# Patient Record
Sex: Female | Born: 1985 | Race: White | Hispanic: No | Marital: Single | Smoking: Former smoker
Health system: Southern US, Community
[De-identification: ages and names within clinical notes are randomized; demographics above are authoritative.]

## PROBLEM LIST (undated history)

## (undated) DIAGNOSIS — I10 Essential (primary) hypertension: Secondary | ICD-10-CM

## (undated) DIAGNOSIS — E119 Type 2 diabetes mellitus without complications: Secondary | ICD-10-CM

## (undated) DIAGNOSIS — J45909 Unspecified asthma, uncomplicated: Secondary | ICD-10-CM

## (undated) DIAGNOSIS — E739 Lactose intolerance, unspecified: Secondary | ICD-10-CM

## (undated) HISTORY — PX: TONSILLECTOMY: SUR1361

## (undated) HISTORY — DX: Lactose intolerance, unspecified: E73.9

---
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
## 2007-04-04 ENCOUNTER — Emergency Department: Payer: Self-pay | Admitting: Unknown Physician Specialty

## 2007-05-04 ENCOUNTER — Emergency Department: Payer: Self-pay | Admitting: Emergency Medicine

## 2007-05-31 ENCOUNTER — Emergency Department: Payer: Self-pay | Admitting: Emergency Medicine

## 2007-09-17 ENCOUNTER — Emergency Department: Payer: Self-pay | Admitting: Emergency Medicine

## 2007-10-18 ENCOUNTER — Emergency Department: Payer: Self-pay | Admitting: Internal Medicine

## 2008-05-06 ENCOUNTER — Emergency Department: Payer: Self-pay | Admitting: Internal Medicine

## 2008-06-14 ENCOUNTER — Emergency Department: Payer: Self-pay | Admitting: Emergency Medicine

## 2008-07-29 ENCOUNTER — Emergency Department: Payer: Self-pay | Admitting: Emergency Medicine

## 2008-10-19 ENCOUNTER — Emergency Department: Payer: Self-pay | Admitting: Emergency Medicine

## 2008-12-04 ENCOUNTER — Emergency Department: Payer: Self-pay | Admitting: Emergency Medicine

## 2009-01-20 ENCOUNTER — Emergency Department: Payer: Self-pay | Admitting: Emergency Medicine

## 2009-02-10 ENCOUNTER — Emergency Department (HOSPITAL_COMMUNITY): Admission: EM | Admit: 2009-02-10 | Discharge: 2009-02-11 | Payer: Self-pay | Admitting: Emergency Medicine

## 2012-02-29 ENCOUNTER — Emergency Department: Payer: Self-pay | Admitting: Emergency Medicine

## 2012-12-26 DIAGNOSIS — A6000 Herpesviral infection of urogenital system, unspecified: Secondary | ICD-10-CM | POA: Insufficient documentation

## 2012-12-26 DIAGNOSIS — Z98891 History of uterine scar from previous surgery: Secondary | ICD-10-CM | POA: Insufficient documentation

## 2012-12-26 DIAGNOSIS — I10 Essential (primary) hypertension: Secondary | ICD-10-CM | POA: Insufficient documentation

## 2012-12-26 DIAGNOSIS — F32A Depression, unspecified: Secondary | ICD-10-CM | POA: Insufficient documentation

## 2012-12-26 DIAGNOSIS — Z6834 Body mass index (BMI) 34.0-34.9, adult: Secondary | ICD-10-CM | POA: Insufficient documentation

## 2013-04-30 ENCOUNTER — Emergency Department: Payer: Self-pay | Admitting: Emergency Medicine

## 2013-04-30 LAB — COMPREHENSIVE METABOLIC PANEL
Anion Gap: 6 — ABNORMAL LOW (ref 7–16)
Chloride: 100 mmol/L (ref 98–107)
EGFR (African American): >60
Glucose: 370 mg/dL — ABNORMAL HIGH (ref 65–99)
Osmolality: 281 (ref 275–301)
SGOT(AST): 21 U/L (ref 15–37)
Sodium: 133 mmol/L — ABNORMAL LOW (ref 136–145)

## 2013-04-30 LAB — URINALYSIS, COMPLETE
Bilirubin,UR: NEGATIVE
Leukocyte Esterase: NEGATIVE
Protein: 30
Specific Gravity: 1.032 (ref 1.003–1.030)

## 2013-04-30 LAB — CBC
HCT: 42.4 % (ref 35.0–47.0)
MCH: 25.7 pg — ABNORMAL LOW (ref 26.0–34.0)
MCHC: 33 g/dL (ref 32.0–36.0)
MCV: 78 fL — ABNORMAL LOW (ref 80–100)

## 2013-11-13 ENCOUNTER — Emergency Department: Payer: Self-pay | Admitting: Emergency Medicine

## 2013-11-13 LAB — COMPREHENSIVE METABOLIC PANEL
ALBUMIN: 3.6 g/dL (ref 3.4–5.0)
ALT: 24 U/L (ref 12–78)
AST: 18 U/L (ref 15–37)
Alkaline Phosphatase: 69 U/L
Anion Gap: 7 (ref 7–16)
BILIRUBIN TOTAL: 0.5 mg/dL (ref 0.2–1.0)
BUN: 8 mg/dL (ref 7–18)
CO2: 25 mmol/L (ref 21–32)
Calcium, Total: 8.7 mg/dL (ref 8.5–10.1)
Chloride: 102 mmol/L (ref 98–107)
Creatinine: 0.79 mg/dL (ref 0.60–1.30)
EGFR (African American): >60
EGFR (Non-African Amer.): >60
Glucose: 334 mg/dL — ABNORMAL HIGH (ref 65–99)
Osmolality: 280 (ref 275–301)
Potassium: 3.7 mmol/L (ref 3.5–5.1)
Sodium: 134 mmol/L — ABNORMAL LOW (ref 136–145)
Total Protein: 7.6 g/dL (ref 6.4–8.2)

## 2013-11-13 LAB — URINALYSIS, COMPLETE
BILIRUBIN, UR: NEGATIVE
Bacteria: NONE SEEN
KETONE: NEGATIVE
Leukocyte Esterase: NEGATIVE
Nitrite: NEGATIVE
PH: 6 (ref 4.5–8.0)
RBC,UR: 1 /HPF (ref 0–5)
SPECIFIC GRAVITY: 1.032 (ref 1.003–1.030)
Squamous Epithelial: 1

## 2013-11-13 LAB — CBC WITH DIFFERENTIAL/PLATELET
BASOS PCT: 0.5 %
Basophil #: 0 10*3/uL (ref 0.0–0.1)
EOS ABS: 0.1 10*3/uL (ref 0.0–0.7)
Eosinophil %: 1.2 %
HCT: 43.4 % (ref 35.0–47.0)
HGB: 14.3 g/dL (ref 12.0–16.0)
Lymphocyte #: 1.8 10*3/uL (ref 1.0–3.6)
Lymphocyte %: 28.6 %
MCH: 26.5 pg (ref 26.0–34.0)
MCHC: 32.9 g/dL (ref 32.0–36.0)
MCV: 81 fL (ref 80–100)
Monocyte #: 0.4 x10 3/mm (ref 0.2–0.9)
Monocyte %: 6 %
NEUTROS ABS: 4 10*3/uL (ref 1.4–6.5)
Neutrophil %: 63.7 %
PLATELETS: 234 10*3/uL (ref 150–440)
RBC: 5.39 10*6/uL — ABNORMAL HIGH (ref 3.80–5.20)
RDW: 13.7 % (ref 11.5–14.5)
WBC: 6.2 10*3/uL (ref 3.6–11.0)

## 2013-11-13 LAB — PREGNANCY, URINE: Pregnancy Test, Urine: NEGATIVE m[IU]/mL

## 2014-03-01 ENCOUNTER — Emergency Department: Payer: Self-pay | Admitting: Emergency Medicine

## 2014-03-02 LAB — CBC
HCT: 39.8 % (ref 35.0–47.0)
HGB: 13.2 g/dL (ref 12.0–16.0)
MCH: 27.1 pg (ref 26.0–34.0)
MCHC: 33.2 g/dL (ref 32.0–36.0)
MCV: 82 fL (ref 80–100)
PLATELETS: 215 10*3/uL (ref 150–440)
RBC: 4.88 10*6/uL (ref 3.80–5.20)
RDW: 12.9 % (ref 11.5–14.5)
WBC: 7.8 10*3/uL (ref 3.6–11.0)

## 2014-03-02 LAB — COMPREHENSIVE METABOLIC PANEL
ALBUMIN: 3.3 g/dL — AB (ref 3.4–5.0)
ANION GAP: 8 (ref 7–16)
Alkaline Phosphatase: 49 U/L
BUN: 7 mg/dL (ref 7–18)
Bilirubin,Total: 0.3 mg/dL (ref 0.2–1.0)
CHLORIDE: 99 mmol/L (ref 98–107)
CREATININE: 0.71 mg/dL (ref 0.60–1.30)
Calcium, Total: 8.6 mg/dL (ref 8.5–10.1)
Co2: 27 mmol/L (ref 21–32)
EGFR (African American): >60
EGFR (Non-African Amer.): >60
Glucose: 331 mg/dL — ABNORMAL HIGH (ref 65–99)
Osmolality: 279 (ref 275–301)
Potassium: 3.9 mmol/L (ref 3.5–5.1)
SGOT(AST): 14 U/L — ABNORMAL LOW (ref 15–37)
SGPT (ALT): 18 U/L (ref 12–78)
Sodium: 134 mmol/L — ABNORMAL LOW (ref 136–145)
TOTAL PROTEIN: 6.8 g/dL (ref 6.4–8.2)

## 2014-03-02 LAB — URINALYSIS, COMPLETE
BILIRUBIN, UR: NEGATIVE
BLOOD: NEGATIVE
Bacteria: NONE SEEN
Glucose,UR: >=500 mg/dL (ref 0–75)
KETONE: NEGATIVE
Leukocyte Esterase: NEGATIVE
NITRITE: NEGATIVE
PH: 6 (ref 4.5–8.0)
PROTEIN: NEGATIVE
Specific Gravity: 1.035 (ref 1.003–1.030)
WBC UR: 2 /HPF (ref 0–5)

## 2014-03-02 LAB — ETHANOL

## 2014-03-02 LAB — PREGNANCY, URINE: Pregnancy Test, Urine: NEGATIVE m[IU]/mL

## 2014-03-02 LAB — LIPASE, BLOOD: LIPASE: 118 U/L (ref 73–393)

## 2014-07-04 ENCOUNTER — Emergency Department: Payer: Self-pay | Admitting: Emergency Medicine

## 2014-07-04 LAB — URINALYSIS, COMPLETE
Bacteria: NONE SEEN
Bilirubin,UR: NEGATIVE
Blood: NEGATIVE
Glucose,UR: >=500 mg/dL (ref 0–75)
KETONE: NEGATIVE
NITRITE: NEGATIVE
PH: 6 (ref 4.5–8.0)
Protein: 30
Specific Gravity: 1.03 (ref 1.003–1.030)
Squamous Epithelial: 4
WBC UR: 34 /HPF (ref 0–5)

## 2014-07-04 LAB — COMPREHENSIVE METABOLIC PANEL
ALK PHOS: 58 U/L
ALT: 29 U/L
AST: 22 U/L (ref 15–37)
Albumin: 3.6 g/dL (ref 3.4–5.0)
Anion Gap: 10 (ref 7–16)
BILIRUBIN TOTAL: 0.4 mg/dL (ref 0.2–1.0)
BUN: 9 mg/dL (ref 7–18)
CALCIUM: 8.8 mg/dL (ref 8.5–10.1)
CHLORIDE: 99 mmol/L (ref 98–107)
CO2: 24 mmol/L (ref 21–32)
Creatinine: 0.81 mg/dL (ref 0.60–1.30)
EGFR (African American): >60
EGFR (Non-African Amer.): >60
GLUCOSE: 489 mg/dL — AB (ref 65–99)
Osmolality: 287 (ref 275–301)
POTASSIUM: 4.3 mmol/L (ref 3.5–5.1)
SODIUM: 133 mmol/L — AB (ref 136–145)
TOTAL PROTEIN: 7.3 g/dL (ref 6.4–8.2)

## 2014-07-04 LAB — PREGNANCY, URINE: PREGNANCY TEST, URINE: NEGATIVE m[IU]/mL

## 2014-07-04 LAB — CBC
HCT: 43.3 % (ref 35.0–47.0)
HGB: 14 g/dL (ref 12.0–16.0)
MCH: 26.7 pg (ref 26.0–34.0)
MCHC: 32.2 g/dL (ref 32.0–36.0)
MCV: 83 fL (ref 80–100)
Platelet: 248 10*3/uL (ref 150–440)
RBC: 5.23 10*6/uL — AB (ref 3.80–5.20)
RDW: 12.9 % (ref 11.5–14.5)
WBC: 9.1 10*3/uL (ref 3.6–11.0)

## 2014-07-06 LAB — URINE CULTURE

## 2014-07-17 ENCOUNTER — Emergency Department: Payer: Self-pay | Admitting: Internal Medicine

## 2014-10-06 ENCOUNTER — Emergency Department: Payer: Self-pay | Admitting: Emergency Medicine

## 2014-11-15 ENCOUNTER — Emergency Department: Payer: Self-pay | Admitting: Emergency Medicine

## 2014-11-15 ENCOUNTER — Emergency Department: Payer: Self-pay | Admitting: Student

## 2015-04-20 ENCOUNTER — Emergency Department
Admission: EM | Admit: 2015-04-20 | Discharge: 2015-04-20 | Disposition: A | Discharge status: Home / Self Care | Payer: Self-pay | Attending: Emergency Medicine | Admitting: Emergency Medicine

## 2015-04-20 ENCOUNTER — Encounter: Payer: Self-pay | Admitting: Emergency Medicine

## 2015-04-20 ENCOUNTER — Emergency Department: Payer: Self-pay

## 2015-04-20 DIAGNOSIS — Z880 Allergy status to penicillin: Secondary | ICD-10-CM | POA: Insufficient documentation

## 2015-04-20 DIAGNOSIS — E119 Type 2 diabetes mellitus without complications: Secondary | ICD-10-CM | POA: Insufficient documentation

## 2015-04-20 DIAGNOSIS — Z720 Tobacco use: Secondary | ICD-10-CM | POA: Insufficient documentation

## 2015-04-20 DIAGNOSIS — J209 Acute bronchitis, unspecified: Secondary | ICD-10-CM | POA: Insufficient documentation

## 2015-04-20 HISTORY — DX: Type 2 diabetes mellitus without complications: E11.9

## 2015-04-20 MED ORDER — GUAIFENESIN-CODEINE 300-10 MG/5ML PO LIQD: 10.0000 mL | Freq: Three times a day (TID) | ORAL | Status: DC | PRN

## 2015-04-20 MED ORDER — ALBUTEROL SULFATE HFA 108 (90 BASE) MCG/ACT IN AERS: 2.0000 | INHALATION_SPRAY | Freq: Four times a day (QID) | RESPIRATORY_TRACT | Status: DC | PRN

## 2015-04-20 MED ORDER — PREDNISONE 20 MG PO TABS
60.0000 mg | ORAL_TABLET | Freq: Once | ORAL | Status: AC
  Administered 2015-04-20: 60 mg via ORAL
  Filled 2015-04-20: qty 3

## 2015-04-20 MED ORDER — IPRATROPIUM-ALBUTEROL 0.5-2.5 (3) MG/3ML IN SOLN
3.0000 mL | Freq: Once | RESPIRATORY_TRACT | Status: AC
  Administered 2015-04-20: 3 mL via RESPIRATORY_TRACT
  Filled 2015-04-20: qty 3

## 2015-04-20 MED ORDER — PREDNISONE 10 MG PO TABS: 50.0000 mg | ORAL_TABLET | Freq: Every day | ORAL | Status: DC

## 2015-04-20 NOTE — ED Notes (Signed)
Pt to ed with c/p cough, congestion since Sunday.  Pt states today much worse with difficulty breathing while doing activity. sats 97%.  Pt able to speak in full complete sentences.

## 2015-04-20 NOTE — ED Provider Notes (Signed)
[facility] Emergency Department Provider Note  ____________________________________________  Time seen: Approximately 2:23 PM  I have reviewed the triage vital signs and the nursing notes.   HISTORY  Chief Complaint Cough   HPI Alexa Dunn is a 29 y.o. female who presents to the emergency department for evaluation of cough, congestion, and wheezing since Sunday. She has asthma but has been out of her albuterol inhaler for quite some time. She states that her breathing gets worse with exertion.   Past Medical History  Diagnosis Date  . Diabetes mellitus without complication     There are no active problems to display for this patient.   History reviewed. No pertinent past surgical history.  Current Outpatient Rx  Name  Route  Sig  Dispense  Refill  . glipiZIDE (GLUCOTROL) 10 MG tablet   Oral   Take 10 mg by mouth daily before breakfast.         . metFORMIN (GLUCOPHAGE) 1000 MG tablet   Oral   Take 1,000 mg by mouth 2 (two) times daily with a meal.         . albuterol (PROVENTIL HFA;VENTOLIN HFA) 108 (90 BASE) MCG/ACT inhaler   Inhalation   Inhale 2 puffs into the lungs every 6 (six) hours as needed for wheezing or shortness of breath.   1 Inhaler   2   . Guaifenesin-Codeine 300-10 MG/5ML LIQD   Oral   Take 10 mLs by mouth 3 (three) times daily as needed.   120 mL   0   . predniSONE (DELTASONE) 10 MG tablet   Oral   Take 5 tablets (50 mg total) by mouth daily. Start on 04/21/2015   25 tablet   0     Allergies Penicillins  History reviewed. No pertinent family history.  Social History History  Substance Use Topics  . Smoking status: Current Every Day Smoker  . Smokeless tobacco: Not on file  . Alcohol Use: No    Review of Systems Constitutional: No fever/chills Eyes: No visual changes. ENT: No sore throat. Cardiovascular: Denies chest pain. Respiratory: Positive shortness of breath. Gastrointestinal: No abdominal  pain.  No nausea, no vomiting.  No diarrhea.  No constipation. Genitourinary: Negative for dysuria. Musculoskeletal: Negative for back pain. Skin: Negative for rash. Neurological: Negative for headaches, focal weakness or numbness.  10-point ROS otherwise negative.  ____________________________________________   PHYSICAL EXAM:  VITAL SIGNS: ED Triage Vitals  Enc Vitals Group     BP 04/20/15 1331 131/77 mmHg     Pulse Rate 04/20/15 1331 99     Resp 04/20/15 1331 20     Temp 04/20/15 1331 98.1 F (36.7 C)     Temp Source 04/20/15 1331 Oral     SpO2 04/20/15 1331 100 %     Weight 04/20/15 1331 215 lb (97.523 kg)     Height 04/20/15 1331 5\' 5"  (1.651 m)     Head Cir --      Peak Flow --      Pain Score 04/20/15 1332 4     Pain Loc --      Pain Edu? --      Excl. in GC? --     Constitutional: Alert and oriented. Well appearing and in no acute distress. Eyes: Conjunctivae are normal. PERRL. EOMI. Head: Atraumatic. Nose: No congestion/rhinnorhea. Mouth/Throat: Mucous membranes are moist.  Oropharynx non-erythematous. Neck: No stridor.   Cardiovascular: Normal rate, regular rhythm. Grossly normal heart sounds.  Good peripheral circulation.  Respiratory: Normal respiratory effort.  No retractions. Expiratory wheezes present in all fields. Gastrointestinal: Soft and nontender. No distention. No abdominal bruits. No CVA tenderness. Musculoskeletal: No lower extremity tenderness nor edema.  No joint effusions. Neurologic:  Normal speech and language. No gross focal neurologic deficits are appreciated. No gait instability. Skin:  Skin is warm, dry and intact. No rash noted. Psychiatric: Mood and affect are normal. Speech and behavior are normal.  ____________________________________________   LABS (all labs ordered are listed, but only abnormal results are displayed)  Labs Reviewed - No data to  display ____________________________________________  EKG   ____________________________________________  RADIOLOGY  X-rays x-ray negative for acute cardiopulmonary disease ____________________________________________   PROCEDURES  Procedure(s) performed: None  Critical Care performed: No  ____________________________________________   INITIAL IMPRESSION / ASSESSMENT AND PLAN / ED COURSE  Pertinent labs & imaging results that were available during my care of the patient were reviewed by me and considered in my medical decision making (see chart for details).  Patient was advised to schedule follow-up with primary care if possible. She was advised to return to the emergency department for symptoms that change or worsen if she is unable schedule an appointment. ____________________________________________   FINAL CLINICAL IMPRESSION(S) / ED DIAGNOSES  Final diagnoses:  Bronchitis, acute, with bronchospasm      Chinita Pester, FNP 04/20/15 1524  Sharman Cheek, MD 04/20/15 [phone]

## 2015-04-20 NOTE — Discharge Instructions (Signed)

## 2015-08-26 ENCOUNTER — Emergency Department: Payer: Self-pay

## 2015-08-26 ENCOUNTER — Emergency Department
Admission: EM | Admit: 2015-08-26 | Discharge: 2015-08-26 | Disposition: A | Discharge status: Home / Self Care | Payer: Self-pay | Attending: Emergency Medicine | Admitting: Emergency Medicine

## 2015-08-26 ENCOUNTER — Encounter: Payer: Self-pay | Admitting: Emergency Medicine

## 2015-08-26 DIAGNOSIS — Z3202 Encounter for pregnancy test, result negative: Secondary | ICD-10-CM | POA: Insufficient documentation

## 2015-08-26 DIAGNOSIS — Z7952 Long term (current) use of systemic steroids: Secondary | ICD-10-CM | POA: Insufficient documentation

## 2015-08-26 DIAGNOSIS — Z7984 Long term (current) use of oral hypoglycemic drugs: Secondary | ICD-10-CM | POA: Insufficient documentation

## 2015-08-26 DIAGNOSIS — F1410 Cocaine abuse, uncomplicated: Secondary | ICD-10-CM | POA: Insufficient documentation

## 2015-08-26 DIAGNOSIS — E119 Type 2 diabetes mellitus without complications: Secondary | ICD-10-CM | POA: Insufficient documentation

## 2015-08-26 DIAGNOSIS — F1210 Cannabis abuse, uncomplicated: Secondary | ICD-10-CM | POA: Insufficient documentation

## 2015-08-26 DIAGNOSIS — R1031 Right lower quadrant pain: Secondary | ICD-10-CM | POA: Insufficient documentation

## 2015-08-26 DIAGNOSIS — R112 Nausea with vomiting, unspecified: Secondary | ICD-10-CM | POA: Insufficient documentation

## 2015-08-26 DIAGNOSIS — Z79899 Other long term (current) drug therapy: Secondary | ICD-10-CM | POA: Insufficient documentation

## 2015-08-26 DIAGNOSIS — F17200 Nicotine dependence, unspecified, uncomplicated: Secondary | ICD-10-CM | POA: Insufficient documentation

## 2015-08-26 DIAGNOSIS — Z880 Allergy status to penicillin: Secondary | ICD-10-CM | POA: Insufficient documentation

## 2015-08-26 LAB — COMPREHENSIVE METABOLIC PANEL
ALT: 21 U/L (ref 14–54)
AST: 20 U/L (ref 15–41)
Albumin: 4.2 g/dL (ref 3.5–5.0)
Alkaline Phosphatase: 48 U/L (ref 38–126)
Anion gap: 10 (ref 5–15)
BUN: 6 mg/dL (ref 6–20)
CHLORIDE: 99 mmol/L — AB (ref 101–111)
CO2: 22 mmol/L (ref 22–32)
CREATININE: 0.61 mg/dL (ref 0.44–1.00)
Calcium: 9 mg/dL (ref 8.9–10.3)
GFR calc Af Amer: >60 mL/min (ref >60)
Glucose, Bld: 341 mg/dL — ABNORMAL HIGH (ref 65–99)
Potassium: 3.6 mmol/L (ref 3.5–5.1)
SODIUM: 131 mmol/L — AB (ref 135–145)
Total Bilirubin: 0.9 mg/dL (ref 0.3–1.2)
Total Protein: 7.6 g/dL (ref 6.5–8.1)

## 2015-08-26 LAB — LIPASE, BLOOD: Lipase: 30 U/L (ref 11–51)

## 2015-08-26 LAB — URINE DRUG SCREEN, QUALITATIVE (ARMC ONLY)
AMPHETAMINES, UR SCREEN: NOT DETECTED
Barbiturates, Ur Screen: NOT DETECTED
Benzodiazepine, Ur Scrn: NOT DETECTED
CANNABINOID 50 NG, UR ~~LOC~~: POSITIVE — AB
COCAINE METABOLITE, UR ~~LOC~~: POSITIVE — AB
MDMA (ECSTASY) UR SCREEN: NOT DETECTED
METHADONE SCREEN, URINE: NOT DETECTED
Opiate, Ur Screen: NOT DETECTED
Phencyclidine (PCP) Ur S: NOT DETECTED
TRICYCLIC, UR SCREEN: NOT DETECTED

## 2015-08-26 LAB — CBC WITH DIFFERENTIAL/PLATELET
Basophils Absolute: 0 10*3/uL (ref 0–0.1)
Basophils Relative: 1 %
EOS PCT: 1 %
Eosinophils Absolute: 0.1 10*3/uL (ref 0–0.7)
HCT: 45.7 % (ref 35.0–47.0)
Hemoglobin: 15 g/dL (ref 12.0–16.0)
LYMPHS ABS: 2.4 10*3/uL (ref 1.0–3.6)
LYMPHS PCT: 28 %
MCH: 26.5 pg (ref 26.0–34.0)
MCHC: 32.9 g/dL (ref 32.0–36.0)
MCV: 80.5 fL (ref 80.0–100.0)
MONO ABS: 0.5 10*3/uL (ref 0.2–0.9)
Monocytes Relative: 5 %
Neutro Abs: 5.8 10*3/uL (ref 1.4–6.5)
Neutrophils Relative %: 65 %
PLATELETS: 266 10*3/uL (ref 150–440)
RBC: 5.67 MIL/uL — ABNORMAL HIGH (ref 3.80–5.20)
RDW: 13.1 % (ref 11.5–14.5)
WBC: 8.8 10*3/uL (ref 3.6–11.0)

## 2015-08-26 LAB — URINALYSIS COMPLETE WITH MICROSCOPIC (ARMC ONLY)
BACTERIA UA: NONE SEEN
Bilirubin Urine: NEGATIVE
Hgb urine dipstick: NEGATIVE
Ketones, ur: NEGATIVE mg/dL
Leukocytes, UA: NEGATIVE
Nitrite: NEGATIVE
PROTEIN: 100 mg/dL — AB
SPECIFIC GRAVITY, URINE: 1.035 — AB (ref 1.005–1.030)
pH: 6 (ref 5.0–8.0)

## 2015-08-26 LAB — POCT PREGNANCY, URINE: Preg Test, Ur: NEGATIVE

## 2015-08-26 MED ORDER — HYDROCODONE-ACETAMINOPHEN 5-325 MG PO TABS: 1.0000 | ORAL_TABLET | ORAL | Status: DC | PRN

## 2015-08-26 MED ORDER — MORPHINE SULFATE (PF) 4 MG/ML IV SOLN
4.0000 mg | Freq: Once | INTRAVENOUS | Status: AC
  Administered 2015-08-26: 4 mg via INTRAVENOUS
  Filled 2015-08-26: qty 1

## 2015-08-26 MED ORDER — INSULIN ASPART 100 UNIT/ML ~~LOC~~ SOLN
4.0000 [IU] | Freq: Once | SUBCUTANEOUS | Status: AC
  Administered 2015-08-26: 4 [IU] via INTRAVENOUS
  Filled 2015-08-26: qty 4

## 2015-08-26 MED ORDER — SODIUM CHLORIDE 0.9 % IV BOLUS (SEPSIS)
1000.0000 mL | Freq: Once | INTRAVENOUS | Status: AC
  Administered 2015-08-26: 1000 mL via INTRAVENOUS

## 2015-08-26 MED ORDER — IOHEXOL 240 MG/ML SOLN
25.0000 mL | Freq: Once | INTRAMUSCULAR | Status: AC | PRN
  Administered 2015-08-26: 25 mL via ORAL

## 2015-08-26 MED ORDER — ONDANSETRON HCL 4 MG/2ML IJ SOLN
4.0000 mg | Freq: Once | INTRAMUSCULAR | Status: AC
  Administered 2015-08-26: 4 mg via INTRAVENOUS
  Filled 2015-08-26: qty 2

## 2015-08-26 MED ORDER — IOHEXOL 300 MG/ML  SOLN
100.0000 mL | Freq: Once | INTRAMUSCULAR | Status: AC | PRN
  Administered 2015-08-26: 100 mL via INTRAVENOUS

## 2015-08-26 NOTE — ED Notes (Signed)
rlq pain since 1am -

## 2015-08-26 NOTE — ED Notes (Signed)
Negative urine preg.

## 2015-08-26 NOTE — ED Provider Notes (Signed)
[facility] Emergency Department Provider Note  Time seen: 4:52 PM  I have reviewed the triage vital signs and the nursing notes.   HISTORY  Chief Complaint Abdominal Pain    HPI Alexa Dunn is a 29 y.o. female with a past medical history of diabetes who presents the emergency department for right lower quadrant abdominal pain since this morning. Went to the patient around 1 AM she developed acute onset sharp right lower quadrant abdominal pain which she ranks as a 10/10. States nausea and vomiting, denies diarrhea, black or bloody stool, dysuria, urine frequency. Patient states she has an Implanon birth control.Denies vaginal discharge.     Past Medical History  Diagnosis Date  . Diabetes mellitus without complication (HCC)     There are no active problems to display for this patient.   History reviewed. No pertinent past surgical history.  Current Outpatient Rx  Name  Route  Sig  Dispense  Refill  . albuterol (PROVENTIL HFA;VENTOLIN HFA) 108 (90 BASE) MCG/ACT inhaler   Inhalation   Inhale 2 puffs into the lungs every 6 (six) hours as needed for wheezing or shortness of breath.   1 Inhaler   2   . glipiZIDE (GLUCOTROL) 10 MG tablet   Oral   Take 10 mg by mouth daily before breakfast.         . Guaifenesin-Codeine 300-10 MG/5ML LIQD   Oral   Take 10 mLs by mouth 3 (three) times daily as needed.   120 mL   0   . metFORMIN (GLUCOPHAGE) 1000 MG tablet   Oral   Take 1,000 mg by mouth 2 (two) times daily with a meal.         . predniSONE (DELTASONE) 10 MG tablet   Oral   Take 5 tablets (50 mg total) by mouth daily. Start on 04/21/2015   25 tablet   0     Allergies Penicillins  History reviewed. No pertinent family history.  Social History Social History  Substance Use Topics  . Smoking status: Current Every Day Smoker -- 1.00 packs/day  . Smokeless tobacco: None  . Alcohol Use: No    Review of Systems Constitutional:  Negative for fever. Cardiovascular: Negative for chest pain. Respiratory: Negative for shortness of breath. Gastrointestinal: Right lower quadrant abdominal pain, negative for diarrhea. Positive for nausea and vomiting Genitourinary: Negative for dysuria. Musculoskeletal: Negative for back pain. Neurological: Negative for headaches 10-point ROS otherwise negative.  ____________________________________________   PHYSICAL EXAM:  VITAL SIGNS: ED Triage Vitals  Enc Vitals Group     BP 08/26/15 1647 165/117 mmHg     Pulse Rate 08/26/15 1647 93     Resp 08/26/15 1647 18     Temp 08/26/15 1647 97.4 F (36.3 C)     Temp src --      SpO2 08/26/15 1647 100 %     Weight 08/26/15 1647 200 lb (90.719 kg)     Height 08/26/15 1647 5\' 5"  (1.651 m)     Head Cir --      Peak Flow --      Pain Score 08/26/15 1649 10     Pain Loc --      Pain Edu? --      Excl. in GC? --     Constitutional: Alert and oriented. Well appearing and in no distress. Eyes: Normal exam ENT   Head: Normocephalic and atraumatic   Mouth/Throat: Mucous membranes are moist. Cardiovascular: Normal rate, regular rhythm.  No murmur Respiratory: Normal respiratory effort without tachypnea nor retractions. Breath sounds are clear and equal bilaterally. No wheezes/rales/rhonchi. Gastrointestinal: Soft, moderate right side into right lower quadrant abdominal tenderness. No rebound or guarding. No distention. No CVA tenderness. Musculoskeletal: Nontender with normal range of motion in all extremities.  Neurologic:  Normal speech and language. No gross focal neurologic deficits Skin:  Skin is warm, dry and intact.  Psychiatric: Mood and affect are normal. Speech and behavior are normal.  ____________________________________________   RADIOLOGY  CT abdomen and pelvis shows no abnormality  ____________________________________________    INITIAL IMPRESSION / ASSESSMENT AND PLAN / ED COURSE  Pertinent labs &  imaging results that were available during my care of the patient were reviewed by me and considered in my medical decision making (see chart for details).  Patient possessed emergency department with acute onset of right lower quadrant pain since 1 AM. Positive nausea and vomiting, denies diarrhea, dysuria, hematuria or vaginal discharge. We will check labs, IV hydrate, treat pain and nausea, and closely monitor in the emergency department.  CT abdomen/pelvis shows no acute abnormality. Patient denies being at risk for STDs, refusing pelvic exam currently. Patient states she'll follow up with her primary care doctor. I discussed strict return precautions for worsening abdominal pain or fever, the patient is agreeable.  ____________________________________________   FINAL CLINICAL IMPRESSION(S) / ED DIAGNOSES  Right lower quadrant Abdominal pain   Minna Antis, MD 08/26/15 2036

## 2015-08-26 NOTE — Discharge Instructions (Signed)

## 2015-12-04 ENCOUNTER — Emergency Department
Admission: EM | Admit: 2015-12-04 | Discharge: 2015-12-04 | Disposition: A | Discharge status: Home / Self Care | Payer: Self-pay | Attending: Emergency Medicine | Admitting: Emergency Medicine

## 2015-12-04 ENCOUNTER — Encounter: Payer: Self-pay | Admitting: Emergency Medicine

## 2015-12-04 DIAGNOSIS — Z79899 Other long term (current) drug therapy: Secondary | ICD-10-CM | POA: Insufficient documentation

## 2015-12-04 DIAGNOSIS — B9789 Other viral agents as the cause of diseases classified elsewhere: Secondary | ICD-10-CM

## 2015-12-04 DIAGNOSIS — J069 Acute upper respiratory infection, unspecified: Secondary | ICD-10-CM | POA: Insufficient documentation

## 2015-12-04 DIAGNOSIS — J988 Other specified respiratory disorders: Secondary | ICD-10-CM

## 2015-12-04 DIAGNOSIS — E119 Type 2 diabetes mellitus without complications: Secondary | ICD-10-CM | POA: Insufficient documentation

## 2015-12-04 DIAGNOSIS — Z7984 Long term (current) use of oral hypoglycemic drugs: Secondary | ICD-10-CM | POA: Insufficient documentation

## 2015-12-04 DIAGNOSIS — F17200 Nicotine dependence, unspecified, uncomplicated: Secondary | ICD-10-CM | POA: Insufficient documentation

## 2015-12-04 LAB — RAPID INFLUENZA A&B ANTIGENS (ARMC ONLY): INFLUENZA A (ARMC): NEGATIVE

## 2015-12-04 LAB — RAPID INFLUENZA A&B ANTIGENS: Influenza B (ARMC): NEGATIVE

## 2015-12-04 MED ORDER — GUAIFENESIN-CODEINE 100-10 MG/5ML PO SOLN: 5.0000 mL | ORAL | Status: DC | PRN

## 2015-12-04 NOTE — ED Provider Notes (Signed)
[facility] Emergency Department Provider Note  ____________________________________________  Time seen: Approximately 2:05 PM  I have reviewed the triage vital signs and the nursing notes.   HISTORY  Chief Complaint Fever and Cough   HPI Alexa Dunn is a 30 y.o. female is here with complaint of fever, chills and cough for 3 days. Patient has not taken any over-the-counter medication. She states that her throat only hurts from coughing. She denies any ear pain, nausea, vomiting or diarrhea. Patient complains of generalized body aches along with slight headache. Currently she rates her pain is 7 out of 10. 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   Past Medical History  Diagnosis Date  . Diabetes mellitus without complication (HCC)     There are no active problems to display for this patient.   History reviewed. No pertinent past surgical history.  Current Outpatient Rx  Name  Route  Sig  Dispense  Refill  . albuterol (PROVENTIL HFA;VENTOLIN HFA) 108 (90 BASE) MCG/ACT inhaler   Inhalation   Inhale 2 puffs into the lungs every 6 (six) hours as needed for wheezing or shortness of breath.   1 Inhaler   2   . glipiZIDE (GLUCOTROL) 10 MG tablet   Oral   Take 10 mg by mouth daily before breakfast.         . guaiFENesin-codeine 100-10 MG/5ML syrup   Oral   Take 5 mLs by mouth every 4 (four) hours as needed.   120 mL   0   . metFORMIN (GLUCOPHAGE) 1000 MG tablet   Oral   Take 500 mg by mouth 2 (two) times daily with a meal.            Allergies Penicillins  No family history on file.  Social History Social History  Substance Use Topics  . Smoking status: Current Every Day Smoker -- 1.00 packs/day  . Smokeless tobacco: None  . Alcohol Use: No    Review of Systems Constitutional: Positive fever/chills Eyes: No visual changes. ENT: No sore throat. Cardiovascular: Denies chest pain. Respiratory: Denies shortness  of breath. Positive cough Gastrointestinal: No abdominal pain.  No nausea, no vomiting.  No diarrhea.  No constipation. Genitourinary: Negative for dysuria. Musculoskeletal: Positive generalized body aches. Skin: Negative for rash. Neurological: Negative for headaches, focal weakness or numbness.  10-point ROS otherwise negative.  ____________________________________________   PHYSICAL EXAM:  VITAL SIGNS: ED Triage Vitals  Enc Vitals Group     BP 12/04/15 1337 137/75 mmHg     Pulse Rate 12/04/15 1337 99     Resp 12/04/15 1337 18     Temp 12/04/15 1337 98.4 F (36.9 C)     Temp Source 12/04/15 1337 Oral     SpO2 12/04/15 1337 98 %     Weight 12/04/15 1337 194 lb (87.998 kg)     Height 12/04/15 1337  (1.651 m)     Head Cir --      Peak Flow --      Pain Score 12/04/15 1337 7     Pain Loc --      Pain Edu? --      Excl. in GC? --     Constitutional: Alert and oriented. Well appearing and in no acute distress. Eyes: Conjunctivae are normal. PERRL. EOMI. Head: Atraumatic. Nose: Moderate congestion/rhinnorhea. TMs are dull bilaterally. Mouth/Throat: Mucous membranes are moist.  Oropharynx non-erythematous. Neck: No stridor.   Hematological/Lymphatic/Immunilogical: No cervical lymphadenopathy. Cardiovascular:  Normal rate, regular rhythm. Grossly normal heart sounds.  Good peripheral circulation. Respiratory: Normal respiratory effort.  No retractions. Lungs CTAB. Gastrointestinal: Soft and nontender. No distention.  Musculoskeletal: His upper and lower extremities without any difficulty. Neurologic:  Normal speech and language. No gross focal neurologic deficits are appreciated. No gait instability. Skin:  Skin is warm, dry and intact. No rash noted. Psychiatric: Mood and affect are normal. Speech and behavior are normal.  ____________________________________________   LABS (all labs ordered are listed, but only abnormal results are displayed)  Labs Reviewed   RAPID INFLUENZA A&B ANTIGENS ([facility] ONLY)     PROCEDURES  Procedure(s) performed: None  Critical Care performed: No  ____________________________________________   INITIAL IMPRESSION / ASSESSMENT AND PLAN / ED COURSE  Pertinent labs & imaging results that were available during my care of the patient were reviewed by me and considered in my medical decision making (see chart for details).  She was made aware that her flu swab was negative. She is given a prescription for Robitussin-AC as needed for cough and congestion. She is continue Tylenol or Motrin as needed for fever and body aches. She is also given a note for work. ____________________________________________   FINAL CLINICAL IMPRESSION(S) / ED DIAGNOSES  Final diagnoses:  Viral respiratory illness      Tommi RumpsRhonda L Ophie Burrowes, PA-C 12/04/15 1452  Rockne MenghiniAnne-Caroline Norman, MD 12/04/15 1654

## 2015-12-04 NOTE — ED Notes (Signed)
Fever, cough, chills for about 3 days now.

## 2015-12-04 NOTE — Discharge Instructions (Signed)
Viral Infections A virus is a type of germ. Viruses can cause:  Minor sore throats.  Aches and pains.  Headaches.  Runny nose.  Rashes.  Watery eyes.  Tiredness.  Coughs.  Loss of appetite.  Feeling sick to your stomach (nausea).  Throwing up (vomiting).  Watery poop (diarrhea). HOME CARE   Only take medicines as told by your doctor.  Drink enough water and fluids to keep your pee (urine) clear or pale yellow. Sports drinks are a good choice.  Get plenty of rest and eat healthy. Soups and broths with crackers or rice are fine. GET HELP RIGHT AWAY IF:   You have a very bad headache.  You have shortness of breath.  You have chest pain or neck pain.  You have an unusual rash.  You cannot stop throwing up.  You have watery poop that does not stop.  You cannot keep fluids down.  You or your child has a temperature by mouth above 102 F (38.9 C), not controlled by medicine.  Your baby is older than 3 months with a rectal temperature of 102 F (38.9 C) or higher.  Your baby is 583 months old or younger with a rectal temperature of 100.4 F (38 C) or higher. MAKE SURE YOU:   Understand these instructions.  Will watch this condition.  Will get help right away if you are not doing well or get worse.   This information is not intended to replace advice given to you by your health care provider. Make sure you discuss any questions you have with your health care provider.   Document Released: 08/10/2008 Document Revised: 11/20/2011 Document Reviewed: 02/03/2015 Elsevier Interactive Patient Education [employer]2016 [employer]    Follow-up with your doctor if any continued problems. Continue fluids frequently. Tylenol or ibuprofen as needed for pain, aches or fever. Robitussin-AC is for cough. This does contain a narcotic and should not be taken while driving.

## 2016-04-25 ENCOUNTER — Emergency Department
Admission: EM | Admit: 2016-04-25 | Discharge: 2016-04-25 | Disposition: A | Discharge status: Home / Self Care | Payer: Self-pay | Attending: Emergency Medicine | Admitting: Emergency Medicine

## 2016-04-25 ENCOUNTER — Emergency Department: Payer: Self-pay

## 2016-04-25 ENCOUNTER — Encounter: Payer: Self-pay | Admitting: Emergency Medicine

## 2016-04-25 DIAGNOSIS — R197 Diarrhea, unspecified: Secondary | ICD-10-CM | POA: Insufficient documentation

## 2016-04-25 DIAGNOSIS — R110 Nausea: Secondary | ICD-10-CM | POA: Insufficient documentation

## 2016-04-25 DIAGNOSIS — Z7984 Long term (current) use of oral hypoglycemic drugs: Secondary | ICD-10-CM | POA: Insufficient documentation

## 2016-04-25 DIAGNOSIS — F17200 Nicotine dependence, unspecified, uncomplicated: Secondary | ICD-10-CM | POA: Insufficient documentation

## 2016-04-25 DIAGNOSIS — J069 Acute upper respiratory infection, unspecified: Secondary | ICD-10-CM | POA: Insufficient documentation

## 2016-04-25 DIAGNOSIS — B9789 Other viral agents as the cause of diseases classified elsewhere: Secondary | ICD-10-CM

## 2016-04-25 DIAGNOSIS — E119 Type 2 diabetes mellitus without complications: Secondary | ICD-10-CM | POA: Insufficient documentation

## 2016-04-25 LAB — BASIC METABOLIC PANEL
Anion gap: 7 (ref 5–15)
BUN: 9 mg/dL (ref 6–20)
CHLORIDE: 100 mmol/L — AB (ref 101–111)
CO2: 25 mmol/L (ref 22–32)
CREATININE: 0.59 mg/dL (ref 0.44–1.00)
Calcium: 9.4 mg/dL (ref 8.9–10.3)
GFR calc non Af Amer: >60 mL/min (ref >60)
Glucose, Bld: 320 mg/dL — ABNORMAL HIGH (ref 65–99)
POTASSIUM: 4.1 mmol/L (ref 3.5–5.1)
SODIUM: 132 mmol/L — AB (ref 135–145)

## 2016-04-25 LAB — URINALYSIS COMPLETE WITH MICROSCOPIC (ARMC ONLY)
Bilirubin Urine: NEGATIVE
Ketones, ur: NEGATIVE mg/dL
Leukocytes, UA: NEGATIVE
NITRITE: NEGATIVE
PH: 6 (ref 5.0–8.0)
PROTEIN: 100 mg/dL — AB
SPECIFIC GRAVITY, URINE: 1.031 — AB (ref 1.005–1.030)

## 2016-04-25 LAB — CBC WITH DIFFERENTIAL/PLATELET
Basophils Absolute: 0 10*3/uL (ref 0–0.1)
Basophils Relative: 1 %
EOS ABS: 0.1 10*3/uL (ref 0–0.7)
Eosinophils Relative: 1 %
HCT: 42.6 % (ref 35.0–47.0)
HEMOGLOBIN: 14.3 g/dL (ref 12.0–16.0)
LYMPHS ABS: 1.4 10*3/uL (ref 1.0–3.6)
LYMPHS PCT: 21 %
MCH: 26.8 pg (ref 26.0–34.0)
MCHC: 33.6 g/dL (ref 32.0–36.0)
MCV: 79.7 fL — ABNORMAL LOW (ref 80.0–100.0)
Monocytes Absolute: 0.5 10*3/uL (ref 0.2–0.9)
Monocytes Relative: 8 %
NEUTROS PCT: 69 %
Neutro Abs: 4.7 10*3/uL (ref 1.4–6.5)
Platelets: 226 10*3/uL (ref 150–440)
RBC: 5.35 MIL/uL — AB (ref 3.80–5.20)
RDW: 13 % (ref 11.5–14.5)
WBC: 6.8 10*3/uL (ref 3.6–11.0)

## 2016-04-25 LAB — GLUCOSE, CAPILLARY
GLUCOSE-CAPILLARY: 270 mg/dL — AB (ref 65–99)
Glucose-Capillary: 300 mg/dL — ABNORMAL HIGH (ref 65–99)

## 2016-04-25 MED ORDER — BENZONATATE 100 MG PO CAPS: 100.0000 mg | ORAL_CAPSULE | Freq: Three times a day (TID) | ORAL | 0 refills | Status: DC | PRN

## 2016-04-25 MED ORDER — ALBUTEROL SULFATE HFA 108 (90 BASE) MCG/ACT IN AERS: 1.0000 | INHALATION_SPRAY | Freq: Four times a day (QID) | RESPIRATORY_TRACT | 0 refills | Status: DC | PRN

## 2016-04-25 MED ORDER — SODIUM CHLORIDE 0.9 % IV BOLUS (SEPSIS)
1000.0000 mL | Freq: Once | INTRAVENOUS | Status: AC
  Administered 2016-04-25: 1000 mL via INTRAVENOUS

## 2016-04-25 MED ORDER — FLUTICASONE PROPIONATE 50 MCG/ACT NA SUSP: 2.0000 | Freq: Every day | NASAL | 0 refills | Status: DC

## 2016-04-25 NOTE — Discharge Instructions (Signed)
Please take your medications daily.  Keep your appointment with your PCP this week.

## 2016-04-25 NOTE — ED Notes (Signed)
Patient transported to X-ray 

## 2016-04-25 NOTE — ED Triage Notes (Signed)
Pt complains of body aches, cough, congestion that started on Sunday. Pt complains of decreased appetite and states she did have episodes of diarrhea yesterday.

## 2016-04-25 NOTE — ED Notes (Signed)
BS 300.

## 2016-04-25 NOTE — ED Notes (Signed)
States she developed chest cold sx's this past weekend  Cough  Body  Possible fever and decreased appetite

## 2016-04-25 NOTE — ED Provider Notes (Signed)
[facility] Emergency Department Provider Note  ____________________________________________  Time seen: Approximately 3:08 PM  I have reviewed the triage vital signs and the nursing notes.   HISTORY  Chief Complaint Cough    HPI Alexa Dunn is a 30 y.o. female, NAD, presents to the emergency department with 2 day history of cough and subjective fevers. She notes increased subjective fever, chills, cough, nasal congestion, runny nose, nausea, diarrhea, headache, increased fatigue, and decreased appetite over the last 48 hours. She denies abdominal pain, vomiting, changes in cognition, blurred vision, or hemoptysis. She also denies recent travel. Patient has PMHx significant for DMII. Patient states that about a month ago her PCP took her off Metformin due to GI side effects and placed her on 16 units nighty of Levemir. States that she did not take her Levemir last night due to feeling ill. She has not been able to check her blood glucose for the past week due to losing her home glucometer. Patient is experiencing 7/10 pain when she coughs. She has tried tylenol cold/flu last night without symptom relief. Has not taken anything today for her symptoms. Patient has used an albuterol inhaler for coughing and wheezing in the past but states her prescription ran out. Has an appointment with her PCP on 04/30/2016.   Past Medical History:  Diagnosis Date  . Diabetes mellitus without complication (HCC)     There are no active problems to display for this patient.   Past Surgical History:  Procedure Laterality Date  . TONSILLECTOMY      Prior to Admission medications   Medication Sig Start Date End Date Taking? Authorizing Provider  albuterol (PROVENTIL HFA;VENTOLIN HFA) 108 (90 Base) MCG/ACT inhaler Inhale 1-2 puffs into the lungs every 6 (six) hours as needed for wheezing or shortness of breath. 04/25/16   Trevin Gartrell L Raja Caputi, PA-C  benzonatate (TESSALON PERLES) 100 MG  capsule Take 1 capsule (100 mg total) by mouth 3 (three) times daily as needed for cough. 04/25/16   Shawntell Dixson L Cooper Moroney, PA-C  fluticasone (FLONASE) 50 MCG/ACT nasal spray Place 2 sprays into both nostrils daily. 04/25/16   Emersyn Kotarski L Trevione Wert, PA-C  glipiZIDE (GLUCOTROL) 10 MG tablet Take 10 mg by mouth daily before breakfast.    Historical Provider, MD  metFORMIN (GLUCOPHAGE) 1000 MG tablet Take 500 mg by mouth 2 (two) times daily with a meal.     Historical Provider, MD    Allergies Penicillins  No family history on file.  Social History Social History  Substance Use Topics  . Smoking status: Current Every Day Smoker    Packs/day: 1.00  . Smokeless tobacco: Never Used  . Alcohol use No     Review of Systems  Constitutional:  Positive subjective fever, chills, decreased appetite, fatigue.  Eyes: No visual changes. No discharge, redness, pain ENT: Positive sore throat, ear pressure, nasal congestion, runny nose. Denies ear pain, ear discharge, sinus pressure Cardiovascular: Chest pain with coughing Respiratory: Positive cough and shortness of breath. Productive with white sputum Gastrointestinal: Positive nausea, diarrhea. No abdominal pain, vomiting.  Genitourinary: Negative for dysuria, hematuria.  Musculoskeletal: Negative for back pain.  Skin: Negative for rash. Neurological: Positive sinus headache No focal weakness or numbness. 10-point ROS otherwise negative.  ____________________________________________   PHYSICAL EXAM:  VITAL SIGNS: ED Triage Vitals  Enc Vitals Group     BP 04/25/16 1432 (!) 144/88     Pulse Rate 04/25/16 1432 96     Resp 04/25/16 1432 16  Temp 04/25/16 1432 98.4 F (36.9 C)     Temp Source 04/25/16 1432 Oral     SpO2 04/25/16 1432 98 %     Weight 04/25/16 1434 190 lb (86.2 kg)     Height 04/25/16 1434 5\' 5"  (1.651 m)     Head Circumference --      Peak Flow --      Pain Score 04/25/16 1440 7     Pain Loc --      Pain Edu? --      Excl. in GC?  --      Constitutional: Alert and oriented. Well appearing and in no acute distress. Eyes: Conjunctivae are normal without icterus or injection Head: Atraumatic. ENT:      Ears: Retracted tympanic membranes bilaterally without erythema, effusion, perforation.       Nose: Mild congestion with trace clear rhinorrhea. Turbinates are injected      Mouth/Throat: Mucous membranes are moist. Pharynx without erythema, swelling, exudate. Clear postnasal drip. Uvula is midline. Airway is patent.  Neck: Supple with full range of motion.  Hematological/Lymphatic/Immunilogical: No cervical lymphadenopathy. Cardiovascular: Normal rate, regular rhythm. Normal S1 and S2.  No murmurs, rubs, gallops. Good peripheral circulation with 2+ pulses noted in the upper extremities. Respiratory: Normal respiratory effort without tachypnea or retractions. Lungs are CTAB with breath sounds noted in all lung fields. No wheeze, rhonchi, raales Musculoskeletal: No lower extremity tenderness nor edema.  No joint effusions. Neurologic:  Normal speech and language. No gross focal neurologic deficits are appreciated.  Skin:  Skin is warm, dry and intact. No rash noted. Psychiatric: Mood and affect are normal. Speech and behavior are normal. Patient exhibits appropriate insight and judgement.   ____________________________________________   LABS (all labs ordered are listed, but only abnormal results are displayed)  Labs Reviewed  GLUCOSE, CAPILLARY - Abnormal; Notable for the following:       Result Value   Glucose-Capillary 300 (*)    All other components within normal limits  URINALYSIS COMPLETEWITH MICROSCOPIC ([facility] ONLY) - Abnormal; Notable for the following:    Color, Urine YELLOW (*)    APPearance HAZY (*)    Glucose, UA >500 (*)    Specific Gravity, Urine 1.031 (*)    Hgb urine dipstick 3+ (*)    Protein, ur 100 (*)    Bacteria, UA RARE (*)    Squamous Epithelial / LPF 6-30 (*)    All other components  within normal limits  BASIC METABOLIC PANEL - Abnormal; Notable for the following:    Sodium 132 (*)    Chloride 100 (*)    Glucose, Bld 320 (*)    All other components within normal limits  CBC WITH DIFFERENTIAL/PLATELET - Abnormal; Notable for the following:    RBC 5.35 (*)    MCV 79.7 (*)    All other components within normal limits  CBG MONITORING, ED   ____________________________________________  EKG  None ____________________________________________  RADIOLOGY I have personally viewed and evaluated these images (plain radiographs) as part of my medical decision making, as well as reviewing the written report by the radiologist.  Dg Chest 2 View  Result Date: 04/25/2016 CLINICAL DATA:  Cough and fever EXAM: CHEST  2 VIEW COMPARISON:  April 20, 2015 FINDINGS: Lungs are clear. Heart size and pulmonary vascularity are normal. No adenopathy. No bone lesions. IMPRESSION: No edema or consolidation. Electronically Signed   By: Bretta BangWilliam  Woodruff III M.D.   On: 04/25/2016 15:56    ____________________________________________  PROCEDURES  Procedure(s) performed: None   Procedures   Medications  sodium chloride 0.9 % bolus 1,000 mL (1,000 mLs Intravenous New Bag/Given 04/25/16 1532)     ____________________________________________   INITIAL IMPRESSION / ASSESSMENT AND PLAN / ED COURSE  Pertinent labs & imaging results that were available during my care of the patient were reviewed by me and considered in my medical decision making (see chart for details).  Clinical Course  Comment By Time  Patient is currently on menses which would explain 3+ hemoglobin and red blood cells too numerous to count. Patient is tolerating IV fluids well which will help decrease her glucose as well as improved sodium chloride counts. Chest x-ray is negative which is reassuring.  Hope Pigeon, PA-C 08/15 1648    Patient's diagnosis is consistent with Viral URI with cough. Patient will  be discharged home with prescriptions for albuterol inhaler, Tessalon Perles and Flonase nasal spray to use as directed. 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 Patient is advised to take her diabetic medications as prescribed every day, especially sick days. Glucose at time of discharge was 270. Patient verbalizes that she will take her Levemir this evening and continue as prescribed. Patient should keep her appointment with her primary care provider for follow-up as currently scheduled. Patient is given ED precautions to return to the ED for any worsening or new symptoms.    ____________________________________________  FINAL CLINICAL IMPRESSION(S) / ED DIAGNOSES  Final diagnoses:  Viral URI with cough      NEW MEDICATIONS STARTED DURING THIS VISIT:  New Prescriptions   ALBUTEROL (PROVENTIL HFA;VENTOLIN HFA) 108 (90 BASE) MCG/ACT INHALER    Inhale 1-2 puffs into the lungs every 6 (six) hours as needed for wheezing or shortness of breath.   BENZONATATE (TESSALON PERLES) 100 MG CAPSULE    Take 1 capsule (100 mg total) by mouth 3 (three) times daily as needed for cough.   FLUTICASONE (FLONASE) 50 MCG/ACT NASAL SPRAY    Place 2 sprays into both nostrils daily.         Hope Pigeon, PA-C 04/25/16 1725    Nita Sickle, MD 04/26/16 2251

## 2016-05-26 ENCOUNTER — Emergency Department
Admission: EM | Admit: 2016-05-26 | Discharge: 2016-05-26 | Disposition: A | Discharge status: Home / Self Care | Payer: 59 | Attending: Emergency Medicine | Admitting: Emergency Medicine

## 2016-05-26 ENCOUNTER — Encounter: Payer: Self-pay | Admitting: Emergency Medicine

## 2016-05-26 ENCOUNTER — Emergency Department: Payer: 59

## 2016-05-26 DIAGNOSIS — R1110 Vomiting, unspecified: Secondary | ICD-10-CM | POA: Insufficient documentation

## 2016-05-26 DIAGNOSIS — R05 Cough: Secondary | ICD-10-CM | POA: Diagnosis present

## 2016-05-26 DIAGNOSIS — J069 Acute upper respiratory infection, unspecified: Secondary | ICD-10-CM

## 2016-05-26 DIAGNOSIS — R197 Diarrhea, unspecified: Secondary | ICD-10-CM | POA: Diagnosis not present

## 2016-05-26 DIAGNOSIS — E119 Type 2 diabetes mellitus without complications: Secondary | ICD-10-CM | POA: Insufficient documentation

## 2016-05-26 DIAGNOSIS — F17200 Nicotine dependence, unspecified, uncomplicated: Secondary | ICD-10-CM | POA: Diagnosis not present

## 2016-05-26 DIAGNOSIS — Z79899 Other long term (current) drug therapy: Secondary | ICD-10-CM | POA: Insufficient documentation

## 2016-05-26 DIAGNOSIS — J45901 Unspecified asthma with (acute) exacerbation: Secondary | ICD-10-CM | POA: Diagnosis not present

## 2016-05-26 DIAGNOSIS — Z7984 Long term (current) use of oral hypoglycemic drugs: Secondary | ICD-10-CM | POA: Diagnosis not present

## 2016-05-26 DIAGNOSIS — B9789 Other viral agents as the cause of diseases classified elsewhere: Secondary | ICD-10-CM

## 2016-05-26 MED ORDER — METHYLPREDNISOLONE SODIUM SUCC 125 MG IJ SOLR
125.0000 mg | Freq: Once | INTRAMUSCULAR | Status: AC
  Administered 2016-05-26: 125 mg via INTRAMUSCULAR
  Filled 2016-05-26: qty 2

## 2016-05-26 MED ORDER — PREDNISONE 10 MG PO TABS: ORAL_TABLET | ORAL | 0 refills | Status: DC

## 2016-05-26 MED ORDER — ALBUTEROL SULFATE HFA 108 (90 BASE) MCG/ACT IN AERS: 1.0000 | INHALATION_SPRAY | Freq: Four times a day (QID) | RESPIRATORY_TRACT | 0 refills | Status: DC | PRN

## 2016-05-26 MED ORDER — IPRATROPIUM-ALBUTEROL 0.5-2.5 (3) MG/3ML IN SOLN
3.0000 mL | Freq: Once | RESPIRATORY_TRACT | Status: AC
  Administered 2016-05-26: 3 mL via RESPIRATORY_TRACT
  Filled 2016-05-26: qty 3

## 2016-05-26 MED ORDER — PSEUDOEPH-BROMPHEN-DM 30-2-10 MG/5ML PO SYRP: 10.0000 mL | ORAL_SOLUTION | Freq: Four times a day (QID) | ORAL | 0 refills | Status: DC | PRN

## 2016-05-26 MED ORDER — AZITHROMYCIN 250 MG PO TABS: ORAL_TABLET | ORAL | 0 refills | Status: DC

## 2016-05-26 NOTE — ED Provider Notes (Signed)
[facility] Emergency Department Provider Note  ____________________________________________  Time seen: Approximately 6:22 PM  I have reviewed the triage vital signs and the nursing notes.   HISTORY  Chief Complaint Nasal Congestion and Cough    HPI Alexa Dunn is a 30 y.o. female , NAD, presents to the emergency department with four-day history of cough and nasal congestion. Has had coughing fits that are productive of white sputum off and on over the last few days. She states that occasionally the coughing fits have caused her to vomit.  She endorses shortness of breath and wheezing and notes a history of asthma.  She endorses a fever with a temperature of 101F 3 days ago that resolved with ibuprofen and has not recurred.  She also reports diarrhea for the past 2 days.  Patient reports taking Mucinex, Nyquil, Robitussin DM without relief of cough or congestion.  Patient has history of asthma, for which she has an albuterol inhaler for use as needed.  She took 1 puff from the inhaler, which she states emptied the inhaler. Patient typically smokes 1/2 pack of cigarettes per day, but reports not smoking the past couple of days due to her shortness of breath and cough. Patient reports that several of her coworkers have been sick with a "virus" and believes she caught the illness from them.     Past Medical History:  Diagnosis Date  . Diabetes mellitus without complication (HCC)     There are no active problems to display for this patient.   Past Surgical History:  Procedure Laterality Date  . TONSILLECTOMY      Prior to Admission medications   Medication Sig Start Date End Date Taking? Authorizing Provider  albuterol (PROVENTIL HFA;VENTOLIN HFA) 108 (90 Base) MCG/ACT inhaler Inhale 1-2 puffs into the lungs every 6 (six) hours as needed for wheezing or shortness of breath. 05/26/16   Jami L Hagler, PA-C  azithromycin (ZITHROMAX Z-PAK) 250 MG tablet Take 2  tablets (500 mg) on  Day 1,  followed by 1 tablet (250 mg) once daily on Days 2 through 5. 05/26/16   Jami L Hagler, PA-C  benzonatate (TESSALON PERLES) 100 MG capsule Take 1 capsule (100 mg total) by mouth 3 (three) times daily as needed for cough. 04/25/16   Jami L Hagler, PA-C  brompheniramine-pseudoephedrine-DM 30-2-10 MG/5ML syrup Take 10 mLs by mouth 4 (four) times daily as needed. 05/26/16   Jami L Hagler, PA-C  fluticasone (FLONASE) 50 MCG/ACT nasal spray Place 2 sprays into both nostrils daily. 04/25/16   Jami L Hagler, PA-C  glipiZIDE (GLUCOTROL) 10 MG tablet Take 10 mg by mouth daily before breakfast.    Historical Provider, MD  metFORMIN (GLUCOPHAGE) 1000 MG tablet Take 500 mg by mouth 2 (two) times daily with a meal.     Historical Provider, MD  predniSONE (DELTASONE) 10 MG tablet Take a daily regimen of 6,5,4,3,2,1 05/26/16   Jami L Hagler, PA-C    Allergies Penicillins  History reviewed. No pertinent family history.  Social History Social History  Substance Use Topics  . Smoking status: Current Every Day Smoker    Packs/day: 1.00  . Smokeless tobacco: Never Used  . Alcohol use No     Review of Systems  Constitutional: Positive fever with MAXIMUM TEMPERATURE of 101F. No chills, fatigue, rigors Eyes: No visual changes. No discharge, redness, pain ENT: Positive nasal congestion, postnasal drip. No sore throat, ear pain, sinus congestion, runny nose, ear drainage.  Cardiovascular: No chest  pain or palpitations. Respiratory: Positive for cough, shortness of breath, and wheezing.  Gastrointestinal: Positive for post tussive emesis. Positive diarrhea. No abdominal pain.  No nausea.   Genitourinary: Negative for dysuria, hematuria, changes in urinary frequency Musculoskeletal: Positive for general myalgias.   Skin: Negative for rash. Neurological: Negative for headaches, focal weakness or numbness. 10-point ROS otherwise  negative.  ____________________________________________   PHYSICAL EXAM:  VITAL SIGNS: ED Triage Vitals  Enc Vitals Group     BP 05/26/16 1556 (!) 144/75     Pulse Rate 05/26/16 1556 99     Resp 05/26/16 1556 19     Temp 05/26/16 1556 98.3 F (36.8 C)     Temp Source 05/26/16 1556 Oral     SpO2 05/26/16 1556 97 %     Weight 05/26/16 1558 190 lb (86.2 kg)     Height 05/26/16 1558 5\' 5"  (1.651 m)     Head Circumference --      Peak Flow --      Pain Score --      Pain Loc --      Pain Edu? --      Excl. in GC? --      Constitutional: Alert and oriented. Well appearing and in no acute distress. Eyes: Conjunctivae are normalWithout icterus or injection. PERRL. EOMI without pain.  Head: Atraumatic. ENT:      Ears: TMs intact bilaterally, pearly Leifheit, non-bulging, and with appropriate cone of light.       Nose: Moderate nasal congestion with moderate clear rhinnorhea.      Mouth/Throat: Mucous membranes are moist. Pharynx without erythema, swelling, exudate. Uvula is midline. Airways patent. Clear postnasal drip. Neck: Supple with full range of motion. Hematological/Lymphatic/Immunilogical: No cervical lymphadenopathy. Cardiovascular: Normal rate, regular rhythm. Normal S1 and S2.  Good peripheral circulation. Respiratory: Increased respiratory effort without tachypnea nor retractions. Lungs with diffuse wheezes but no rales, rhonchi. Breath sounds are noted in all lung fields. Neurologic:  Normal speech and language. No gross focal neurologic deficits are appreciated.  Skin:  Skin is warm, dry and intact. No rash noted. Psychiatric: Mood and affect are normal. Speech and behavior are normal. Patient exhibits appropriate insight and judgement.   ____________________________________________   LABS  None ____________________________________________  EKG  None ____________________________________________  RADIOLOGY I have personally viewed and evaluated these images  (plain radiographs) as part of my medical decision making, as well as reviewing the written report by the radiologist.  Dg Chest 2 View  Result Date: 05/26/2016 CLINICAL DATA:  Patient with cough and congestion. EXAM: CHEST  2 VIEW COMPARISON:  Chest radiograph 04/25/2016 FINDINGS: Normal cardiac and mediastinal contours. No consolidative pulmonary opacities. No pleural effusion or pneumothorax. Regional skeleton is unremarkable. IMPRESSION: No active cardiopulmonary disease. Electronically Signed   By: Annia Beltrew  Davis M.D.   On: 05/26/2016 18:21    ____________________________________________    PROCEDURES  Procedure(s) performed: None   Procedures   Medications  ipratropium-albuterol (DUONEB) 0.5-2.5 (3) MG/3ML nebulizer solution 3 mL (3 mLs Nebulization Given 05/26/16 1737)  methylPREDNISolone sodium succinate (SOLU-MEDROL) 125 mg/2 mL injection 125 mg (125 mg Intramuscular Given 05/26/16 1840)   Patient's respiratory examination after DuoNeb was given has significantly improved. Patient with significantly less wheeze and continues to have no rhonchi or rales. Patient verbalizes she feels much better since completing the nebulized DuoNeb.  ____________________________________________   INITIAL IMPRESSION / ASSESSMENT AND PLAN / ED COURSE  Pertinent labs & imaging results that were available during my care  of the patient were reviewed by me and considered in my medical decision making (see chart for details).  Clinical Course    Patient's diagnosis is consistent with asthma exacerbation and acute upper respiratory infection. Patient will be discharged home with prescriptions for albuterol inhaler, Azithromycin, Bromfed-DM cough syrup, prednisone Dosepak to take as directed. Considering the patient's history of asthma and diabetes, fields best to cover with an antibiotic at this time. Patient is to follow up with her primary care provider if symptoms persist past this treatment course.  Patient is given ED precautions to return to the ED for any worsening or new symptoms.    ____________________________________________  FINAL CLINICAL IMPRESSION(S) / ED DIAGNOSES  Final diagnoses:  Asthma with exacerbation, unspecified asthma severity  Viral URI with cough      NEW MEDICATIONS STARTED DURING THIS VISIT:  Discharge Medication List as of 05/26/2016  6:41 PM    START taking these medications   Details  azithromycin (ZITHROMAX Z-PAK) 250 MG tablet Take 2 tablets (500 mg) on  Day 1,  followed by 1 tablet (250 mg) once daily on Days 2 through 5., Print    brompheniramine-pseudoephedrine-DM 30-2-10 MG/5ML syrup Take 10 mLs by mouth 4 (four) times daily as needed., Starting Fri 05/26/2016, Print    predniSONE (DELTASONE) 10 MG tablet Take a daily regimen of 6,5,4,3,2,1, Print             Hope Pigeon, PA-C 05/26/16 1914    Governor Rooks, MD 05/26/16 2046

## 2016-05-26 NOTE — ED Triage Notes (Addendum)
Pt c/o chest and sinus congestion. Lot of nasal drainage. Pt has had white productive cough. Wakes up coughing at night especially. One episode vomiting this week from coughing so much. Coughing in triage. Hard to breath when has coughing fit. Pt tachypenic in triage but improves when talking about other topics. Mouth breathing, reports cannot breath through nose. Reports a bunch of people at her work have had respiratory viruses.

## 2016-07-22 DIAGNOSIS — N764 Abscess of vulva: Secondary | ICD-10-CM | POA: Insufficient documentation

## 2017-04-10 ENCOUNTER — Emergency Department: Payer: Self-pay

## 2017-04-10 ENCOUNTER — Other Ambulatory Visit: Payer: Self-pay

## 2017-04-10 ENCOUNTER — Emergency Department
Admission: EM | Admit: 2017-04-10 | Discharge: 2017-04-10 | Disposition: A | Discharge status: Home / Self Care | Payer: Self-pay | Attending: Student in an Organized Health Care Education/Training Program | Admitting: Student in an Organized Health Care Education/Training Program

## 2017-04-10 ENCOUNTER — Encounter: Payer: Self-pay | Admitting: Emergency Medicine

## 2017-04-10 DIAGNOSIS — Z79899 Other long term (current) drug therapy: Secondary | ICD-10-CM | POA: Insufficient documentation

## 2017-04-10 DIAGNOSIS — R5383 Other fatigue: Secondary | ICD-10-CM | POA: Insufficient documentation

## 2017-04-10 DIAGNOSIS — E119 Type 2 diabetes mellitus without complications: Secondary | ICD-10-CM | POA: Insufficient documentation

## 2017-04-10 DIAGNOSIS — R51 Headache: Secondary | ICD-10-CM | POA: Insufficient documentation

## 2017-04-10 DIAGNOSIS — F17210 Nicotine dependence, cigarettes, uncomplicated: Secondary | ICD-10-CM | POA: Insufficient documentation

## 2017-04-10 DIAGNOSIS — R1011 Right upper quadrant pain: Secondary | ICD-10-CM

## 2017-04-10 DIAGNOSIS — R519 Headache, unspecified: Secondary | ICD-10-CM

## 2017-04-10 DIAGNOSIS — Z7984 Long term (current) use of oral hypoglycemic drugs: Secondary | ICD-10-CM | POA: Insufficient documentation

## 2017-04-10 LAB — BASIC METABOLIC PANEL
Anion gap: 10 (ref 5–15)
BUN: 9 mg/dL (ref 6–20)
CHLORIDE: 102 mmol/L (ref 101–111)
CO2: 22 mmol/L (ref 22–32)
Calcium: 9.4 mg/dL (ref 8.9–10.3)
Creatinine, Ser: 0.59 mg/dL (ref 0.44–1.00)
Glucose, Bld: 328 mg/dL — ABNORMAL HIGH (ref 65–99)
POTASSIUM: 4.1 mmol/L (ref 3.5–5.1)
SODIUM: 134 mmol/L — AB (ref 135–145)

## 2017-04-10 LAB — CBC
HEMATOCRIT: 42.5 % (ref 35.0–47.0)
HEMOGLOBIN: 14.5 g/dL (ref 12.0–16.0)
MCH: 27 pg (ref 26.0–34.0)
MCHC: 34.2 g/dL (ref 32.0–36.0)
MCV: 79.1 fL — AB (ref 80.0–100.0)
Platelets: 286 10*3/uL (ref 150–440)
RBC: 5.37 MIL/uL — AB (ref 3.80–5.20)
RDW: 12.7 % (ref 11.5–14.5)
WBC: 8.8 10*3/uL (ref 3.6–11.0)

## 2017-04-10 LAB — TROPONIN I: Troponin I: <0.03 ng/mL (ref <0.03)

## 2017-04-10 LAB — HEPATIC FUNCTION PANEL
ALK PHOS: 43 U/L (ref 38–126)
ALT: 16 U/L (ref 14–54)
AST: 21 U/L (ref 15–41)
Albumin: 3.9 g/dL (ref 3.5–5.0)
Total Bilirubin: 0.5 mg/dL (ref 0.3–1.2)
Total Protein: 7.4 g/dL (ref 6.5–8.1)

## 2017-04-10 LAB — HCG, QUANTITATIVE, PREGNANCY: hCG, Beta Chain, Quant, S: 1 m[IU]/mL (ref <5)

## 2017-04-10 LAB — URINALYSIS, COMPLETE (UACMP) WITH MICROSCOPIC
BACTERIA UA: NONE SEEN
BILIRUBIN URINE: NEGATIVE
Glucose, UA: >=500 mg/dL — AB
Hgb urine dipstick: NEGATIVE
KETONES UR: NEGATIVE mg/dL
Nitrite: NEGATIVE
PROTEIN: 30 mg/dL — AB
Specific Gravity, Urine: 1.032 — ABNORMAL HIGH (ref 1.005–1.030)
pH: 6 (ref 5.0–8.0)

## 2017-04-10 MED ORDER — ACETAMINOPHEN 500 MG PO TABS
1000.0000 mg | ORAL_TABLET | Freq: Once | ORAL | Status: AC
  Administered 2017-04-10: 1000 mg via ORAL
  Filled 2017-04-10: qty 2

## 2017-04-10 MED ORDER — SODIUM CHLORIDE 0.9 % IV BOLUS (SEPSIS)
1000.0000 mL | Freq: Once | INTRAVENOUS | Status: AC
  Administered 2017-04-10: 1000 mL via INTRAVENOUS

## 2017-04-10 MED ORDER — PROCHLORPERAZINE EDISYLATE 5 MG/ML IJ SOLN
10.0000 mg | Freq: Once | INTRAMUSCULAR | Status: AC
  Administered 2017-04-10: 10 mg via INTRAVENOUS

## 2017-04-10 MED ORDER — PROMETHAZINE HCL 12.5 MG PO TABS: 12.5000 mg | ORAL_TABLET | Freq: Four times a day (QID) | ORAL | 0 refills | Status: DC | PRN

## 2017-04-10 MED ORDER — PROCHLORPERAZINE EDISYLATE 5 MG/ML IJ SOLN
INTRAMUSCULAR | Status: AC
  Administered 2017-04-10: 10 mg via INTRAVENOUS
  Filled 2017-04-10: qty 2

## 2017-04-10 NOTE — ED Provider Notes (Signed)
[facility] Emergency Department Provider Note    First MD Initiated Contact with Patient 04/10/17 1445     (approximate)  I have reviewed the triage vital signs and the nursing notes.   HISTORY  Chief Complaint Chest Pain; Headache; and Fatigue    HPI Alexa Dunn is a 31 y.o. female history type 2 diabetes presents with 24 hours of chest pain nausea or chills fatigue and headache. States his symptoms started with chest pain in the right lower thorax. No associated shortness of breath. Not significantly worsened with deep inspiration. No vomiting. No diarrhea. No flank pain or back pain. States that she cannot be pregnant as she is on neck supple none. No productive cough. Does smoke daily. Denies any numbness or tingling. No previous abdominal surgeries.   Past Medical History:  Diagnosis Date  . Diabetes mellitus without complication (HCC)    No family history on file. Past Surgical History:  Procedure Laterality Date  . TONSILLECTOMY     There are no active problems to display for this patient.     Prior to Admission medications   Medication Sig Start Date End Date Taking? Authorizing Provider  albuterol (PROVENTIL HFA;VENTOLIN HFA) 108 (90 Base) MCG/ACT inhaler Inhale 1-2 puffs into the lungs every 6 (six) hours as needed for wheezing or shortness of breath. 05/26/16   Hagler, Jami L, PA-C  azithromycin (ZITHROMAX Z-PAK) 250 MG tablet Take 2 tablets (500 mg) on  Day 1,  followed by 1 tablet (250 mg) once daily on Days 2 through 5. 05/26/16   Hagler, Jami L, PA-C  benzonatate (TESSALON PERLES) 100 MG capsule Take 1 capsule (100 mg total) by mouth 3 (three) times daily as needed for cough. 04/25/16   Hagler, Jami L, PA-C  brompheniramine-pseudoephedrine-DM 30-2-10 MG/5ML syrup Take 10 mLs by mouth 4 (four) times daily as needed. 05/26/16   Hagler, Jami L, PA-C  fluticasone (FLONASE) 50 MCG/ACT nasal spray Place 2 sprays into both nostrils daily.  04/25/16   Hagler, Jami L, PA-C  glipiZIDE (GLUCOTROL) 10 MG tablet Take 10 mg by mouth daily before breakfast.    [provider]  metFORMIN (GLUCOPHAGE) 1000 MG tablet Take 500 mg by mouth 2 (two) times daily with a meal.     [provider]  predniSONE (DELTASONE) 10 MG tablet Take a daily regimen of 6,5,4,3,2,1 05/26/16   Hagler, Jami L, PA-C  promethazine (PHENERGAN) 12.5 MG tablet Take 1 tablet (12.5 mg total) by mouth every 6 (six) hours as needed for nausea or vomiting. 04/10/17   Willy Eddyobinson, Chaquana Nichols, MD    Allergies Penicillins    Social History Social History  Substance Use Topics  . Smoking status: Current Every Day Smoker    Packs/day: 1.00  . Smokeless tobacco: Never Used  . Alcohol use No    Review of Systems Patient denies headaches, rhinorrhea, blurry vision, numbness, shortness of breath, chest pain, edema, cough, abdominal pain, nausea, vomiting, diarrhea, dysuria, fevers, rashes or hallucinations unless otherwise stated above in HPI. ____________________________________________   PHYSICAL EXAM:  VITAL SIGNS: Vitals:   04/10/17 1347  BP: 133/78  Pulse: 90  Resp: 14  Temp: 98.7 F (37.1 C)    Constitutional: Alert and oriented.  in no acute distress. Eyes: Conjunctivae are normal.  Head: Atraumatic. Nose: No congestion/rhinnorhea. Mouth/Throat: Mucous membranes are moist.  Uvula midline, tonsillar pillars normal   Neck: No stridor. Painless ROM.  Cardiovascular: Normal rate, regular rhythm. Grossly normal heart sounds.  Good peripheral circulation. Respiratory: Normal respiratory effort.  No retractions. Lungs CTAB. Gastrointestinal: Soft and nontender. No distention. No abdominal bruits. No CVA tenderness. Genitourinary:  Musculoskeletal: No lower extremity tenderness nor edema.  No joint effusions. Neurologic:  Normal speech and language. No gross focal neurologic deficits are appreciated. No facial droop Skin:  Skin is warm, dry and  intact. No rash noted. Psychiatric: Mood and affect are normal. Speech and behavior are normal.  ____________________________________________   LABS (all labs ordered are listed, but only abnormal results are displayed)  Results for orders placed or performed during the hospital encounter of 04/10/17 (from the past 24 hour(s))  Basic metabolic panel     Status: Abnormal   Collection Time: 04/10/17  1:50 PM  Result Value Ref Range   Sodium 134 (L) 135 - 145 mmol/L   Potassium 4.1 3.5 - 5.1 mmol/L   Chloride 102 101 - 111 mmol/L   CO2 22 22 - 32 mmol/L   Glucose, Bld 328 (H) 65 - 99 mg/dL   BUN 9 6 - 20 mg/dL   Creatinine, Ser 8.11 0.44 - 1.00 mg/dL   Calcium 9.4 8.9 - 91.4 mg/dL   GFR calc non Af Amer >60 >60 mL/min   GFR calc Af Amer >60 >60 mL/min   Anion gap 10 5 - 15  CBC     Status: Abnormal   Collection Time: 04/10/17  1:50 PM  Result Value Ref Range   WBC 8.8 3.6 - 11.0 K/uL   RBC 5.37 (H) 3.80 - 5.20 MIL/uL   Hemoglobin 14.5 12.0 - 16.0 g/dL   HCT 78.2 95.6 - 21.3 %   MCV 79.1 (L) 80.0 - 100.0 fL   MCH 27.0 26.0 - 34.0 pg   MCHC 34.2 32.0 - 36.0 g/dL   RDW 08.6 57.8 - 46.9 %   Platelets 286 150 - 440 K/uL  Troponin I     Status: None   Collection Time: 04/10/17  1:50 PM  Result Value Ref Range   Troponin I <0.03 <0.03 ng/mL  Hepatic function panel     Status: Abnormal   Collection Time: 04/10/17  1:50 PM  Result Value Ref Range   Total Protein 7.4 6.5 - 8.1 g/dL   Albumin 3.9 3.5 - 5.0 g/dL   AST 21 15 - 41 U/L   ALT 16 14 - 54 U/L   Alkaline Phosphatase 43 38 - 126 U/L   Total Bilirubin 0.5 0.3 - 1.2 mg/dL   Bilirubin, Direct <6.2 (L) 0.1 - 0.5 mg/dL   Indirect Bilirubin NOT CALCULATED 0.3 - 0.9 mg/dL  hCG, quantitative, pregnancy     Status: None   Collection Time: 04/10/17  1:50 PM  Result Value Ref Range   hCG, Beta Chain, Quant, S 1 <5 mIU/mL  Urinalysis, Complete w Microscopic     Status: Abnormal   Collection Time: 04/10/17  5:00 PM  Result Value  Ref Range   Color, Urine YELLOW (A) YELLOW   APPearance CLEAR (A) CLEAR   Specific Gravity, Urine 1.032 (H) 1.005 - 1.030   pH 6.0 5.0 - 8.0   Glucose, UA >=500 (A) NEGATIVE mg/dL   Hgb urine dipstick NEGATIVE NEGATIVE   Bilirubin Urine NEGATIVE NEGATIVE   Ketones, ur NEGATIVE NEGATIVE mg/dL   Protein, ur 30 (A) NEGATIVE mg/dL   Nitrite NEGATIVE NEGATIVE   Leukocytes, UA TRACE (A) NEGATIVE   RBC / HPF 0-5 0 - 5 RBC/hpf   WBC, UA 6-30 0 - 5  WBC/hpf   Bacteria, UA NONE SEEN NONE SEEN   Squamous Epithelial / LPF 6-30 (A) NONE SEEN   ____________________________________________  EKG My review and personal interpretation at Time: 14:05   Indication: chest pain  Rate: 90  Rhythm: sinus Axis: normal Other: non specific st changes, normal intervals, no pr depressions, no stemi ____________________________________________  RADIOLOGY  I personally reviewed all radiographic images ordered to evaluate for the above acute complaints and reviewed radiology reports and findings.  These findings were personally discussed with the patient.  Please see medical record for radiology report.  ____________________________________________   PROCEDURES  Procedure(s) performed:  Procedures    Critical Care performed: no ____________________________________________   INITIAL IMPRESSION / ASSESSMENT AND PLAN / ED COURSE  Pertinent labs & imaging results that were available during my care of the patient were reviewed by me and considered in my medical decision making (see chart for details).  DDX: uri, cholelithiasis, cholecystitis, pna, bronchitis, viral illness, hyperglycemia  Alexa Dunn is a 31 y.o. who presents to the ED with above complaints. Patient does appear mildly dehydrated. Otherwise well appearing and in no acute distress. Patient is a nontoxic-appearing. EKG shows no evidence of acute ischemia. Troponin is negative. Blood work without any leukocytosis. No evidence of biliary  obstruction or elevated LFTs.  Urinalysis does show elevated specific gravity. No ketones. No bacteria. Do suspect much of her symptoms are related to dehydration with elevated glucose. Patient was given IV fluids tablet dehydration. This is not clinically consistent with meningitis or pneumonia or acute intra-abdominal process. No evidence of cholelithiasis or cholecystitis.  Patient was able to tolerate PO and was able to ambulate with a steady gait.  Have discussed with the patient and available family all diagnostics and treatments performed thus far and all questions were answered to the best of my ability. The patient demonstrates understanding and agreement with plan.       ____________________________________________   FINAL CLINICAL IMPRESSION(S) / ED DIAGNOSES  Final diagnoses:  RUQ pain  Acute nonintractable headache, unspecified headache type      NEW MEDICATIONS STARTED DURING THIS VISIT:  New Prescriptions   PROMETHAZINE (PHENERGAN) 12.5 MG TABLET    Take 1 tablet (12.5 mg total) by mouth every 6 (six) hours as needed for nausea or vomiting.     Note:  This document was prepared using Dragon voice recognition software and may include unintentional dictation errors.    Willy Eddyobinson, Wofford Stratton, MD 04/10/17 [phone]

## 2017-04-10 NOTE — ED Notes (Signed)
See triage note  States she states she developed some chest discomfort last pm and "just didn't feel well  This am states pain is mainly to right side of chest   Pain increases with deep inspiration  Also having a h/a

## 2017-04-10 NOTE — ED Triage Notes (Signed)
Pt reports headache, chest pressure started yesterday and states she feels "weak".

## 2017-04-10 NOTE — Discharge Instructions (Signed)
As discussed in the emergency department, you may use Tylenol and/or Ibuprofen for headaches. These are "Over the Counter" medications and can be found at most drug stores and grocery stores. Please use the recommended dosing instructions on the bottle/box. Do not exceed the maximum dose for either medications. Please be sure to rest and drink plenty of fluids. Please be sure to call your PCP for a follow-up visit, especially if your headaches persist.  Please call your physician or return to ED if you have: 1. Worsening or change in headaches. 2. Changes in vision. 3. New-onset nausea and vomiting. 4. Numbness, tingling, weakness in your extremities,. 4. Inability to eat or drink adequate amounts of food or liquids. 5. Chest pain, shortness of breath, or difficulty breathing. 6. Neurological changes- dizziness, fainting, loss of function of your arms, legs or other parts of your body. 7. Uncontrolled hypertension. 8. Or any other emergent concerns. You have been seen in the emergency department for emergency care. It is important that you contact your own doctor, specialist or the closest clinic for follow-up care. Please bring this instruction sheet, all medications and X-ray copies with you when you are seen for follow-up care.  Determining the exact cause for all patients with abdominal pain is extremely difficult in the emergency department. Our primary focus is to rule-out immediate life-threatening diseases. If no immediate source of pain is found the definitive diagnosis frequently needs to be determined over time.Many times your primary care physician can determine the cause by following the symptoms over time. Sometimes, specialist are required such as Gastroenterologists, Gynecologists, Urologists or Surgeons. Please return immediately to the Emergency Department for fever>101, Vomiting or Intractable Pain. You should return to the emergency department or see your primary care provider in  12-24hrs if your pain is no better and sooner if your pain becomes worse.  

## 2017-04-10 NOTE — ED Triage Notes (Signed)
Pt reports cold sweats, cough and headache since last night.

## 2017-08-24 ENCOUNTER — Emergency Department
Admission: EM | Admit: 2017-08-24 | Discharge: 2017-08-24 | Disposition: A | Discharge status: Home / Self Care | Payer: 59 | Attending: Student in an Organized Health Care Education/Training Program | Admitting: Student in an Organized Health Care Education/Training Program

## 2017-08-24 ENCOUNTER — Other Ambulatory Visit: Payer: Self-pay

## 2017-08-24 DIAGNOSIS — R0981 Nasal congestion: Secondary | ICD-10-CM | POA: Insufficient documentation

## 2017-08-24 DIAGNOSIS — R0982 Postnasal drip: Secondary | ICD-10-CM | POA: Insufficient documentation

## 2017-08-24 DIAGNOSIS — F17210 Nicotine dependence, cigarettes, uncomplicated: Secondary | ICD-10-CM | POA: Insufficient documentation

## 2017-08-24 DIAGNOSIS — H9202 Otalgia, left ear: Secondary | ICD-10-CM | POA: Insufficient documentation

## 2017-08-24 DIAGNOSIS — Z79899 Other long term (current) drug therapy: Secondary | ICD-10-CM | POA: Insufficient documentation

## 2017-08-24 DIAGNOSIS — E119 Type 2 diabetes mellitus without complications: Secondary | ICD-10-CM | POA: Insufficient documentation

## 2017-08-24 DIAGNOSIS — R519 Headache, unspecified: Secondary | ICD-10-CM

## 2017-08-24 DIAGNOSIS — R51 Headache: Secondary | ICD-10-CM

## 2017-08-24 DIAGNOSIS — Z7984 Long term (current) use of oral hypoglycemic drugs: Secondary | ICD-10-CM | POA: Insufficient documentation

## 2017-08-24 DIAGNOSIS — J0100 Acute maxillary sinusitis, unspecified: Secondary | ICD-10-CM | POA: Insufficient documentation

## 2017-08-24 MED ORDER — KETOROLAC TROMETHAMINE 60 MG/2ML IM SOLN
60.0000 mg | Freq: Once | INTRAMUSCULAR | Status: AC
  Administered 2017-08-24: 60 mg via INTRAMUSCULAR
  Filled 2017-08-24: qty 2

## 2017-08-24 MED ORDER — SULFAMETHOXAZOLE-TRIMETHOPRIM 800-160 MG PO TABS: 1.0000 | ORAL_TABLET | Freq: Two times a day (BID) | ORAL | 0 refills | Status: DC

## 2017-08-24 MED ORDER — NAPROXEN 500 MG PO TABS: 500.0000 mg | ORAL_TABLET | Freq: Two times a day (BID) | ORAL | Status: DC

## 2017-08-24 MED ORDER — FEXOFENADINE-PSEUDOEPHED ER 60-120 MG PO TB12: 1.0000 | ORAL_TABLET | Freq: Two times a day (BID) | ORAL | 0 refills | Status: DC

## 2017-08-24 MED ORDER — TRAMADOL HCL 50 MG PO TABS
50.0000 mg | ORAL_TABLET | Freq: Once | ORAL | Status: AC
  Administered 2017-08-24: 50 mg via ORAL
  Filled 2017-08-24: qty 1

## 2017-08-24 NOTE — ED Notes (Signed)
Pt presents today with headache and pressure that started on Tuesday. Yesterday right ear hurting. Pt states she is not able to sleep. Pt states she has taken cold and flu medications that are OTC. Pt states she has had a sinus headache before and this is what it feels like. PT is NAD family at bedside, awaiting EDP.

## 2017-08-24 NOTE — ED Triage Notes (Signed)
Pt states Tuesday started with HA. Now with sinus pressure in face and L ear pain. States nasal drainage. Alert, oriented, ambulatory.

## 2017-08-24 NOTE — ED Triage Notes (Signed)
First Nurse Note:  C/o sinus pressure and pain x 4 days.  Yesterday Left ear started hurting as well.    AAOx3.  Skin warm and dry.  NAD

## 2017-08-24 NOTE — ED Provider Notes (Signed)
[facility] Emergency Department Provider Note   ____________________________________________   First MD Initiated Contact with Patient 08/24/17 1319     (approximate)  I have reviewed the triage vital signs and the nursing notes.   HISTORY  Chief Complaint Facial Pain    HPI Alexa Dunn is a 31 y.o. female patient complained of frontal headache signs pressure left ear pain. Patient also complaining of nasal drainage. Patient states sinus congestion for approximately 5-7 days. Patient no relief for over-the-counter medications.Patient denies visual disturbance or vertigo. Patient denies hearing loss. No palliative measures for complaint. Patient rates the pain as 8/10. Patient described a pain as "pressure".   Past Medical History:  Diagnosis Date  . Diabetes mellitus without complication (HCC)     There are no active problems to display for this patient.   Past Surgical History:  Procedure Laterality Date  . TONSILLECTOMY      Prior to Admission medications   Medication Sig Start Date End Date Taking? Authorizing Provider  albuterol (PROVENTIL HFA;VENTOLIN HFA) 108 (90 Base) MCG/ACT inhaler Inhale 1-2 puffs into the lungs every 6 (six) hours as needed for wheezing or shortness of breath. 05/26/16   Hagler, Jami L, PA-C  azithromycin (ZITHROMAX Z-PAK) 250 MG tablet Take 2 tablets (500 mg) on  Day 1,  followed by 1 tablet (250 mg) once daily on Days 2 through 5. 05/26/16   Hagler, Jami L, PA-C  benzonatate (TESSALON PERLES) 100 MG capsule Take 1 capsule (100 mg total) by mouth 3 (three) times daily as needed for cough. 04/25/16   Hagler, Jami L, PA-C  brompheniramine-pseudoephedrine-DM 30-2-10 MG/5ML syrup Take 10 mLs by mouth 4 (four) times daily as needed. 05/26/16   Hagler, Jami L, PA-C  fexofenadine-pseudoephedrine (ALLEGRA-D) 60-120 MG 12 hr tablet Take 1 tablet by mouth 2 (two) times daily. 08/24/17   Joni ReiningSmith, Ronald K, PA-C  fluticasone (FLONASE)  50 MCG/ACT nasal spray Place 2 sprays into both nostrils daily. 04/25/16   Hagler, Jami L, PA-C  glipiZIDE (GLUCOTROL) 10 MG tablet Take 10 mg by mouth daily before breakfast.    [provider]  metFORMIN (GLUCOPHAGE) 1000 MG tablet Take 500 mg by mouth 2 (two) times daily with a meal.     [provider]  naproxen (NAPROSYN) 500 MG tablet Take 1 tablet (500 mg total) by mouth 2 (two) times daily with a meal. 08/24/17   Joni ReiningSmith, Ronald K, PA-C  predniSONE (DELTASONE) 10 MG tablet Take a daily regimen of 6,5,4,3,2,1 05/26/16   Hagler, Jami L, PA-C  promethazine (PHENERGAN) 12.5 MG tablet Take 1 tablet (12.5 mg total) by mouth every 6 (six) hours as needed for nausea or vomiting. 04/10/17   Willy Eddyobinson, Patrick, MD  sulfamethoxazole-trimethoprim (BACTRIM DS,SEPTRA DS) 800-160 MG tablet Take 1 tablet by mouth 2 (two) times daily. 08/24/17   Joni ReiningSmith, Ronald K, PA-C    Allergies Penicillins  History reviewed. No pertinent family history.  Social History Social History   Tobacco Use  . Smoking status: Current Every Day Smoker    Packs/day: 1.00  . Smokeless tobacco: Never Used  Substance Use Topics  . Alcohol use: No  . Drug use: No    Review of Systems Constitutional: No fever/chills ENT: Nasal congestion and facial pain Cardiovascular: Denies chest pain. Respiratory: Denies shortness of breath. Gastrointestinal: No abdominal pain.  No nausea, no vomiting.  No diarrhea.  No constipation. Genitourinary: Negative for dysuria. Musculoskeletal: Negative for back pain. Skin: Negative for rash. Neurological:  Negative for headaches, focal weakness or numbness. Allergic/Immunilogical: Penicillin ____________________________________________   PHYSICAL EXAM:  VITAL SIGNS: ED Triage Vitals  Enc Vitals Group     BP 08/24/17 1242 (!) 147/88     Pulse Rate 08/24/17 1242 93     Resp 08/24/17 1242 18     Temp 08/24/17 1242 97.7 F (36.5 C)     Temp Source 08/24/17 1242 Oral      SpO2 08/24/17 1242 98 %     Weight 08/24/17 1243 190 lb (86.2 kg)     Height 08/24/17 1243 5\' 5"  (1.651 m)     Head Circumference --      Peak Flow --      Pain Score 08/24/17 1242 8     Pain Loc --      Pain Edu? --      Excl. in GC? --    Constitutional: Alert and oriented. Well appearing and in no acute distress. Nose: No congestion/rhinnorhea. Bilateral maxillary guarding with edematous nasal turbinates. Thick rhinorrhea and postnasal drainage. Mouth/Throat: Mucous membranes are moist.  Oropharynx non-erythematous. Neck: No stridor.  Hematological/Lymphatic/Immunilogical: No cervical lymphadenopathy. Cardiovascular: Normal rate, regular rhythm. Grossly normal heart sounds.  Good peripheral circulation. Respiratory: Normal respiratory effort.  No retractions. Lungs CTAB. Neurologic:  Normal speech and language. No gross focal neurologic deficits are appreciated. No gait instability. Skin:  Skin is warm, dry and intact. No rash noted. Psychiatric: Mood and affect are normal. Speech and behavior are normal.  ____________________________________________   LABS (all labs ordered are listed, but only abnormal results are displayed)  Labs Reviewed - No data to display ____________________________________________  EKG   ____________________________________________  RADIOLOGY  No results found.  ____________________________________________   PROCEDURES  Procedure(s) performed: None  Procedures  Critical Care performed: No  ____________________________________________   INITIAL IMPRESSION / ASSESSMENT AND PLAN / ED COURSE  As part of my medical decision making, I reviewed the following data within the electronic MEDICAL RECORD NUMBER    Headache secondary to sinusitis. Patient given discharge Instructions. Patient advised take medication as directed. Patient given a work note advised follow-up with PCP if no improvement 3-5 days.       ____________________________________________   FINAL CLINICAL IMPRESSION(S) / ED DIAGNOSES  Final diagnoses:  Subacute maxillary sinusitis  Sinus headache     ED Discharge Orders        Ordered    sulfamethoxazole-trimethoprim (BACTRIM DS,SEPTRA DS) 800-160 MG tablet  2 times daily     08/24/17 1328    fexofenadine-pseudoephedrine (ALLEGRA-D) 60-120 MG 12 hr tablet  2 times daily     08/24/17 1328    naproxen (NAPROSYN) 500 MG tablet  2 times daily with meals     08/24/17 1328       Note:  This document was prepared using Dragon voice recognition software and may include unintentional dictation errors.    Joni ReiningSmith, Ronald K, PA-C 08/24/17 1337    Willy Eddyobinson, Patrick, MD 08/24/17 [phone]

## 2017-08-24 NOTE — ED Notes (Signed)
Pt verbalized understanding of discharge instructions. NAD at this time. 

## 2017-09-16 ENCOUNTER — Emergency Department
Admission: EM | Admit: 2017-09-16 | Discharge: 2017-09-16 | Disposition: A | Discharge status: Home / Self Care | Payer: 59 | Attending: Emergency Medicine | Admitting: Emergency Medicine

## 2017-09-16 ENCOUNTER — Encounter: Payer: Self-pay | Admitting: Emergency Medicine

## 2017-09-16 DIAGNOSIS — J029 Acute pharyngitis, unspecified: Secondary | ICD-10-CM | POA: Insufficient documentation

## 2017-09-16 DIAGNOSIS — E119 Type 2 diabetes mellitus without complications: Secondary | ICD-10-CM | POA: Insufficient documentation

## 2017-09-16 DIAGNOSIS — F17200 Nicotine dependence, unspecified, uncomplicated: Secondary | ICD-10-CM | POA: Insufficient documentation

## 2017-09-16 DIAGNOSIS — Z79899 Other long term (current) drug therapy: Secondary | ICD-10-CM | POA: Insufficient documentation

## 2017-09-16 DIAGNOSIS — Z791 Long term (current) use of non-steroidal anti-inflammatories (NSAID): Secondary | ICD-10-CM | POA: Insufficient documentation

## 2017-09-16 DIAGNOSIS — Z7984 Long term (current) use of oral hypoglycemic drugs: Secondary | ICD-10-CM | POA: Insufficient documentation

## 2017-09-16 DIAGNOSIS — B349 Viral infection, unspecified: Secondary | ICD-10-CM | POA: Insufficient documentation

## 2017-09-16 LAB — GROUP A STREP BY PCR: Group A Strep by PCR: NOT DETECTED

## 2017-09-16 MED ORDER — DEXAMETHASONE 4 MG PO TABS
12.0000 mg | ORAL_TABLET | Freq: Once | ORAL | Status: AC
  Administered 2017-09-16: 12 mg via ORAL
  Filled 2017-09-16: qty 3

## 2017-09-16 MED ORDER — ACETAMINOPHEN 500 MG PO TABS
1000.0000 mg | ORAL_TABLET | Freq: Once | ORAL | Status: AC
  Administered 2017-09-16: 1000 mg via ORAL
  Filled 2017-09-16: qty 2

## 2017-09-16 MED ORDER — IBUPROFEN 600 MG PO TABS
600.0000 mg | ORAL_TABLET | Freq: Once | ORAL | Status: AC
  Administered 2017-09-16: 600 mg via ORAL
  Filled 2017-09-16: qty 1

## 2017-09-16 NOTE — ED Triage Notes (Signed)
Patient with complaint of sore throat and headache that started this morning.

## 2017-09-16 NOTE — ED Notes (Addendum)
Pt with multiple medical complaints-sore throat, headache, chills and feeling nauseated; says she would have been here sooner but she had to work; pt ambulatory with steady gait; talking in complete coherent sentences

## 2017-09-16 NOTE — Discharge Instructions (Signed)
Please make sure you remain well-hydrated and use ibuprofen and Tylenol as needed for symptomatic control.  I fully expect you to be sick for another 2-3 days.  Return to the emergency department for any concerns whatsoever.  It was a pleasure to take care of you today, and thank you for coming to our emergency department.  If you have any questions or concerns before leaving please ask the nurse to grab me and I'm more than happy to go through your aftercare instructions again.  If you were prescribed any opioid pain medication today such as Norco, Vicodin, Percocet, morphine, hydrocodone, or oxycodone please make sure you do not drive when you are taking this medication as it can alter your ability to drive safely.  If you have any concerns once you are home that you are not improving or are in fact getting worse before you can make it to your follow-up appointment, please do not hesitate to call 911 and come back for further evaluation.  Merrily BrittleNeil Derec Mozingo, MD  Results for orders placed or performed during the hospital encounter of 09/16/17  Group A Strep by PCR  Result Value Ref Range   Group A Strep by PCR NOT DETECTED NOT DETECTED

## 2017-09-16 NOTE — ED Provider Notes (Signed)
[facility] Emergency Department Provider Note  ____________________________________________   First MD Initiated Contact with Patient 09/16/17 [phone]     (approximate)  I have reviewed the triage vital signs and the nursing notes.  HISTORY  Chief Complaint Sore Throat and Headache   HPI Alexa Dunn is a 32 y.o. female who self presents to the emergency department with 2-3 days of sore throat, low-grade fever, dry cough, and malaise.  She has taken no medications over-the-counter and nothing seems to make her symptoms better.  She did get a flu shot this year.  Her primary concern is congestion and sore throat.  Past Medical History:  Diagnosis Date  . Diabetes mellitus without complication (HCC)     There are no active problems to display for this patient.   Past Surgical History:  Procedure Laterality Date  . TONSILLECTOMY      Prior to Admission medications   Medication Sig Start Date End Date Taking? Authorizing Provider  albuterol (PROVENTIL HFA;VENTOLIN HFA) 108 (90 Base) MCG/ACT inhaler Inhale 1-2 puffs into the lungs every 6 (six) hours as needed for wheezing or shortness of breath. 05/26/16   Hagler, Jami L, PA-C  azithromycin (ZITHROMAX Z-PAK) 250 MG tablet Take 2 tablets (500 mg) on  Day 1,  followed by 1 tablet (250 mg) once daily on Days 2 through 5. 05/26/16   Hagler, Jami L, PA-C  benzonatate (TESSALON PERLES) 100 MG capsule Take 1 capsule (100 mg total) by mouth 3 (three) times daily as needed for cough. 04/25/16   Hagler, Jami L, PA-C  brompheniramine-pseudoephedrine-DM 30-2-10 MG/5ML syrup Take 10 mLs by mouth 4 (four) times daily as needed. 05/26/16   Hagler, Jami L, PA-C  fexofenadine-pseudoephedrine (ALLEGRA-D) 60-120 MG 12 hr tablet Take 1 tablet by mouth 2 (two) times daily. 08/24/17   Joni ReiningSmith, Ronald K, PA-C  fluticasone (FLONASE) 50 MCG/ACT nasal spray Place 2 sprays into both nostrils daily. 04/25/16   Hagler, Jami L, PA-C  glipiZIDE  (GLUCOTROL) 10 MG tablet Take 10 mg by mouth daily before breakfast.    [provider]  metFORMIN (GLUCOPHAGE) 1000 MG tablet Take 500 mg by mouth 2 (two) times daily with a meal.     [provider]  naproxen (NAPROSYN) 500 MG tablet Take 1 tablet (500 mg total) by mouth 2 (two) times daily with a meal. 08/24/17   Joni ReiningSmith, Ronald K, PA-C  predniSONE (DELTASONE) 10 MG tablet Take a daily regimen of 6,5,4,3,2,1 05/26/16   Hagler, Jami L, PA-C  promethazine (PHENERGAN) 12.5 MG tablet Take 1 tablet (12.5 mg total) by mouth every 6 (six) hours as needed for nausea or vomiting. 04/10/17   Willy Eddyobinson, Patrick, MD  sulfamethoxazole-trimethoprim (BACTRIM DS,SEPTRA DS) 800-160 MG tablet Take 1 tablet by mouth 2 (two) times daily. 08/24/17   Joni ReiningSmith, Ronald K, PA-C    Allergies Penicillins  No family history on file.  Social History Social History   Tobacco Use  . Smoking status: Current Every Day Smoker    Packs/day: 1.00  . Smokeless tobacco: Never Used  Substance Use Topics  . Alcohol use: No  . Drug use: No    Review of Systems Constitutional: Positive for fever ENT: Positive for sore throat. Cardiovascular: Denies chest pain. Respiratory: Positive for cough Gastrointestinal: No abdominal pain.  No nausea, no vomiting.  No diarrhea.  No constipation. Musculoskeletal: Negative for back pain. Neurological: Negative for headaches   ____________________________________________   PHYSICAL EXAM:  VITAL SIGNS: ED Triage Vitals [  09/16/17 0247]  Enc Vitals Group     BP 138/77     Pulse Rate 94     Resp 18     Temp 97.9 F (36.6 C)     Temp Source Oral     SpO2 96 %     Weight 200 lb (90.7 kg)     Height 5\' 5"  (1.651 m)     Head Circumference      Peak Flow      Pain Score 7     Pain Loc      Pain Edu?      Excl. in GC?     Constitutional: Alert and oriented x4 speaks with somewhat hoarse voice no diaphoresis speaks in full clear sentences Head:  Atraumatic. Nose: No congestion/rhinnorhea. Mouth/Throat: No trismus uvula midline no pharyngeal exudate but she does have mild erythema and a small lesion Neck: No stridor.  No lymphadenopathy Cardiovascular: Regular rate and rhythm Respiratory: Normal respiratory effort.  No retractions.  Clear to auscultation bilaterally Gastrointestinal: Soft nontender Neurologic:  Normal speech and language. No gross focal neurologic deficits are appreciated.  Skin:  Skin is warm, dry and intact. No rash noted.    ____________________________________________  LABS (all labs ordered are listed, but only abnormal results are displayed)  Labs Reviewed  GROUP A STREP BY PCR    Lab work reviewed by me negative for strep __________________________________________  EKG   ____________________________________________  RADIOLOGY   ____________________________________________   DIFFERENTIAL includes but not limited to  Strep pharyngitis, viral pharyngitis, upper respiratory tract infection, bronchitis, pneumonia   PROCEDURES  Procedure(s) performed: no  Procedures  Critical Care performed: no  Observation: no ____________________________________________   INITIAL IMPRESSION / ASSESSMENT AND PLAN / ED COURSE  Pertinent labs & imaging results that were available during my care of the patient were reviewed by me and considered in my medical decision making (see chart for details).  The patient has generalized malaise sore throat and upper respiratory tract symptoms.  Her strep is negative and her throat is erythematous consistent with viral pharyngitis.  She feels improved after Tylenol and ibuprofen.  A single dose of dexamethasone given.  We will also give her 2 days off work.  Strict return precautions have been given and the patient verbalizes understanding and agreement the plan.      ____________________________________________   FINAL CLINICAL IMPRESSION(S) / ED  DIAGNOSES  Final diagnoses:  Viral pharyngitis      NEW MEDICATIONS STARTED DURING THIS VISIT:  This SmartLink is deprecated. Use AVSMEDLIST instead to display the medication list for a patient.   Note:  This document was prepared using Dragon voice recognition software and may include unintentional dictation errors.      Merrily Brittle, MD 09/16/17 [phone]

## 2017-11-24 ENCOUNTER — Emergency Department: Admission: EM | Admit: 2017-11-24 | Discharge: 2017-11-24 | Discharge status: Against Medical Advice | Payer: 59

## 2018-05-30 ENCOUNTER — Encounter: Payer: Self-pay | Admitting: Emergency Medicine

## 2018-05-30 ENCOUNTER — Emergency Department: Payer: 59

## 2018-05-30 ENCOUNTER — Emergency Department
Admission: EM | Admit: 2018-05-30 | Discharge: 2018-05-30 | Disposition: A | Discharge status: Home / Self Care | Payer: 59 | Attending: Emergency Medicine | Admitting: Emergency Medicine

## 2018-05-30 ENCOUNTER — Other Ambulatory Visit: Payer: Self-pay

## 2018-05-30 DIAGNOSIS — B349 Viral infection, unspecified: Secondary | ICD-10-CM | POA: Insufficient documentation

## 2018-05-30 DIAGNOSIS — F17210 Nicotine dependence, cigarettes, uncomplicated: Secondary | ICD-10-CM | POA: Insufficient documentation

## 2018-05-30 DIAGNOSIS — R6889 Other general symptoms and signs: Secondary | ICD-10-CM

## 2018-05-30 DIAGNOSIS — E1165 Type 2 diabetes mellitus with hyperglycemia: Secondary | ICD-10-CM | POA: Insufficient documentation

## 2018-05-30 DIAGNOSIS — R739 Hyperglycemia, unspecified: Secondary | ICD-10-CM

## 2018-05-30 DIAGNOSIS — J4531 Mild persistent asthma with (acute) exacerbation: Secondary | ICD-10-CM | POA: Insufficient documentation

## 2018-05-30 LAB — INFLUENZA PANEL BY PCR (TYPE A & B)
INFLAPCR: NEGATIVE
INFLBPCR: NEGATIVE

## 2018-05-30 LAB — COMPREHENSIVE METABOLIC PANEL
ALT: 13 U/L (ref 0–44)
AST: 19 U/L (ref 15–41)
Albumin: 3.8 g/dL (ref 3.5–5.0)
Alkaline Phosphatase: 46 U/L (ref 38–126)
Anion gap: 11 (ref 5–15)
BUN: 8 mg/dL (ref 6–20)
CO2: 20 mmol/L — ABNORMAL LOW (ref 22–32)
Calcium: 8.9 mg/dL (ref 8.9–10.3)
Chloride: 99 mmol/L (ref 98–111)
Creatinine, Ser: 0.65 mg/dL (ref 0.44–1.00)
GFR calc Af Amer: >60 mL/min (ref >60)
GFR calc non Af Amer: >60 mL/min (ref >60)
Glucose, Bld: 355 mg/dL — ABNORMAL HIGH (ref 70–99)
Potassium: 4.1 mmol/L (ref 3.5–5.1)
Sodium: 130 mmol/L — ABNORMAL LOW (ref 135–145)
Total Bilirubin: 1 mg/dL (ref 0.3–1.2)
Total Protein: 7.3 g/dL (ref 6.5–8.1)

## 2018-05-30 LAB — CBC WITH DIFFERENTIAL/PLATELET
BASOS ABS: 0 10*3/uL (ref 0–0.1)
Basophils Relative: 0 %
Eosinophils Absolute: 0 10*3/uL (ref 0–0.7)
Eosinophils Relative: 0 %
HEMATOCRIT: 42.6 % (ref 35.0–47.0)
Hemoglobin: 14.8 g/dL (ref 12.0–16.0)
LYMPHS ABS: 0.9 10*3/uL — AB (ref 1.0–3.6)
LYMPHS PCT: 14 %
MCH: 27.7 pg (ref 26.0–34.0)
MCHC: 34.8 g/dL (ref 32.0–36.0)
MCV: 79.6 fL — AB (ref 80.0–100.0)
Monocytes Absolute: 0.4 10*3/uL (ref 0.2–0.9)
Monocytes Relative: 6 %
Neutro Abs: 5.1 10*3/uL (ref 1.4–6.5)
Neutrophils Relative %: 80 %
Platelets: 256 10*3/uL (ref 150–440)
RBC: 5.34 MIL/uL — AB (ref 3.80–5.20)
RDW: 13.7 % (ref 11.5–14.5)
WBC: 6.4 10*3/uL (ref 3.6–11.0)

## 2018-05-30 LAB — URINALYSIS, COMPLETE (UACMP) WITH MICROSCOPIC
BILIRUBIN URINE: NEGATIVE
Glucose, UA: >=500 mg/dL — AB
Ketones, ur: NEGATIVE mg/dL
LEUKOCYTES UA: NEGATIVE
Nitrite: NEGATIVE
PH: 6 (ref 5.0–8.0)
PROTEIN: 100 mg/dL — AB
Specific Gravity, Urine: 1.031 — ABNORMAL HIGH (ref 1.005–1.030)

## 2018-05-30 LAB — POCT PREGNANCY, URINE: Preg Test, Ur: NEGATIVE

## 2018-05-30 MED ORDER — SODIUM CHLORIDE 0.9 % IV SOLN
Freq: Once | INTRAVENOUS | Status: AC
  Administered 2018-05-30: 20:00:00 via INTRAVENOUS

## 2018-05-30 MED ORDER — IPRATROPIUM-ALBUTEROL 0.5-2.5 (3) MG/3ML IN SOLN
3.0000 mL | Freq: Once | RESPIRATORY_TRACT | Status: AC
  Administered 2018-05-30: 3 mL via RESPIRATORY_TRACT
  Filled 2018-05-30: qty 3

## 2018-05-30 MED ORDER — KETOROLAC TROMETHAMINE 30 MG/ML IJ SOLN
30.0000 mg | Freq: Once | INTRAMUSCULAR | Status: AC
  Administered 2018-05-30: 30 mg via INTRAVENOUS
  Filled 2018-05-30: qty 1

## 2018-05-30 MED ORDER — IBUPROFEN 800 MG PO TABS: 800.0000 mg | ORAL_TABLET | Freq: Three times a day (TID) | ORAL | 0 refills | Status: DC | PRN

## 2018-05-30 MED ORDER — METHYLPREDNISOLONE SODIUM SUCC 125 MG IJ SOLR
125.0000 mg | Freq: Once | INTRAMUSCULAR | Status: AC
  Administered 2018-05-30: 125 mg via INTRAVENOUS
  Filled 2018-05-30: qty 2

## 2018-05-30 MED ORDER — SODIUM CHLORIDE 0.9 % IV SOLN
INTRAVENOUS | Status: AC
  Administered 2018-05-30: 1 g via INTRAVENOUS
  Filled 2018-05-30: qty 10

## 2018-05-30 MED ORDER — MORPHINE SULFATE (PF) 4 MG/ML IV SOLN
4.0000 mg | Freq: Once | INTRAVENOUS | Status: AC
  Administered 2018-05-30: 4 mg via INTRAVENOUS
  Filled 2018-05-30: qty 1

## 2018-05-30 MED ORDER — HYDROCOD POLST-CPM POLST ER 10-8 MG/5ML PO SUER: 5.0000 mL | Freq: Two times a day (BID) | ORAL | 0 refills | Status: DC

## 2018-05-30 MED ORDER — SODIUM CHLORIDE 0.9 % IV SOLN
1.0000 g | Freq: Once | INTRAVENOUS | Status: AC
  Administered 2018-05-30: 1 g via INTRAVENOUS

## 2018-05-30 NOTE — ED Triage Notes (Signed)
Pt comes into the ED via POV c/o generalized body aches, cough, and congestion.  Patient thinks she may have the flu.  Patient states she has had the body aches x 2 days.  Patient denies checking her temperature at home.  Patient has even and unlabored respirations at this time. Denies any N/V/D.

## 2018-05-30 NOTE — ED Provider Notes (Signed)
[facility] Emergency Department Provider Note       Time seen: ----------------------------------------- 8:00 PM on 05/30/2018 -----------------------------------------   I have reviewed the triage vital signs and the nursing notes.  HISTORY   Chief Complaint Generalized Body Aches    HPI Alexa Dunn is a 32 y.o. female with a history of diabetes who presents to the ED for generalized body aches as well as cough and congestion.  Patient states she has had a cough for over a week.  She also has a history of asthma, she is concerned she may currently have the flu.  For the past 2 days she has had bad body aches and general ill feeling.  She denies nausea, vomiting or diarrhea.  She denies any sore throat but does have a headache.  Past Medical History:  Diagnosis Date  . Diabetes mellitus without complication (HCC)     There are no active problems to display for this patient.   Past Surgical History:  Procedure Laterality Date  . TONSILLECTOMY      Allergies Penicillins  Social History Social History   Tobacco Use  . Smoking status: Current Every Day Smoker    Packs/day: 1.00  . Smokeless tobacco: Never Used  Substance Use Topics  . Alcohol use: No  . Drug use: No   Review of Systems Constitutional: Positive for fevers, chills, body aches ENT: Positive for congestion Cardiovascular: Negative for chest pain. Respiratory: Negative for shortness of breath.  Positive for cough Gastrointestinal: Negative for abdominal pain, vomiting and diarrhea. Musculoskeletal: Negative for back pain.  Positive for myalgias Skin: Negative for rash. Neurological: Negative for headaches, focal weakness or numbness.  All systems negative/normal/unremarkable except as stated in the HPI  ____________________________________________   PHYSICAL EXAM:  VITAL SIGNS: ED Triage Vitals  Enc Vitals Group     BP 05/30/18 1944 131/81     Pulse Rate 05/30/18  1944 (!) 118     Resp 05/30/18 1944 (!) 26     Temp 05/30/18 1944 99.2 F (37.3 C)     Temp Source 05/30/18 1944 Oral     SpO2 05/30/18 1944 98 %     Weight 05/30/18 1940 195 lb (88.5 kg)     Height 05/30/18 1940 5\' 5"  (1.651 m)     Head Circumference --      Peak Flow --      Pain Score 05/30/18 1940 9     Pain Loc --      Pain Edu? --      Excl. in GC? --    Constitutional: Alert and oriented.  Mild distress Eyes: Conjunctivae are normal. Normal extraocular movements. ENT   Head: Normocephalic and atraumatic.   Nose: No congestion/rhinnorhea.   Mouth/Throat: Mucous membranes are moist.   Neck: No stridor. Cardiovascular: Normal rate, regular rhythm. No murmurs, rubs, or gallops. Respiratory: Normal respiratory effort without tachypnea nor retractions.  Bilateral mild wheezing and rhonchi Gastrointestinal: Soft and nontender. Normal bowel sounds Musculoskeletal: Nontender with normal range of motion in extremities. No lower extremity tenderness nor edema. Neurologic:  Normal speech and language. No gross focal neurologic deficits are appreciated.  Skin:  Skin is warm, dry and intact. No rash noted. Psychiatric: Mood and affect are normal. Speech and behavior are normal.  ____________________________________________  ED COURSE:  As part of my medical decision making, I reviewed the following data within the electronic MEDICAL RECORD NUMBER History obtained from family if available, nursing notes, old chart and  ekg, as well as notes from prior ED visits. Patient presented for flulike symptoms, we will assess with labs and imaging as indicated at this time.   Procedures ____________________________________________   LABS (pertinent positives/negatives)  Labs Reviewed  COMPREHENSIVE METABOLIC PANEL - Abnormal; Notable for the following components:      Result Value   Sodium 130 (*)    CO2 20 (*)    Glucose, Bld 355 (*)    All other components within normal limits   CBC WITH DIFFERENTIAL/PLATELET - Abnormal; Notable for the following components:   RBC 5.34 (*)    MCV 79.6 (*)    Lymphs Abs 0.9 (*)    All other components within normal limits  URINALYSIS, COMPLETE (UACMP) WITH MICROSCOPIC - Abnormal; Notable for the following components:   Color, Urine YELLOW (*)    APPearance HAZY (*)    Specific Gravity, Urine 1.031 (*)    Glucose, UA >=500 (*)    Hgb urine dipstick SMALL (*)    Protein, ur 100 (*)    Bacteria, UA RARE (*)    All other components within normal limits  INFLUENZA PANEL BY PCR (TYPE A & B)  POC URINE PREG, ED  POCT PREGNANCY, URINE    RADIOLOGY  Chest x-ray Not reveal any acute process ____________________________________________  DIFFERENTIAL DIAGNOSIS   Dehydration, electrolyte abnormality, pneumonia, URI, influenza  FINAL ASSESSMENT AND PLAN  Viral syndrome, asthma exacerbation, hyperglycemia   Plan: The patient had presented for flulike symptoms. Patient's labs unremarkable other than hyperglycemia with a blood sugar 355. Patient's imaging was negative, no pneumonia was identified.  She was given fluids as well as a DuoNeb and 1 dose of Solu-Medrol here.  I will prescribe cough medicine for her and encourage anti-inflammatory medicine.  Otherwise she is cleared for outpatient follow-up.   Ulice DashJohnathan E Genea Rheaume, MD   Note: This note was generated in part or whole with voice recognition software. Voice recognition is usually quite accurate but there are transcription errors that can and very often do occur. I apologize for any typographical errors that were not detected and corrected.     Emily FilbertWilliams, Shatha Hooser E, MD 05/30/18 [phone]

## 2018-09-08 ENCOUNTER — Emergency Department: Payer: Self-pay

## 2018-09-08 ENCOUNTER — Emergency Department
Admission: EM | Admit: 2018-09-08 | Discharge: 2018-09-08 | Disposition: A | Discharge status: Home / Self Care | Payer: Self-pay | Attending: Emergency Medicine | Admitting: Emergency Medicine

## 2018-09-08 ENCOUNTER — Other Ambulatory Visit: Payer: Self-pay

## 2018-09-08 DIAGNOSIS — E119 Type 2 diabetes mellitus without complications: Secondary | ICD-10-CM | POA: Insufficient documentation

## 2018-09-08 DIAGNOSIS — Z7984 Long term (current) use of oral hypoglycemic drugs: Secondary | ICD-10-CM | POA: Insufficient documentation

## 2018-09-08 DIAGNOSIS — F17210 Nicotine dependence, cigarettes, uncomplicated: Secondary | ICD-10-CM | POA: Insufficient documentation

## 2018-09-08 DIAGNOSIS — R05 Cough: Secondary | ICD-10-CM | POA: Insufficient documentation

## 2018-09-08 DIAGNOSIS — J111 Influenza due to unidentified influenza virus with other respiratory manifestations: Secondary | ICD-10-CM | POA: Insufficient documentation

## 2018-09-08 DIAGNOSIS — Z79899 Other long term (current) drug therapy: Secondary | ICD-10-CM | POA: Insufficient documentation

## 2018-09-08 DIAGNOSIS — N390 Urinary tract infection, site not specified: Secondary | ICD-10-CM | POA: Insufficient documentation

## 2018-09-08 DIAGNOSIS — R69 Illness, unspecified: Secondary | ICD-10-CM

## 2018-09-08 DIAGNOSIS — M7918 Myalgia, other site: Secondary | ICD-10-CM | POA: Insufficient documentation

## 2018-09-08 LAB — URINALYSIS, COMPLETE (UACMP) WITH MICROSCOPIC
Bilirubin Urine: NEGATIVE
Ketones, ur: 20 mg/dL — AB
NITRITE: NEGATIVE
PROTEIN: 100 mg/dL — AB
SPECIFIC GRAVITY, URINE: 1.037 — AB (ref 1.005–1.030)
pH: 6 (ref 5.0–8.0)

## 2018-09-08 LAB — CBC WITH DIFFERENTIAL/PLATELET
Abs Immature Granulocytes: 0.09 10*3/uL — ABNORMAL HIGH (ref 0.00–0.07)
BASOS ABS: 0.1 10*3/uL (ref 0.0–0.1)
BASOS PCT: 0 %
EOS ABS: 0 10*3/uL (ref 0.0–0.5)
EOS PCT: 0 %
HCT: 43.1 % (ref 36.0–46.0)
Hemoglobin: 14.1 g/dL (ref 12.0–15.0)
Immature Granulocytes: 1 %
Lymphocytes Relative: 10 %
Lymphs Abs: 1.4 10*3/uL (ref 0.7–4.0)
MCH: 26.5 pg (ref 26.0–34.0)
MCHC: 32.7 g/dL (ref 30.0–36.0)
MCV: 80.9 fL (ref 80.0–100.0)
MONO ABS: 0.7 10*3/uL (ref 0.1–1.0)
Monocytes Relative: 5 %
NRBC: 0 % (ref 0.0–0.2)
Neutro Abs: 11.8 10*3/uL — ABNORMAL HIGH (ref 1.7–7.7)
Neutrophils Relative %: 84 %
Platelets: 240 10*3/uL (ref 150–400)
RBC: 5.33 MIL/uL — AB (ref 3.87–5.11)
RDW: 12.9 % (ref 11.5–15.5)
WBC: 14 10*3/uL — AB (ref 4.0–10.5)

## 2018-09-08 LAB — COMPREHENSIVE METABOLIC PANEL
ALT: 11 U/L (ref 0–44)
AST: 16 U/L (ref 15–41)
Albumin: 3.6 g/dL (ref 3.5–5.0)
Alkaline Phosphatase: 47 U/L (ref 38–126)
Anion gap: 11 (ref 5–15)
BILIRUBIN TOTAL: 0.8 mg/dL (ref 0.3–1.2)
BUN: 7 mg/dL (ref 6–20)
CO2: 22 mmol/L (ref 22–32)
CREATININE: 0.58 mg/dL (ref 0.44–1.00)
Calcium: 8.4 mg/dL — ABNORMAL LOW (ref 8.9–10.3)
Chloride: 101 mmol/L (ref 98–111)
GFR calc Af Amer: >60 mL/min (ref >60)
Glucose, Bld: 340 mg/dL — ABNORMAL HIGH (ref 70–99)
POTASSIUM: 3.5 mmol/L (ref 3.5–5.1)
Sodium: 134 mmol/L — ABNORMAL LOW (ref 135–145)
TOTAL PROTEIN: 7 g/dL (ref 6.5–8.1)

## 2018-09-08 LAB — INFLUENZA PANEL BY PCR (TYPE A & B)
INFLAPCR: NEGATIVE
INFLBPCR: NEGATIVE

## 2018-09-08 LAB — GLUCOSE, CAPILLARY
Glucose-Capillary: 381 mg/dL — ABNORMAL HIGH (ref 70–99)
Glucose-Capillary: 256 mg/dL — ABNORMAL HIGH (ref 70–99)

## 2018-09-08 LAB — POCT PREGNANCY, URINE: Preg Test, Ur: NEGATIVE

## 2018-09-08 MED ORDER — METFORMIN HCL 1000 MG PO TABS: 500.0000 mg | ORAL_TABLET | Freq: Two times a day (BID) | ORAL | 2 refills | Status: DC

## 2018-09-08 MED ORDER — SODIUM CHLORIDE 0.9 % IV BOLUS
1000.0000 mL | Freq: Once | INTRAVENOUS | Status: AC
  Administered 2018-09-08: 1000 mL via INTRAVENOUS

## 2018-09-08 MED ORDER — ONDANSETRON HCL 4 MG/2ML IJ SOLN
4.0000 mg | Freq: Once | INTRAMUSCULAR | Status: AC
  Administered 2018-09-08: 4 mg via INTRAVENOUS
  Filled 2018-09-08: qty 2

## 2018-09-08 MED ORDER — INSULIN ASPART 100 UNIT/ML ~~LOC~~ SOLN
10.0000 [IU] | Freq: Once | SUBCUTANEOUS | Status: AC
  Administered 2018-09-08: 10 [IU] via SUBCUTANEOUS
  Filled 2018-09-08: qty 1

## 2018-09-08 MED ORDER — ONDANSETRON 4 MG PO TBDP: 4.0000 mg | ORAL_TABLET | Freq: Three times a day (TID) | ORAL | 0 refills | Status: DC | PRN

## 2018-09-08 MED ORDER — BENZONATATE 200 MG PO CAPS: 200.0000 mg | ORAL_CAPSULE | Freq: Three times a day (TID) | ORAL | 0 refills | Status: DC | PRN

## 2018-09-08 MED ORDER — METFORMIN HCL 500 MG PO TABS
500.0000 mg | ORAL_TABLET | Freq: Once | ORAL | Status: AC
  Administered 2018-09-08: 500 mg via ORAL
  Filled 2018-09-08: qty 1

## 2018-09-08 MED ORDER — CEFDINIR 300 MG PO CAPS: 300.0000 mg | ORAL_CAPSULE | Freq: Two times a day (BID) | ORAL | 0 refills | Status: DC

## 2018-09-08 NOTE — Discharge Instructions (Addendum)
[facility] with [facility] doctor or [facility].  Please call for an appointment.  Start taking your metformin as you were previously prescribed.  Use the other medications as prescribed.  If you are worsening please return emergency department.  Drink plenty of fluids to help with muscle aches.

## 2018-09-08 NOTE — ED Triage Notes (Addendum)
Pt c/o bodyaches with fever, cough congestion since Friday. "I think I have the flu" last took 600mg  IBU at 830am

## 2018-09-08 NOTE — ED Provider Notes (Signed)
[facility] Emergency Department Provider Note  ____________________________________________   First MD Initiated Contact with Patient 09/08/18 1202     (approximate)  I have reviewed the triage vital signs and the nursing notes.   HISTORY  Chief Complaint URI    HPI Alexa GunningKayla N Dunn is a 32 y.o. female complains of flulike symptoms, patient is complained of fever, chills, body aches.  Positive cough, positive sore throat, positive vomiting, denies diarrhea; denies chest pain or sob.  Sx for 3 days.  The patient is also out of her metformin.  She has not taken it in several weeks.   Past Medical History:  Diagnosis Date  . Diabetes mellitus without complication (HCC)     There are no active problems to display for this patient.   Past Surgical History:  Procedure Laterality Date  . TONSILLECTOMY      Prior to Admission medications   Medication Sig Start Date End Date Taking? Authorizing Provider  albuterol (PROVENTIL HFA;VENTOLIN HFA) 108 (90 Base) MCG/ACT inhaler Inhale 1-2 puffs into the lungs every 6 (six) hours as needed for wheezing or shortness of breath. 05/26/16   Hagler, Jami L, PA-C  benzonatate (TESSALON) 200 MG capsule Take 1 capsule (200 mg total) by mouth 3 (three) times daily as needed for cough. 09/08/18   Sherrie MustacheFisher, Roselyn BeringSusan W, PA-C  cefdinir (OMNICEF) 300 MG capsule Take 1 capsule (300 mg total) by mouth 2 (two) times daily. 09/08/18   Ajamu Maxon, Roselyn BeringSusan W, PA-C  glipiZIDE (GLUCOTROL) 10 MG tablet Take 10 mg by mouth daily before breakfast.    [provider]  ibuprofen (ADVIL,MOTRIN) 800 MG tablet Take 1 tablet (800 mg total) by mouth every 8 (eight) hours as needed. 05/30/18   Emily FilbertWilliams, Jonathan E, MD  metFORMIN (GLUCOPHAGE) 1000 MG tablet Take 0.5 tablets (500 mg total) by mouth 2 (two) times daily with a meal. 09/08/18   Neysa Arts, Roselyn BeringSusan W, PA-C  ondansetron (ZOFRAN-ODT) 4 MG disintegrating tablet Take 1 tablet (4 mg total) by mouth every  8 (eight) hours as needed for nausea or vomiting. 09/08/18   Faythe GheeFisher, Arvel Oquinn W, PA-C    Allergies Penicillins  History reviewed. No pertinent family history.  Social History Social History   Tobacco Use  . Smoking status: Current Every Day Smoker    Packs/day: 1.00  . Smokeless tobacco: Never Used  Substance Use Topics  . Alcohol use: No  . Drug use: No    Review of Systems  Constitutional: Positive fever/chills and body aches Eyes: No visual changes. ENT: No sore throat. Respiratory: Positive cough Gastrointestinal: Positive for nausea/vomiting, last vomited prior to arrival Genitourinary: Negative for dysuria. Musculoskeletal: Negative for back pain. Skin: Negative for rash.    ____________________________________________   PHYSICAL EXAM:  VITAL SIGNS: ED Triage Vitals [09/08/18 1149]  Enc Vitals Group     BP 117/85     Pulse Rate (!) 103     Resp 20     Temp 98.6 F (37 C)     Temp Source Oral     SpO2 100 %     Weight 180 lb (81.6 kg)     Height 5\' 5"  (1.651 m)     Head Circumference      Peak Flow      Pain Score 7     Pain Loc      Pain Edu?      Excl. in GC?     Constitutional: Alert and oriented.  No acute distress.  Patient does not appear to feel well  eyes: Conjunctivae are normal.  Head: Atraumatic. Nose: No congestion/rhinnorhea. Mouth/Throat: Mucous membranes are moist.  Throat appears normal Neck:  supple no lymphadenopathy noted Cardiovascular: Normal rate, regular rhythm. Heart sounds are normal Respiratory: Normal respiratory effort.  No retractions, lungs c t a  Abd: soft nontender bs normal all 4 quad GU: deferred Musculoskeletal: FROM all extremities, warm and well perfused Neurologic:  Normal speech and language.  Skin:  Skin is warm, dry and intact. No rash noted. Psychiatric: Mood and affect are normal. Speech and behavior are normal.  ____________________________________________   LABS (all labs ordered are listed, but  only abnormal results are displayed)  Labs Reviewed  COMPREHENSIVE METABOLIC PANEL - Abnormal; Notable for the following components:      Result Value   Sodium 134 (*)    Glucose, Bld 340 (*)    Calcium 8.4 (*)    All other components within normal limits  CBC WITH DIFFERENTIAL/PLATELET - Abnormal; Notable for the following components:   WBC 14.0 (*)    RBC 5.33 (*)    Neutro Abs 11.8 (*)    Abs Immature Granulocytes 0.09 (*)    All other components within normal limits  URINALYSIS, COMPLETE (UACMP) WITH MICROSCOPIC - Abnormal; Notable for the following components:   Color, Urine YELLOW (*)    APPearance CLOUDY (*)    Specific Gravity, Urine 1.037 (*)    Glucose, UA >=500 (*)    Hgb urine dipstick SMALL (*)    Ketones, ur 20 (*)    Protein, ur 100 (*)    Leukocytes, UA SMALL (*)    Bacteria, UA FEW (*)    All other components within normal limits  GLUCOSE, CAPILLARY - Abnormal; Notable for the following components:   Glucose-Capillary 381 (*)    All other components within normal limits  INFLUENZA PANEL BY PCR (TYPE A & B)  POCT PREGNANCY, URINE  POC URINE PREG, ED  CBG MONITORING, ED   ____________________________________________   ____________________________________________  RADIOLOGY  Chest x-ray is negative for pneumonia  ____________________________________________   PROCEDURES  Procedure(s) performed: Saline lock, 2 L normal saline, Zofran 4 mg IV, insulin 10 units regular subcu, metformin 500 mg p.o.  Procedures    ____________________________________________   INITIAL IMPRESSION / ASSESSMENT AND PLAN / ED COURSE  Pertinent labs & imaging results that were available during my care of the patient were reviewed by me and considered in my medical decision making (see chart for details).   Patient is 32 year old diabetic female presents emergency department flulike symptoms.  She has been out of her metformin for several weeks.  She has cough,  congestion, and vomiting.  Physical exam shows a nontoxic patient.  She does appear to not feel well.  Cough is dry and hacking, there is no active vomiting while she is in the exam room.   Saline lock, normal saline 1 L IV, Zofran 4 mg IV  Urinalysis is cloudy specific gravity is elevated, greater than 500 glucose in the urine, small amount of blood, 20 ketones, small leuks; CBC with elevated WBC at 14; POC pregnancy is negative, comprehensive metabolic panel shows a sodium at 134 which is decreased and a elevated glucose at 340, chest x-ray is negative for pneumonia  Patient was given another liter of normal saline due to the elevated glucose, point-of-care glucose after the first liter was 381, the patient was instructed to stop drinking the ginger ale which has sugar  in it.  She was given metformin 500 mg p.o., insulin 10 regular subcu.  Glucose on recheck after the insulin and metformin along with finishing the second liter of fluid is 256  Discussed all the findings with patient.  She is to follow-up at [facility].  She is given prescription for Tessalon Perles, cefdinir, metformin, and Zofran.  She is to take these medications as prescribed.  Return emergency department worsening.  She is to stay out of work until she has been fever free for 24 hours.  She states she understands will comply.  She was discharged in stable condition   As part of my medical decision making, I reviewed the following data within the electronic MEDICAL RECORD NUMBER History obtained from family, Nursing notes reviewed and incorporated, Labs reviewed see above, Old chart reviewed, Radiograph reviewed see above, Notes from prior ED visits and [place] Controlled Substance Database  ____________________________________________   FINAL CLINICAL IMPRESSION(S) / ED DIAGNOSES  Final diagnoses:  Influenza-like illness  Urinary tract infection without hematuria, site unspecified      NEW MEDICATIONS STARTED DURING THIS  VISIT:  New Prescriptions   BENZONATATE (TESSALON) 200 MG CAPSULE    Take 1 capsule (200 mg total) by mouth 3 (three) times daily as needed for cough.   CEFDINIR (OMNICEF) 300 MG CAPSULE    Take 1 capsule (300 mg total) by mouth 2 (two) times daily.   ONDANSETRON (ZOFRAN-ODT) 4 MG DISINTEGRATING TABLET    Take 1 tablet (4 mg total) by mouth every 8 (eight) hours as needed for nausea or vomiting.     Note:  This document was prepared using Dragon voice recognition software and may include unintentional dictation errors.    Faythe Ghee, PA-C 09/08/18 1607    Sharman Cheek, MD 09/16/18 [phone]

## 2018-11-29 ENCOUNTER — Emergency Department: Payer: Self-pay

## 2018-11-29 ENCOUNTER — Emergency Department
Admission: EM | Admit: 2018-11-29 | Discharge: 2018-11-29 | Disposition: A | Discharge status: Home / Self Care | Payer: Self-pay | Attending: Emergency Medicine | Admitting: Emergency Medicine

## 2018-11-29 ENCOUNTER — Other Ambulatory Visit: Payer: Self-pay

## 2018-11-29 ENCOUNTER — Encounter: Payer: Self-pay | Admitting: Emergency Medicine

## 2018-11-29 DIAGNOSIS — R1012 Left upper quadrant pain: Secondary | ICD-10-CM | POA: Insufficient documentation

## 2018-11-29 DIAGNOSIS — F17210 Nicotine dependence, cigarettes, uncomplicated: Secondary | ICD-10-CM | POA: Insufficient documentation

## 2018-11-29 DIAGNOSIS — E119 Type 2 diabetes mellitus without complications: Secondary | ICD-10-CM | POA: Insufficient documentation

## 2018-11-29 DIAGNOSIS — Z79899 Other long term (current) drug therapy: Secondary | ICD-10-CM | POA: Insufficient documentation

## 2018-11-29 DIAGNOSIS — R0789 Other chest pain: Secondary | ICD-10-CM | POA: Insufficient documentation

## 2018-11-29 HISTORY — DX: Unspecified asthma, uncomplicated: J45.909

## 2018-11-29 LAB — CBC WITH DIFFERENTIAL/PLATELET
Abs Immature Granulocytes: 0.02 10*3/uL (ref 0.00–0.07)
Basophils Absolute: 0.1 10*3/uL (ref 0.0–0.1)
Basophils Relative: 1 %
EOS PCT: 1 %
Eosinophils Absolute: 0.1 10*3/uL (ref 0.0–0.5)
HCT: 40.9 % (ref 36.0–46.0)
Hemoglobin: 13.4 g/dL (ref 12.0–15.0)
Immature Granulocytes: 0 %
Lymphocytes Relative: 30 %
Lymphs Abs: 2.1 10*3/uL (ref 0.7–4.0)
MCH: 26.9 pg (ref 26.0–34.0)
MCHC: 32.8 g/dL (ref 30.0–36.0)
MCV: 82.1 fL (ref 80.0–100.0)
Monocytes Absolute: 0.4 10*3/uL (ref 0.1–1.0)
Monocytes Relative: 6 %
Neutro Abs: 4.4 10*3/uL (ref 1.7–7.7)
Neutrophils Relative %: 62 %
Platelets: 242 10*3/uL (ref 150–400)
RBC: 4.98 MIL/uL (ref 3.87–5.11)
RDW: 13.3 % (ref 11.5–15.5)
WBC: 7 10*3/uL (ref 4.0–10.5)
nRBC: 0 % (ref 0.0–0.2)

## 2018-11-29 LAB — COMPREHENSIVE METABOLIC PANEL
ALT: 16 U/L (ref 0–44)
AST: 14 U/L — ABNORMAL LOW (ref 15–41)
Albumin: 3.7 g/dL (ref 3.5–5.0)
Alkaline Phosphatase: 33 U/L — ABNORMAL LOW (ref 38–126)
Anion gap: 6 (ref 5–15)
BILIRUBIN TOTAL: 0.5 mg/dL (ref 0.3–1.2)
BUN: 13 mg/dL (ref 6–20)
CO2: 22 mmol/L (ref 22–32)
Calcium: 8.3 mg/dL — ABNORMAL LOW (ref 8.9–10.3)
Chloride: 106 mmol/L (ref 98–111)
Creatinine, Ser: 0.58 mg/dL (ref 0.44–1.00)
GFR calc Af Amer: >60 mL/min (ref >60)
Glucose, Bld: 315 mg/dL — ABNORMAL HIGH (ref 70–99)
Potassium: 3.8 mmol/L (ref 3.5–5.1)
Sodium: 134 mmol/L — ABNORMAL LOW (ref 135–145)
Total Protein: 6.7 g/dL (ref 6.5–8.1)

## 2018-11-29 LAB — URINALYSIS, COMPLETE (UACMP) WITH MICROSCOPIC
Bacteria, UA: NONE SEEN
Bilirubin Urine: NEGATIVE
Glucose, UA: >=500 mg/dL — AB
Hgb urine dipstick: NEGATIVE
Ketones, ur: NEGATIVE mg/dL
Leukocytes,Ua: NEGATIVE
Nitrite: NEGATIVE
Protein, ur: 100 mg/dL — AB
Specific Gravity, Urine: 1.028 (ref 1.005–1.030)
pH: 6 (ref 5.0–8.0)

## 2018-11-29 LAB — POCT PREGNANCY, URINE: Preg Test, Ur: NEGATIVE

## 2018-11-29 LAB — LIPASE, BLOOD: LIPASE: 28 U/L (ref 11–51)

## 2018-11-29 MED ORDER — HYDROMORPHONE HCL 1 MG/ML IJ SOLN
0.5000 mg | Freq: Once | INTRAMUSCULAR | Status: AC
  Administered 2018-11-29: 0.5 mg via INTRAVENOUS
  Filled 2018-11-29: qty 1

## 2018-11-29 MED ORDER — HYDROCODONE-ACETAMINOPHEN 5-325 MG PO TABS: 1.0000 | ORAL_TABLET | Freq: Four times a day (QID) | ORAL | 0 refills | Status: DC | PRN

## 2018-11-29 MED ORDER — IOHEXOL 300 MG/ML  SOLN
100.0000 mL | Freq: Once | INTRAMUSCULAR | Status: AC | PRN
  Administered 2018-11-29: 100 mL via INTRAVENOUS

## 2018-11-29 MED ORDER — ONDANSETRON HCL 4 MG/2ML IJ SOLN
4.0000 mg | Freq: Once | INTRAMUSCULAR | Status: AC
  Administered 2018-11-29: 4 mg via INTRAVENOUS
  Filled 2018-11-29: qty 2

## 2018-11-29 MED ORDER — IOPAMIDOL (ISOVUE-300) INJECTION 61%
30.0000 mL | Freq: Once | INTRAVENOUS | Status: AC
  Administered 2018-11-29: 30 mL via ORAL

## 2018-11-29 NOTE — ED Notes (Signed)
Patient transported to CT 

## 2018-11-29 NOTE — ED Provider Notes (Signed)
[facility] Emergency Department Provider Note   ____________________________________________   First MD Initiated Contact with Patient 11/29/18 [phone]     (approximate)  I have reviewed the triage vital signs and the nursing notes.   HISTORY  Chief Complaint Abdominal Pain   HPI Alexa Dunn is a 33 y.o. female who reports left upper quadrant abdominal pain for about a month.  She thinks is little swollen in that area.  Gotten worse in the last several days.  Gets worse if she eats.  It is also worse with palpation.         Past Medical History:  Diagnosis Date  . Asthma   . Diabetes mellitus without complication (HCC)     There are no active problems to display for this patient.   Past Surgical History:  Procedure Laterality Date  . TONSILLECTOMY      Prior to Admission medications   Medication Sig Start Date End Date Taking? Authorizing Provider  metFORMIN (GLUCOPHAGE) 1000 MG tablet Take 0.5 tablets (500 mg total) by mouth 2 (two) times daily with a meal. 09/08/18  Yes Fisher, Roselyn Bering, PA-C  albuterol (PROVENTIL HFA;VENTOLIN HFA) 108 (90 Base) MCG/ACT inhaler Inhale 1-2 puffs into the lungs every 6 (six) hours as needed for wheezing or shortness of breath. Patient not taking: Reported on 11/29/2018 05/26/16   Hagler, Jami L, PA-C  benzonatate (TESSALON) 200 MG capsule Take 1 capsule (200 mg total) by mouth 3 (three) times daily as needed for cough. Patient not taking: Reported on 11/29/2018 09/08/18   Faythe Ghee, PA-C  cefdinir (OMNICEF) 300 MG capsule Take 1 capsule (300 mg total) by mouth 2 (two) times daily. Patient not taking: Reported on 11/29/2018 09/08/18   Faythe Ghee, PA-C  HYDROcodone-acetaminophen (NORCO/VICODIN) 5-325 MG tablet Take 1 tablet by mouth every 6 (six) hours as needed for moderate pain. 11/29/18   Arnaldo Natal, MD  ibuprofen (ADVIL,MOTRIN) 800 MG tablet Take 1 tablet (800 mg total) by mouth every 8 (eight)  hours as needed. Patient not taking: Reported on 11/29/2018 05/30/18   Emily Filbert, MD  ondansetron (ZOFRAN-ODT) 4 MG disintegrating tablet Take 1 tablet (4 mg total) by mouth every 8 (eight) hours as needed for nausea or vomiting. Patient not taking: Reported on 11/29/2018 09/08/18   Faythe Ghee, PA-C    Allergies Penicillins  No family history on file.  Social History Social History   Tobacco Use  . Smoking status: Current Every Day Smoker    Packs/day: 1.00  . Smokeless tobacco: Never Used  Substance Use Topics  . Alcohol use: No  . Drug use: No    Review of Systems  Constitutional: No fever/chills Eyes: No visual changes. ENT: No sore throat. Cardiovascular: Denies chest pain. Respiratory: Denies shortness of breath. Gastrointestinal: See HPI Genitourinary: Negative for dysuria. Musculoskeletal: Negative for back pain. Skin: Negative for rash. Neurological: Negative for headaches, focal weakness  ____________________________________________   PHYSICAL EXAM:  VITAL SIGNS: ED Triage Vitals  Enc Vitals Group     BP 11/29/18 1029 138/90     Pulse Rate 11/29/18 1029 79     Resp 11/29/18 1029 16     Temp 11/29/18 1029 97.9 F (36.6 C)     Temp Source 11/29/18 1029 Oral     SpO2 11/29/18 1029 99 %     Weight 11/29/18 0935 200 lb (90.7 kg)     Height 11/29/18 0935 5\' 5"  (1.651 m)  Head Circumference --      Peak Flow --      Pain Score 11/29/18 0935 7     Pain Loc --      Pain Edu? --      Excl. in GC? --     Constitutional: Alert and oriented. Well appearing and in no acute distress. Eyes: Conjunctivae are normal. Head: Atraumatic. Nose: No congestion/rhinnorhea. Mouth/Throat: Mucous membranes are moist.  Oropharynx non-erythematous. Neck: No stridor.   Cardiovascular: Normal rate, regular rhythm. Grossly normal heart sounds.  Good peripheral circulation. Respiratory: Normal respiratory effort.  No retractions. Lungs CTAB.  Gastrointestinal: Soft and nontender except in the left upper quadrant where it is tender to palpation and percussion. No distention. No abdominal bruits. No CVA tenderness. Musculoskeletal: No lower extremity tenderness nor edema.  No joint effusions. Neurologic:  Normal speech and language. No gross focal neurologic deficits are appreciated. Skin:  Skin is warm, dry and intact. No rash noted. Psychiatric: Mood and affect are normal. Speech and behavior are normal.  ____________________________________________   LABS (all labs ordered are listed, but only abnormal results are displayed)  Labs Reviewed  COMPREHENSIVE METABOLIC PANEL - Abnormal; Notable for the following components:      Result Value   Sodium 134 (*)    Glucose, Bld 315 (*)    Calcium 8.3 (*)    AST 14 (*)    Alkaline Phosphatase 33 (*)    All other components within normal limits  URINALYSIS, COMPLETE (UACMP) WITH MICROSCOPIC - Abnormal; Notable for the following components:   Color, Urine YELLOW (*)    APPearance CLEAR (*)    Glucose, UA >=500 (*)    Protein, ur 100 (*)    All other components within normal limits  LIPASE, BLOOD  CBC WITH DIFFERENTIAL/PLATELET  POCT PREGNANCY, URINE   ____________________________________________  EKG   ____________________________________________  RADIOLOGY  ED MD interpretation: CT read by radiology reviewed by me shows some stomach distention is physiological and incomplete visualization of the colon due to the contrast not having passed all the way in.  Official radiology report(s): Ct Abdomen Pelvis W Contrast  Result Date: 11/29/2018 CLINICAL DATA:  Left upper abdominal pain x weeks, worsening. Nausea. EXAM: CT ABDOMEN AND PELVIS WITH CONTRAST TECHNIQUE: Multidetector CT imaging of the abdomen and pelvis was performed using the standard protocol following bolus administration of intravenous contrast. CONTRAST:  OMNIPAQUE IOHEXOL 300 MG/ML  SOLN COMPARISON:   08/26/2015 FINDINGS: Lower chest: No acute abnormality. Hepatobiliary: No focal liver abnormality is seen. No gallstones, gallbladder wall thickening, or biliary dilatation. Pancreas: Unremarkable. No pancreatic ductal dilatation or surrounding inflammatory changes. Spleen: Normal in size without focal abnormality. Adrenals/Urinary Tract: Adrenal glands are unremarkable. Kidneys are normal, without renal calculi, focal lesion, or hydronephrosis. Bladder is unremarkable. Stomach/Bowel: Stomach is physiologically distended. Small bowel is nondilated. Incomplete distal passage of the oral contrast material. Normal appendix. Colon is nondilated. Incomplete distention of the sigmoid colon limiting evaluation. Vascular/Lymphatic: No significant vascular findings are present. No enlarged abdominal or pelvic lymph nodes. Reproductive: Normal uterus. 3 cm left ovarian cyst. Right adnexal region unremarkable. Other: No ascites.  Bilateral pelvic phleboliths.  No free air. 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 Disc bulges L3-S1. Negative for fracture or worrisome bone lesion. IMPRESSION: No acute findings. Electronically Signed   By: Corlis Leak M.D.   On: 11/29/2018 12:43    ____________________________________________   PROCEDURES  Procedure(s) performed (including Critical Care):  Procedures   ____________________________________________  INITIAL IMPRESSION / ASSESSMENT AND PLAN / ED COURSE  As I explained to the patient not sure exactly why she is having pain.  He does not have a rash in the area of the pain.  There is not any really marked swelling there no bruising.  There is some pain in the upper left abdomen but also pain in the ribs just above the abdomen.  CT scan covered that whole area there is nothing that can be seen.  I told her to return if she gets a rash.  The other possibility perhaps is that there is some early gastroparesis she has been a diabetic for many years.   Could also be possibly some nerve pain from her diabetes.  I would be a little unusual but possibly possible I will have her follow-up with GI return if she is worse and follow-up with her doctor as well.      ____________________________________________   FINAL CLINICAL IMPRESSION(S) / ED DIAGNOSES  Final diagnoses:  Left upper quadrant pain  Chest wall pain     ED Discharge Orders         Ordered    HYDROcodone-acetaminophen (NORCO/VICODIN) 5-325 MG tablet  Every 6 hours PRN     11/29/18 1548           Note:  This document was prepared using Dragon voice recognition software and may include unintentional dictation errors.    Arnaldo NatalMalinda, Paul F, MD 11/29/18 1550

## 2018-11-29 NOTE — ED Triage Notes (Signed)
Pt states LUQ pain for the past few weeks now, increasing the past few days. Occasional nausea, states site under left breast feels inflamed. NAD.

## 2018-11-29 NOTE — ED Notes (Signed)
Pt does have her gall bladder.

## 2018-11-29 NOTE — Discharge Instructions (Signed)
Not sure what is causing the pain.  All the test that we did today were negative.  Please return here if you get a rash in the area where the pain is.  That could be shingles and we can treat that.  Please also return for worsening pain fever vomiting or feeling sicker.  The pain possibly could be a little bit of early gastroparesis.  I am not sure that that is what it is but you can follow-up with Dr. Norma Fredrickson the gastroenterologist.  He can check for that.  I will give you some Vicodin 1 4 times a day as needed for pain or just a day or so to see if that helps.  Please also return if the pain gets worse or if you are not any better in the next 3 or 4 days also you can see your doctor.

## 2019-01-18 ENCOUNTER — Emergency Department: Payer: Self-pay

## 2019-01-18 ENCOUNTER — Emergency Department
Admission: EM | Admit: 2019-01-18 | Discharge: 2019-01-18 | Disposition: A | Discharge status: Home / Self Care | Payer: Self-pay | Attending: Emergency Medicine | Admitting: Emergency Medicine

## 2019-01-18 ENCOUNTER — Other Ambulatory Visit: Payer: Self-pay

## 2019-01-18 ENCOUNTER — Encounter: Payer: Self-pay | Admitting: Emergency Medicine

## 2019-01-18 DIAGNOSIS — J45909 Unspecified asthma, uncomplicated: Secondary | ICD-10-CM | POA: Insufficient documentation

## 2019-01-18 DIAGNOSIS — Z7984 Long term (current) use of oral hypoglycemic drugs: Secondary | ICD-10-CM | POA: Insufficient documentation

## 2019-01-18 DIAGNOSIS — Y9389 Activity, other specified: Secondary | ICD-10-CM | POA: Insufficient documentation

## 2019-01-18 DIAGNOSIS — Y92008 Other place in unspecified non-institutional (private) residence as the place of occurrence of the external cause: Secondary | ICD-10-CM | POA: Insufficient documentation

## 2019-01-18 DIAGNOSIS — S93601A Unspecified sprain of right foot, initial encounter: Secondary | ICD-10-CM | POA: Insufficient documentation

## 2019-01-18 DIAGNOSIS — F17200 Nicotine dependence, unspecified, uncomplicated: Secondary | ICD-10-CM | POA: Insufficient documentation

## 2019-01-18 DIAGNOSIS — W108XXA Fall (on) (from) other stairs and steps, initial encounter: Secondary | ICD-10-CM | POA: Insufficient documentation

## 2019-01-18 DIAGNOSIS — Y998 Other external cause status: Secondary | ICD-10-CM | POA: Insufficient documentation

## 2019-01-18 DIAGNOSIS — E119 Type 2 diabetes mellitus without complications: Secondary | ICD-10-CM | POA: Insufficient documentation

## 2019-01-18 MED ORDER — MELOXICAM 15 MG PO TABS: 15.0000 mg | ORAL_TABLET | Freq: Every day | ORAL | 1 refills | Status: AC

## 2019-01-18 NOTE — ED Triage Notes (Signed)
R foot pain since fell yesterday evening.

## 2019-01-18 NOTE — ED Provider Notes (Signed)
[facility] Emergency Department Provider Note  ____________________________________________  Time seen: Approximately 7:13 PM  I have reviewed the triage vital signs and the nursing notes.   HISTORY  Chief Complaint Foot Pain    HPI Alexa Dunn is a 33 y.o. female presents to the emergency department with acute right foot pain.  Patient rates her pain at 8 out of 10 in intensity and states that most of her pain is along the plantar aspect of the right foot.  Patient describes a fall from her front steps as a cause of the injury.  She has not been able to bear weight on the right foot since injury occurred.  No numbness or tingling in the right foot.  She denies an inversion type ankle injury.  No prior right foot issues in the past.  Patient has been taking Tylenol for pain.        Past Medical History:  Diagnosis Date  . Asthma   . Diabetes mellitus without complication (HCC)     There are no active problems to display for this patient.   Past Surgical History:  Procedure Laterality Date  . TONSILLECTOMY      Prior to Admission medications   Medication Sig Start Date End Date Taking? Authorizing Provider  albuterol (PROVENTIL HFA;VENTOLIN HFA) 108 (90 Base) MCG/ACT inhaler Inhale 1-2 puffs into the lungs every 6 (six) hours as needed for wheezing or shortness of breath. Patient not taking: Reported on 11/29/2018 05/26/16   Hagler, Jami L, PA-C  benzonatate (TESSALON) 200 MG capsule Take 1 capsule (200 mg total) by mouth 3 (three) times daily as needed for cough. Patient not taking: Reported on 11/29/2018 09/08/18   Faythe Ghee, PA-C  cefdinir (OMNICEF) 300 MG capsule Take 1 capsule (300 mg total) by mouth 2 (two) times daily. Patient not taking: Reported on 11/29/2018 09/08/18   Faythe Ghee, PA-C  HYDROcodone-acetaminophen (NORCO/VICODIN) 5-325 MG tablet Take 1 tablet by mouth every 6 (six) hours as needed for moderate pain. Patient not  taking: Reported on 01/18/2019 11/29/18   Arnaldo Natal, MD  ibuprofen (ADVIL,MOTRIN) 800 MG tablet Take 1 tablet (800 mg total) by mouth every 8 (eight) hours as needed. Patient not taking: Reported on 11/29/2018 05/30/18   Emily Filbert, MD  meloxicam (MOBIC) 15 MG tablet Take 1 tablet (15 mg total) by mouth daily for 7 days. 01/18/19 01/25/19  Orvil Feil, PA-C  metFORMIN (GLUCOPHAGE) 1000 MG tablet Take 0.5 tablets (500 mg total) by mouth 2 (two) times daily with a meal. 09/08/18   Fisher, Roselyn Bering, PA-C  ondansetron (ZOFRAN-ODT) 4 MG disintegrating tablet Take 1 tablet (4 mg total) by mouth every 8 (eight) hours as needed for nausea or vomiting. Patient not taking: Reported on 11/29/2018 09/08/18   Faythe Ghee, PA-C    Allergies Penicillins  No family history on file.  Social History Social History   Tobacco Use  . Smoking status: Current Every Day Smoker    Packs/day: 1.00  . Smokeless tobacco: Never Used  Substance Use Topics  . Alcohol use: No  . Drug use: No     Review of Systems  Constitutional: No fever/chills Eyes: No visual changes. No discharge ENT: No upper respiratory complaints. Cardiovascular: no chest pain. Respiratory: no cough. No SOB. Gastrointestinal: No abdominal pain.  No nausea, no vomiting.  No diarrhea.  No constipation. Musculoskeletal: Patient has right foot pain.  Skin: Negative for rash, abrasions, lacerations, ecchymosis. Neurological:  Negative for headaches, focal weakness or numbness.  ____________________________________________   PHYSICAL EXAM:  VITAL SIGNS: ED Triage Vitals  Enc Vitals Group     BP 01/18/19 1659 137/81     Pulse Rate 01/18/19 1659 92     Resp 01/18/19 1659 20     Temp 01/18/19 1659 98.2 F (36.8 C)     Temp Source 01/18/19 1659 Oral     SpO2 01/18/19 1659 100 %     Weight 01/18/19 1700 193 lb (87.5 kg)     Height 01/18/19 1700  (1.651 m)     Head Circumference --      Peak Flow --      Pain  Score 01/18/19 1659 7     Pain Loc --      Pain Edu? --      Excl. in GC? --      Constitutional: Alert and oriented. Well appearing and in no acute distress. Eyes: Conjunctivae are normal. PERRL. EOMI. Head: Atraumatic. Cardiovascular: Normal rate, regular rhythm. Normal S1 and S2.  Good peripheral circulation. Respiratory: Normal respiratory effort without tachypnea or retractions. Lungs CTAB. Good air entry to the bases with no decreased or absent breath sounds. Musculoskeletal: Patient has pain with palpation along the plantar aspect of the right foot along the metatarsals.  She is able to move all 5 right toes.  She can perform full range of motion at the right ankle.  Palpable dorsalis pedis pulse, right. Neurologic:  Normal speech and language. No gross focal neurologic deficits are appreciated.  Skin:  Skin is warm, dry and intact. No rash noted. Psychiatric: Mood and affect are normal. Speech and behavior are normal. Patient exhibits appropriate insight and judgement.   ____________________________________________   LABS (all labs ordered are listed, but only abnormal results are displayed)  Labs Reviewed - No data to display ____________________________________________  EKG   ____________________________________________  RADIOLOGY I personally viewed and evaluated these images as part of my medical decision making, as well as reviewing the written report by the radiologist.  Dg Foot Complete Right  Result Date: 01/18/2019 CLINICAL DATA:  33 year old female status post fall down stairs in rain last night. Pain and unable to weightbear. EXAM: RIGHT FOOT COMPLETE - 3+ VIEW COMPARISON:  None. FINDINGS: Bone mineralization is within normal limits. There is no evidence of fracture or dislocation. There is no evidence of arthropathy or other focal bone abnormality. No discrete soft tissue injury. IMPRESSION: No acute fracture or dislocation identified about the right foot.  Electronically Signed   By: Odessa Fleming M.D.   On: 01/18/2019 18:06    ____________________________________________    PROCEDURES  Procedure(s) performed:    Procedures    Medications - No data to display   ____________________________________________   INITIAL IMPRESSION / ASSESSMENT AND PLAN / ED COURSE  Pertinent labs & imaging results that were available during my care of the patient were reviewed by me and considered in my medical decision making (see chart for details).  Review of the [place] CSRS was performed in accordance of the [facility] prior to dispensing any controlled drugs.         Assessment and Plan:  Right foot pain 33 year old female presents to the emergency department with acute right foot pain after a fall that occurred yesterday.  On physical exam, patient had pain to palpation along the plantar aspect of the right foot over metatarsals.  She had no pain to palpation along the heel or along the  anterior and posterior talofibular ligaments.  She was able to move all 5 right toes.  An Ace wrap was applied in the emergency department and crutches were provided.  Patient was discharged with meloxicam for pain.  She was advised to follow-up with podiatry.  All patient questions were answered.     ____________________________________________  FINAL CLINICAL IMPRESSION(S) / ED DIAGNOSES  Final diagnoses:  Sprain of right foot, initial encounter      NEW MEDICATIONS STARTED DURING THIS VISIT:  ED Discharge Orders         Ordered    meloxicam (MOBIC) 15 MG tablet  Daily     01/18/19 1918              This chart was dictated using voice recognition software/Dragon. Despite best efforts to proofread, errors can occur which can change the meaning. Any change was purely unintentional.    Orvil FeilWoods, Louie Flenner M, PA-C 01/18/19 1918    Emily FilbertWilliams, Jonathan E, MD 01/18/19 [phone]

## 2019-01-18 NOTE — ED Notes (Addendum)
Pt fell yesterday and states continues to have R foot and ankle pain all over; denies twisting foot during fall; slight swelling noted around ankle/foot; appropriate color/warmth/pulses palpable/cap refill <3. Pt states tried elevating leg and ice last night without relief.

## 2019-03-17 ENCOUNTER — Encounter: Payer: Self-pay | Admitting: Emergency Medicine

## 2019-03-17 ENCOUNTER — Other Ambulatory Visit: Payer: Self-pay

## 2019-03-17 ENCOUNTER — Emergency Department: Admission: EM | Admit: 2019-03-17 | Discharge: 2019-03-17 | Discharge status: Against Medical Advice | Payer: Self-pay

## 2019-03-17 NOTE — ED Triage Notes (Signed)
Pt c/o left upper quad abdominal pain since March. Pt seen for symptoms in past without diagnosis. Pt reports N/V/D since March as well. Pt denies fever.

## 2019-06-16 ENCOUNTER — Other Ambulatory Visit: Payer: Self-pay

## 2019-06-16 DIAGNOSIS — J45909 Unspecified asthma, uncomplicated: Secondary | ICD-10-CM | POA: Insufficient documentation

## 2019-06-16 DIAGNOSIS — E119 Type 2 diabetes mellitus without complications: Secondary | ICD-10-CM | POA: Insufficient documentation

## 2019-06-16 DIAGNOSIS — Z7984 Long term (current) use of oral hypoglycemic drugs: Secondary | ICD-10-CM | POA: Insufficient documentation

## 2019-06-16 DIAGNOSIS — F17210 Nicotine dependence, cigarettes, uncomplicated: Secondary | ICD-10-CM | POA: Insufficient documentation

## 2019-06-16 DIAGNOSIS — M7551 Bursitis of right shoulder: Secondary | ICD-10-CM | POA: Insufficient documentation

## 2019-06-16 DIAGNOSIS — M25511 Pain in right shoulder: Secondary | ICD-10-CM | POA: Insufficient documentation

## 2019-06-16 NOTE — ED Triage Notes (Addendum)
Pt injured her shoulder 3 months ago and pt has not seen a MD. Pt denies any new injury today. Pt says the pain has been consistent but worse this week. Pt then states that she heklped someone move a couch 3 weeks ago and the shoulder pain became worse.

## 2019-06-17 ENCOUNTER — Emergency Department
Admission: EM | Admit: 2019-06-17 | Discharge: 2019-06-17 | Disposition: A | Discharge status: Home / Self Care | Payer: Self-pay | Attending: Emergency Medicine | Admitting: Emergency Medicine

## 2019-06-17 ENCOUNTER — Emergency Department: Payer: Self-pay

## 2019-06-17 DIAGNOSIS — M25511 Pain in right shoulder: Secondary | ICD-10-CM

## 2019-06-17 DIAGNOSIS — M7551 Bursitis of right shoulder: Secondary | ICD-10-CM

## 2019-06-17 MED ORDER — KETOROLAC TROMETHAMINE 30 MG/ML IJ SOLN
30.0000 mg | Freq: Once | INTRAMUSCULAR | Status: AC
Start: 2019-06-17 — End: 2019-06-17
  Administered 2019-06-17: 30 mg via INTRAMUSCULAR
  Filled 2019-06-17: qty 1

## 2019-06-17 MED ORDER — IBUPROFEN 800 MG PO TABS: 800.0000 mg | ORAL_TABLET | Freq: Three times a day (TID) | ORAL | 0 refills | Status: DC | PRN

## 2019-06-17 MED ORDER — OXYCODONE-ACETAMINOPHEN 5-325 MG PO TABS: 1.0000 | ORAL_TABLET | ORAL | 0 refills | Status: DC | PRN

## 2019-06-17 NOTE — ED Provider Notes (Signed)
[facility] Emergency Department Provider Note   ____________________________________________   First MD Initiated Contact with Patient 06/17/19 [phone]     (approximate)  I have reviewed the triage vital signs and the nursing notes.   HISTORY  Chief Complaint Shoulder Pain    HPI Alexa Dunn is a 33 y.o. female who presents to the ED from home with a chief complaint of right shoulder pain x3 months.  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  Reinjured it 3 weeks ago helping a friend move a couch.  Complains of pain and limited motion since.  Patient is right-hand dominant.  Denies extremity weakness, numbness or tingling.  Voices no other complaints or injuries.       Past Medical History:  Diagnosis Date  . Asthma   . Diabetes mellitus without complication (HCC)     There are no active problems to display for this patient.   Past Surgical History:  Procedure Laterality Date  . CESAREAN SECTION    . TONSILLECTOMY      Prior to Admission medications   Medication Sig Start Date End Date Taking? Authorizing Provider  albuterol (PROVENTIL HFA;VENTOLIN HFA) 108 (90 Base) MCG/ACT inhaler Inhale 1-2 puffs into the lungs every 6 (six) hours as needed for wheezing or shortness of breath. Patient not taking: Reported on 11/29/2018 05/26/16   Hagler, Jami L, PA-C  benzonatate (TESSALON) 200 MG capsule Take 1 capsule (200 mg total) by mouth 3 (three) times daily as needed for cough. Patient not taking: Reported on 11/29/2018 09/08/18   Faythe GheeFisher, Susan W, PA-C  cefdinir (OMNICEF) 300 MG capsule Take 1 capsule (300 mg total) by mouth 2 (two) times daily. Patient not taking: Reported on 11/29/2018 09/08/18   Faythe GheeFisher, Susan W, PA-C  HYDROcodone-acetaminophen (NORCO/VICODIN) 5-325 MG tablet Take 1 tablet by mouth every 6 (six) hours as needed for  moderate pain. Patient not taking: Reported on 01/18/2019 11/29/18   Arnaldo NatalMalinda, Paul F, MD  ibuprofen (ADVIL) 800 MG tablet Take 1 tablet (800 mg total) by mouth every 8 (eight) hours as needed for moderate pain. 06/17/19   Irean HongSung, Aimie Wagman J, MD  metFORMIN (GLUCOPHAGE) 1000 MG tablet Take 0.5 tablets (500 mg total) by mouth 2 (two) times daily with a meal. 09/08/18   Fisher, Roselyn BeringSusan W, PA-C  ondansetron (ZOFRAN-ODT) 4 MG disintegrating tablet Take 1 tablet (4 mg total) by mouth every 8 (eight) hours as needed for nausea or vomiting. Patient not taking: Reported on 11/29/2018 09/08/18   Faythe GheeFisher, Susan W, PA-C  oxyCODONE-acetaminophen (PERCOCET/ROXICET) 5-325 MG tablet Take 1 tablet by mouth every 4 (four) hours as needed for severe pain. 06/17/19   Irean HongSung, Dajanae Brophy J, MD    Allergies Penicillins  No family history on file.  Social History Social History   Tobacco Use  . Smoking status: Current Every Day Smoker    Packs/day: 1.00  . Smokeless tobacco: Never Used  Substance Use Topics  . Alcohol use: No  . Drug use: No    Review of Systems  Constitutional: No fever/chills Eyes: No visual changes. ENT: No sore throat. Cardiovascular: Denies chest pain. Respiratory: Denies shortness of breath. Gastrointestinal: No abdominal pain.  No nausea, no vomiting.  No diarrhea.  No constipation. Genitourinary: Negative for dysuria. Musculoskeletal: Positive for right shoulder pain.  Negative for back pain. Skin: Negative for rash. Neurological:  Negative for headaches, focal weakness or numbness.   ____________________________________________   PHYSICAL EXAM:  VITAL SIGNS: ED Triage Vitals  Enc Vitals Group     BP 06/16/19 2349 (!) 149/84     Pulse Rate 06/16/19 2349 82     Resp 06/16/19 2349 16     Temp 06/16/19 2349 98.2 F (36.8 C)     Temp Source 06/16/19 2349 Oral     SpO2 06/16/19 2349 98 %     Weight 06/16/19 2345 190 lb (86.2 kg)     Height 06/16/19 2345 5\' 5"  (1.651 m)     Head  Circumference --      Peak Flow --      Pain Score 06/16/19 2344 7     Pain Loc --      Pain Edu? --      Excl. in GC? --     Constitutional: Alert and oriented. Well appearing and in no acute distress. Eyes: Conjunctivae are normal. PERRL. EOMI. Head: Atraumatic. Nose: Atraumatic. Mouth/Throat: Mucous membranes are moist.  No dental malocclusion. Neck: No stridor.  No cervical spine tenderness to palpation. Cardiovascular: Normal rate, regular rhythm. Grossly normal heart sounds.  Good peripheral circulation. Respiratory: Normal respiratory effort.  No retractions. Lungs CTAB. Gastrointestinal: Soft and nontender. No distention. No abdominal bruits. No CVA tenderness. Musculoskeletal: Right shoulder with mild swelling.  Tender to palpation diffusely.  Limited range of motion secondary to pain.  2+ radial pulses.  Brisk, less than 5-second capillary refill. No lower extremity tenderness nor edema.  No joint effusions. Neurologic:  Normal speech and language. No gross focal neurologic deficits are appreciated. No gait instability. Skin:  Skin is warm, dry and intact. No rash noted. Psychiatric: Mood and affect are normal. Speech and behavior are normal.  ____________________________________________   LABS (all labs ordered are listed, but only abnormal results are displayed)  Labs Reviewed - No data to display ____________________________________________  EKG  None ____________________________________________  RADIOLOGY  ED MD interpretation: Negative  Official radiology report(s): Dg Shoulder Right  Result Date: 06/17/2019 CLINICAL DATA:  Shoulder pain EXAM: RIGHT SHOULDER - 2+ VIEW COMPARISON:  None. FINDINGS: There is no evidence of fracture or dislocation. There is no evidence of arthropathy or other focal bone abnormality. Soft tissues are unremarkable. IMPRESSION: Negative. Electronically Signed   By: 08/17/2019 M.D.   On: 06/17/2019 00:55     ____________________________________________   PROCEDURES  Procedure(s) performed (including Critical Care):  Procedures   ____________________________________________   INITIAL IMPRESSION / ASSESSMENT AND PLAN / ED COURSE  As part of my medical decision making, I reviewed the following data within the electronic MEDICAL RECORD NUMBER Nursing notes reviewed and incorporated, Old chart reviewed, Radiograph reviewed, Notes from prior ED visits and [place] Controlled Substance Database     Alexa Dunn was evaluated in Emergency Department on 06/17/2019 for the symptoms described in the history of present illness. She was evaluated in the context of the global COVID-19 pandemic, which necessitated consideration that the patient might be at risk for infection with the SARS-CoV-2 virus that causes COVID-19. Institutional protocols and algorithms that pertain to the evaluation of patients at risk for COVID-19 are in a state of rapid change based on information released by regulatory bodies including the CDC and federal and state organizations. These policies and algorithms were followed during the patient's care in the ED.    33 year old female who presents with right shoulder pain x3 months.  Bursitis on exam.  Patient is a diabetic with blood sugars above 200; will hold steroid taper.  Treat with NSAIDs, analgesia, sling and patient will follow-up with orthopedics.  Strict return precautions given.  Patient verbalizes understanding agrees with plan of care.      ____________________________________________   FINAL CLINICAL IMPRESSION(S) / ED DIAGNOSES  Final diagnoses:  Acute pain of right shoulder  Bursitis of right shoulder     ED Discharge Orders         Ordered    ibuprofen (ADVIL) 800 MG tablet  Every 8 hours PRN     06/17/19 0319    oxyCODONE-acetaminophen (PERCOCET/ROXICET) 5-325 MG tablet  Every 4 hours PRN     06/17/19 0319           Note:  This document was prepared using  Dragon voice recognition software and may include unintentional dictation errors.   Paulette Blanch, MD 06/17/19 [phone]

## 2019-06-17 NOTE — Discharge Instructions (Signed)
1.  You may take pain medicines as needed (Motrin/Percocet). 2.  Wear sling as needed for comfort. 3.  Return to the ER for worsening symptoms, persistent vomiting, difficulty breathing or other concerns.

## 2019-08-05 ENCOUNTER — Telehealth: Payer: HRSA Program | Admitting: Nurse Practitioner

## 2019-08-05 DIAGNOSIS — R05 Cough: Secondary | ICD-10-CM

## 2019-08-05 DIAGNOSIS — Z20828 Contact with and (suspected) exposure to other viral communicable diseases: Secondary | ICD-10-CM

## 2019-08-05 DIAGNOSIS — R059 Cough, unspecified: Secondary | ICD-10-CM

## 2019-08-05 DIAGNOSIS — Z20822 Contact with and (suspected) exposure to covid-19: Secondary | ICD-10-CM

## 2019-08-05 DIAGNOSIS — R52 Pain, unspecified: Secondary | ICD-10-CM

## 2019-08-05 MED ORDER — BENZONATATE 100 MG PO CAPS: 100.0000 mg | ORAL_CAPSULE | Freq: Three times a day (TID) | ORAL | 0 refills | Status: DC | PRN

## 2019-08-05 NOTE — Progress Notes (Signed)
E-Visit for Corona Virus Screening   Your current symptoms could be consistent with the coronavirus. We are not seeing flu as of yet. Need to rule out covid before we can do anything about th eflu.  Many health care providers can now test patients at their office but not all are.  [facility] has multiple testing sites. For information on our COVID testing locations and hours go to [email or web address]  Please quarantine yourself while awaiting your test results.  We are enrolling you in our MyChart Home [place] for COVID19 . Daily you will receive a questionnaire within the MyChart website. Our COVID 19 response team willl be monitoriing your responses daily. Please continue good preventive care measures, including:  frequent hand-washing, avoid touching your face, cover coughs/sneezes, stay out of crowds and keep a 6 foot distance from others.    You can go to one of the  testing sites listed below, while they are opened (see hours). You do not need a doctors order to be tested for covid.You do need to self-isolate until your results return and if positive 14 days from when your symptoms started and until you are 3 days symptom free.   Testing Locations (Monday - Friday, 10a.m. - 3:30 p.m.)   [place]: [facility] Entrance), [address], [place], [place] -   [place]: [employer] [place] [address], [address] [place], [place] (entrance off [employer]) -   [place] (Closed each Monday): [address] [place], [place] - the short stay covered drive at [facility] (Use the [employer] entrance to [facility] next to [facility].) -    COVID-19 is a respiratory illness with symptoms that are similar to the flu. Symptoms are typically mild to moderate, but there have been cases of severe illness and death due to the virus. The following symptoms may appear 2-14 days  after exposure: . Fever . Cough . Shortness of breath or difficulty breathing . Chills . Repeated shaking with chills . Muscle pain . Headache . Sore throat . New loss of taste or smell . Fatigue . Congestion or runny nose . Nausea or vomiting . Diarrhea  If you develop fever/cough/breathlessness, please stay home for 10 days with improving symptoms and until you have had 24 hours of no fever (without taking a fever reducer).  Go to the nearest hospital ED for assessment if fever/cough/breathlessness are severe or illness seems like a threat to life.  It is vitally important that if you feel that you have an infection such as this virus or any other virus that you stay home and away from places where you may spread it to others.  You should avoid contact with people age 65 and older.   You should wear a mask or cloth face covering over your nose and mouth if you must be around other people or animals, including pets (even at home). Try to stay at least 6 feet away from other people. This will protect the people around you.  You can use medication such as A prescription cough medication called Tessalon Perles 100 mg. You may take 1-2 capsules every 8 hours as needed for cough  You may also take acetaminophen (Tylenol) as needed for fever.   Reduce your risk of any infection by using the same precautions used for avoiding the common cold or flu:  Marland Kitchen Wash your hands often with soap and warm water for at least  20 seconds.  If soap and water are not readily available, use an alcohol-based hand sanitizer with at least 60% alcohol.  . If coughing or sneezing, cover your mouth and nose by coughing or sneezing into the elbow areas of your shirt or coat, into a tissue or into your sleeve (not your hands). . Avoid shaking hands with others and consider head nods or verbal greetings only. . Avoid touching your eyes, nose, or mouth with unwashed hands.  . Avoid close contact with people who are  sick. . Avoid places or events with large numbers of people in one location, like concerts or sporting events. . Carefully consider travel plans you have or are making. . If you are planning any travel outside or inside the [place], visit the CDC's Travelers' Health webpage for the latest health notices. . If you have some symptoms but not all symptoms, continue to monitor at home and seek medical attention if your symptoms worsen. . If you are having a medical emergency, call 911.  HOME CARE . Only take medications as instructed by your medical team. . Drink plenty of fluids and get plenty of rest. . A steam or ultrasonic humidifier can help if you have congestion.   GET HELP RIGHT AWAY IF YOU HAVE EMERGENCY WARNING SIGNS** FOR COVID-19. If you or someone is showing any of these signs seek emergency medical care immediately. Call 911 or proceed to your closest emergency facility if: . You develop worsening high fever. . Trouble breathing . Bluish lips or face . Persistent pain or pressure in the chest . New confusion . Inability to wake or stay awake . You cough up blood. . Your symptoms become more severe  **This list is not all possible symptoms. Contact your medical provider for any symptoms that are sever or concerning to you.   MAKE SURE YOU   Understand these instructions.  Will watch your condition.  Will get help right away if you are not doing well or get worse.  Your e-visit answers were reviewed by a board certified advanced clinical practitioner to complete your personal care plan.  Depending on the condition, your plan could have included both over the counter or prescription medications.  If there is a problem please reply once you have received a response from your provider.  Your safety is important to [place].  If you have drug allergies check your prescription carefully.    You can use MyChart to ask questions about today's visit, request a non-urgent call back, or ask  for a work or school excuse for 24 hours related to this e-Visit. If it has been greater than 24 hours you will need to follow up with your provider, or enter a new e-Visit to address those concerns. You will get an e-mail in the next two days asking about your experience.  I hope that your e-visit has been valuable and will speed your recovery. Thank you for using e-visits.   5-10 minutes spent reviewing and documenting in chart.

## 2019-08-06 ENCOUNTER — Other Ambulatory Visit: Payer: Self-pay

## 2019-08-06 DIAGNOSIS — Z20822 Contact with and (suspected) exposure to covid-19: Secondary | ICD-10-CM

## 2019-08-08 LAB — NOVEL CORONAVIRUS, NAA: SARS-CoV-2, NAA: NOT DETECTED

## 2019-08-13 ENCOUNTER — Other Ambulatory Visit: Payer: Self-pay

## 2019-08-13 ENCOUNTER — Emergency Department (HOSPITAL_COMMUNITY): Payer: No Typology Code available for payment source

## 2019-08-13 ENCOUNTER — Encounter (HOSPITAL_COMMUNITY): Payer: Self-pay | Admitting: Emergency Medicine

## 2019-08-13 ENCOUNTER — Emergency Department (HOSPITAL_COMMUNITY)
Admission: EM | Admit: 2019-08-13 | Discharge: 2019-08-13 | Discharge status: Against Medical Advice | Payer: No Typology Code available for payment source | Attending: Emergency Medicine | Admitting: Emergency Medicine

## 2019-08-13 DIAGNOSIS — R0789 Other chest pain: Secondary | ICD-10-CM | POA: Diagnosis present

## 2019-08-13 DIAGNOSIS — Z5321 Procedure and treatment not carried out due to patient leaving prior to being seen by health care provider: Secondary | ICD-10-CM | POA: Diagnosis not present

## 2019-08-13 LAB — I-STAT CHEM 8, ED
BUN: 20 mg/dL (ref 6–20)
Calcium, Ion: 1.16 mmol/L (ref 1.15–1.40)
Chloride: 100 mmol/L (ref 98–111)
Creatinine, Ser: 0.7 mg/dL (ref 0.44–1.00)
Glucose, Bld: 347 mg/dL — ABNORMAL HIGH (ref 70–99)
HCT: 46 % (ref 36.0–46.0)
Hemoglobin: 15.6 g/dL — ABNORMAL HIGH (ref 12.0–15.0)
Potassium: 4 mmol/L (ref 3.5–5.1)
Sodium: 133 mmol/L — ABNORMAL LOW (ref 135–145)
TCO2: 23 mmol/L (ref 22–32)

## 2019-08-13 LAB — URINALYSIS, ROUTINE W REFLEX MICROSCOPIC
Bacteria, UA: NONE SEEN
Bilirubin Urine: NEGATIVE
Glucose, UA: >=500 mg/dL — AB
Ketones, ur: NEGATIVE mg/dL
Leukocytes,Ua: NEGATIVE
Nitrite: NEGATIVE
Protein, ur: 100 mg/dL — AB
Specific Gravity, Urine: 1.032 — ABNORMAL HIGH (ref 1.005–1.030)
pH: 5 (ref 5.0–8.0)

## 2019-08-13 LAB — CBC
HCT: 44.6 % (ref 36.0–46.0)
Hemoglobin: 14.7 g/dL (ref 12.0–15.0)
MCH: 27.9 pg (ref 26.0–34.0)
MCHC: 33 g/dL (ref 30.0–36.0)
MCV: 84.6 fL (ref 80.0–100.0)
Platelets: 288 10*3/uL (ref 150–400)
RBC: 5.27 MIL/uL — ABNORMAL HIGH (ref 3.87–5.11)
RDW: 12.3 % (ref 11.5–15.5)
WBC: 12.2 10*3/uL — ABNORMAL HIGH (ref 4.0–10.5)
nRBC: 0 % (ref 0.0–0.2)

## 2019-08-13 LAB — COMPREHENSIVE METABOLIC PANEL
ALT: 19 U/L (ref 0–44)
AST: 18 U/L (ref 15–41)
Albumin: 3.9 g/dL (ref 3.5–5.0)
Alkaline Phosphatase: 42 U/L (ref 38–126)
Anion gap: 12 (ref 5–15)
BUN: 18 mg/dL (ref 6–20)
CO2: 21 mmol/L — ABNORMAL LOW (ref 22–32)
Calcium: 9.1 mg/dL (ref 8.9–10.3)
Chloride: 98 mmol/L (ref 98–111)
Creatinine, Ser: 0.86 mg/dL (ref 0.44–1.00)
GFR calc Af Amer: >60 mL/min (ref >60)
GFR calc non Af Amer: >60 mL/min (ref >60)
Glucose, Bld: 363 mg/dL — ABNORMAL HIGH (ref 70–99)
Potassium: 4 mmol/L (ref 3.5–5.1)
Sodium: 131 mmol/L — ABNORMAL LOW (ref 135–145)
Total Bilirubin: 0.7 mg/dL (ref 0.3–1.2)
Total Protein: 7.1 g/dL (ref 6.5–8.1)

## 2019-08-13 LAB — LACTIC ACID, PLASMA: Lactic Acid, Venous: 1.9 mmol/L (ref 0.5–1.9)

## 2019-08-13 LAB — SAMPLE TO BLOOD BANK

## 2019-08-13 LAB — PROTIME-INR
INR: 1.1 (ref 0.8–1.2)
Prothrombin Time: 13.6 seconds (ref 11.4–15.2)

## 2019-08-13 LAB — ETHANOL: Alcohol, Ethyl (B): <10 mg/dL (ref <10)

## 2019-08-13 LAB — CDS SEROLOGY
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
## 2019-08-13 NOTE — ED Notes (Signed)
Pt states she is going home to rest because she is too sore to sit out here and wait. Will return if the symptoms worsen

## 2019-08-14 ENCOUNTER — Telehealth: Payer: No Typology Code available for payment source | Admitting: Physician Assistant

## 2019-08-14 DIAGNOSIS — R52 Pain, unspecified: Secondary | ICD-10-CM

## 2019-08-14 MED ORDER — ACETAMINOPHEN 500 MG PO TABS: 500.0000 mg | ORAL_TABLET | Freq: Four times a day (QID) | ORAL | 0 refills | Status: DC | PRN

## 2019-08-14 MED ORDER — CYCLOBENZAPRINE HCL 10 MG PO TABS: 10.0000 mg | ORAL_TABLET | Freq: Two times a day (BID) | ORAL | 0 refills | Status: DC | PRN

## 2019-08-14 MED ORDER — NAPROXEN 500 MG PO TABS: 500.0000 mg | ORAL_TABLET | Freq: Two times a day (BID) | ORAL | 0 refills | Status: DC

## 2019-08-14 NOTE — Progress Notes (Signed)
We are sorry that you are not feeling well.  Here is how we plan to help!  Based on what you have shared with me it looks like you mostly have acute back pain. I reviewed your labwork, EKG, and imaging while you were in the emergency department and your workup is reassuring. Your chest xray showed no signs of rib fracture or collapsed lung. I think it would be reasonable to treat for muscle soreness and inflammation; however IF YOU HAVE BRUISING ON YOUR CHEST, ABDOMEN, PELVIS, OR SPINE, I would recommend evaluation at an urgent care or emergency department for further workup.   I have prescribed Naprosyn 500 mg take one by mouth twice a day non-steroid anti-inflammatory (NSAID) as well as Flexeril 10 mg every eight hours as needed which is a muscle relaxer  Some patients experience stomach irritation or in increased heartburn with anti-inflammatory drugs. You can also take 1-2 tablets of tylenol every 6 hours as needed for pain. Please keep in mind that muscle relaxer's can cause fatigue and should not be taken while at work or driving.Do not drink alcohol while taking muscle relaxers. I usually recommend only taking at night to help you get to sleep.    Back pain is very common.  The pain often gets better over time.  The cause of back pain is usually not dangerous.  Most people can learn to manage their back pain on their own.  Home Care  Stay active.  Start with short walks on flat ground if you can.  Try to walk farther each day.  Do not sit, drive or stand in one place for more than 30 minutes.  Do not stay in bed.  Do not avoid exercise or work.  Activity can help your back heal faster.  Be careful when you bend or lift an object.  Bend at your knees, keep the object close to you, and do not twist.  Sleep on a firm mattress.  Lie on your side, and bend your knees.  If you lie on your back, put a pillow under your knees.  Only take medicines as told by your doctor.  Put ice on the injured  area.  Put ice in a plastic bag  Place a towel between your skin and the bag  Leave the ice on for 15-20 minutes, 3-4 times a day for the first 2-3 days. 210 After that, you can switch between ice and heat packs.  Ask your doctor about back exercises or massage.  Avoid feeling anxious or stressed.  Find good ways to deal with stress, such as exercise.  Get Help Right Way If:  Your pain does not go away with rest or medicine.  Your pain does not go away in 1 week.  You have new problems.  You do not feel well.  The pain spreads into your legs.  You cannot control when you poop (bowel movement) or pee (urinate)  You feel sick to your stomach (nauseous) or throw up (vomit)  You have belly (abdominal) pain.  You feel like you may pass out (faint).  If you develop a fever.  Make Sure you:  Understand these instructions.  Will watch your condition  Will get help right away if you are not doing well or get worse.  Your e-visit answers were reviewed by a board certified advanced clinical practitioner to complete your personal care plan.  Depending on the condition, your plan could have included both over the counter or prescription  medications.  If there is a problem please reply  once you have received a response from your provider.  Your safety is important to [place].  If you have drug allergies check your prescription carefully.    You can use MyChart to ask questions about today's visit, request a non-urgent call back, or ask for a work or school excuse for 24 hours related to this e-Visit. If it has been greater than 24 hours you will need to follow up with your provider, or enter a new e-Visit to address those concerns.  You will get an e-mail in the next two days asking about your experience.  I hope that your e-visit has been valuable and will speed your recovery. Thank you for using e-visits.  Greater than 5 minutes, yet less than 10 minutes of time have been spent  researching, coordinating, and implementing care for this patient today.

## 2019-08-14 NOTE — Progress Notes (Deleted)
I have reviewed your medical chart and recent e-visit encounter.  I have completed a work note for you that will be available to you on your MyChart account and "letters".  Hope you feel better!

## 2019-09-26 ENCOUNTER — Emergency Department
Admission: EM | Admit: 2019-09-26 | Discharge: 2019-09-26 | Disposition: A | Discharge status: Home / Self Care | Payer: Self-pay | Attending: Emergency Medicine | Admitting: Emergency Medicine

## 2019-09-26 ENCOUNTER — Other Ambulatory Visit: Payer: Self-pay

## 2019-09-26 DIAGNOSIS — F17200 Nicotine dependence, unspecified, uncomplicated: Secondary | ICD-10-CM | POA: Insufficient documentation

## 2019-09-26 DIAGNOSIS — E119 Type 2 diabetes mellitus without complications: Secondary | ICD-10-CM | POA: Insufficient documentation

## 2019-09-26 DIAGNOSIS — Z20822 Contact with and (suspected) exposure to covid-19: Secondary | ICD-10-CM | POA: Insufficient documentation

## 2019-09-26 DIAGNOSIS — J45909 Unspecified asthma, uncomplicated: Secondary | ICD-10-CM | POA: Insufficient documentation

## 2019-09-26 DIAGNOSIS — B349 Viral infection, unspecified: Secondary | ICD-10-CM | POA: Insufficient documentation

## 2019-09-26 DIAGNOSIS — Z7984 Long term (current) use of oral hypoglycemic drugs: Secondary | ICD-10-CM | POA: Insufficient documentation

## 2019-09-26 MED ORDER — DICYCLOMINE HCL 10 MG PO CAPS: 10.0000 mg | ORAL_CAPSULE | Freq: Four times a day (QID) | ORAL | 0 refills | Status: DC

## 2019-09-26 MED ORDER — ONDANSETRON HCL 4 MG PO TABS: 4.0000 mg | ORAL_TABLET | Freq: Three times a day (TID) | ORAL | 0 refills | Status: DC | PRN

## 2019-09-26 NOTE — ED Provider Notes (Signed)
Emergency Department Provider Note  ____________________________________________  Time seen: Approximately 4:11 PM  I have reviewed the triage vital signs and the nursing notes.   HISTORY  Chief Complaint Dental Pain, Diarrhea, and Sore Throat   Historian Patient     HPI Alexa Dunn is a 34 y.o. female presents to the emergency department with pharyngitis, headache, diarrhea and emesis for the past 2 to 3 days.  Patient reports that her body aches and "even my teeth hurt".  She works at a [employer] and states that she has numerous potential sick contacts.  She denies chest pain, chest tightness or abdominal pain.  She denies fever or chills.  No other alleviating measures have been attempted.   Past Medical History:  Diagnosis Date  . Asthma   . Diabetes mellitus without complication (HCC)      Immunizations up to date:  Yes.     Past Medical History:  Diagnosis Date  . Asthma   . Diabetes mellitus without complication (HCC)     There are no problems to display for this patient.   Past Surgical History:  Procedure Laterality Date  . CESAREAN SECTION    . TONSILLECTOMY      Prior to Admission medications   Medication Sig Start Date End Date Taking? Authorizing Provider  acetaminophen (TYLENOL) 500 MG tablet Take 1 tablet (500 mg total) by mouth every 6 (six) hours as needed. 08/14/19   Fawze, Mina A, PA-C  albuterol (PROVENTIL HFA;VENTOLIN HFA) 108 (90 Base) MCG/ACT inhaler Inhale 1-2 puffs into the lungs every 6 (six) hours as needed for wheezing or shortness of breath. Patient not taking: Reported on 11/29/2018 05/26/16   Hagler, Jami L, PA-C  benzonatate (TESSALON PERLES) 100 MG capsule Take 1 capsule (100 mg total) by mouth 3 (three) times daily as needed. 08/05/19   Daphine Deutscher, Mary-Margaret, FNP  cefdinir (OMNICEF) 300 MG capsule Take 1 capsule (300 mg total) by mouth 2 (two) times daily. Patient not taking: Reported on 11/29/2018 09/08/18   Faythe Ghee, PA-C  cyclobenzaprine (FLEXERIL) 10 MG tablet Take 1 tablet (10 mg total) by mouth 2 (two) times daily as needed for muscle spasms. 08/14/19   Fawze, Mina A, PA-C  dicyclomine (BENTYL) 10 MG capsule Take 1 capsule (10 mg total) by mouth 4 (four) times daily for 14 days. 09/26/19 10/10/19  Orvil Feil, PA-C  HYDROcodone-acetaminophen (NORCO/VICODIN) 5-325 MG tablet Take 1 tablet by mouth every 6 (six) hours as needed for moderate pain. Patient not taking: Reported on 01/18/2019 11/29/18   Arnaldo Natal, MD  ibuprofen (ADVIL) 800 MG tablet Take 1 tablet (800 mg total) by mouth every 8 (eight) hours as needed for moderate pain. 06/17/19   Irean Hong, MD  metFORMIN (GLUCOPHAGE) 1000 MG tablet Take 0.5 tablets (500 mg total) by mouth 2 (two) times daily with a meal. 09/08/18   Fisher, Roselyn Bering, PA-C  naproxen (NAPROSYN) 500 MG tablet Take 1 tablet (500 mg total) by mouth 2 (two) times daily with a meal. 08/14/19   Fawze, Mina A, PA-C  ondansetron (ZOFRAN) 4 MG tablet Take 1 tablet (4 mg total) by mouth every 8 (eight) hours as needed for nausea or vomiting. 09/26/19   Orvil Feil, PA-C  ondansetron (ZOFRAN-ODT) 4 MG disintegrating tablet Take 1 tablet (4 mg total) by mouth every 8 (eight) hours as needed for nausea or vomiting. Patient not taking: Reported on 11/29/2018 09/08/18   Faythe Ghee, PA-C  oxyCODONE-acetaminophen (PERCOCET/ROXICET) 5-325 MG tablet Take 1 tablet by mouth every 4 (four) hours as needed for severe pain. 06/17/19   Irean Hong, MD    Allergies Penicillins  No family history on file.  Social History Social History   Tobacco Use  . Smoking status: Current Every Day Smoker    Packs/day: 1.00  . Smokeless tobacco: Never Used  Substance Use Topics  . Alcohol use: No  . Drug use: No      Review of Systems  Constitutional: Patient has been afebrile.  Eyes: No visual changes. No discharge ENT: Patient has congestion.  Cardiovascular: no chest  pain. Respiratory: Patient has cough.  Gastrointestinal: No abdominal pain. Patient has nausea and vomiting. Patient had diarrhea.  Genitourinary: Negative for dysuria. No hematuria Musculoskeletal: Patient has myalgias.  Skin: Negative for rash, abrasions, lacerations, ecchymosis. Neurological: Patient has headache, no focal weakness or numbness.    ____________________________________________   PHYSICAL EXAM:  VITAL SIGNS: ED Triage Vitals  Enc Vitals Group     BP 09/26/19 1520 (!) 156/80     Pulse Rate 09/26/19 1520 92     Resp 09/26/19 1520 19     Temp 09/26/19 1520 97.7 F (36.5 C)     Temp src --      SpO2 09/26/19 1520 96 %     Weight 09/26/19 1518 190 lb (86.2 kg)     Height 09/26/19 1518 5\' 5"  (1.651 m)     Head Circumference --      Peak Flow --      Pain Score 09/26/19 1518 7     Pain Loc --      Pain Edu? --      Excl. in GC? --      Constitutional: Alert and oriented. Patient is lying supine. Eyes: Conjunctivae are normal. PERRL. EOMI. Head: Atraumatic. ENT:      Ears: Tympanic membranes are mildly injected with mild effusion bilaterally.       Nose: No congestion/rhinnorhea.      Mouth/Throat: Mucous membranes are moist. Posterior pharynx is mildly erythematous.  Hematological/Lymphatic/Immunilogical: No cervical lymphadenopathy.  Cardiovascular: Normal rate, regular rhythm. Normal S1 and S2.  Good peripheral circulation. Respiratory: Normal respiratory effort without tachypnea or retractions. Lungs CTAB. Good air entry to the bases with no decreased or absent breath sounds. Gastrointestinal: Bowel sounds 4 quadrants. Soft and nontender to palpation. No guarding or rigidity. No palpable masses. No distention. No CVA tenderness. Musculoskeletal: Full range of motion to all extremities. No gross deformities appreciated. Neurologic:  Normal speech and language. No gross focal neurologic deficits are appreciated.  Skin:  Skin is warm, dry and intact. No  rash noted. Psychiatric: Mood and affect are normal. Speech and behavior are normal. Patient exhibits appropriate insight and judgement.   ____________________________________________   LABS (all labs ordered are listed, but only abnormal results are displayed)  Labs Reviewed  SARS CORONAVIRUS 2 (TAT 6-24 HRS)   ____________________________________________  EKG   ____________________________________________  RADIOLOGY  No results found.  ____________________________________________    PROCEDURES  Procedure(s) performed:     Procedures     Medications - No data to display   ____________________________________________   INITIAL IMPRESSION / ASSESSMENT AND PLAN / ED COURSE  Pertinent labs & imaging results that were available during my care of the patient were reviewed by me and considered in my medical decision making (see chart for details).      Assessment and plan: Viral Illness  34 year old female  presents to the emergency department with pharyngitis, headache and diarrhea for the past 2 to 3 days as well some generalized dental pain.  Patient was hypertensive at triage but vital signs were otherwise reassuring.  Patient was resting comfortably in exam room with no increased work of breathing.  Sent off COVID-19 testing is pending at this time.  Rest and hydration were encouraged.  Patient was discharged with Bentyl and Zofran for nausea and diarrhea.  She reports that she has an albuterol inhaler at home.  A work note was provided.  Return precautions were given to return with new or worsening symptoms.  All patient questions were answered.   ____________________________________________  FINAL CLINICAL IMPRESSION(S) / ED DIAGNOSES  Final diagnoses:  Viral illness      NEW MEDICATIONS STARTED DURING THIS VISIT:  ED Discharge Orders         Ordered    dicyclomine (BENTYL) 10 MG capsule  4 times daily     09/26/19 1606    ondansetron  (ZOFRAN) 4 MG tablet  Every 8 hours PRN     09/26/19 1606              This chart was dictated using voice recognition software/Dragon. Despite best efforts to proofread, errors can occur which can change the meaning. Any change was purely unintentional.     Lannie Fields, PA-C 09/26/19 1615    Vanessa [place], MD 09/26/19 [phone]

## 2019-09-26 NOTE — ED Triage Notes (Signed)
Pt comes via POV from home with c/o sore throat, diarrhea for two days and woke up this am with mouth pain.  Pt states she does have bad teeth. Pt states no relief with medication.

## 2019-09-27 LAB — SARS CORONAVIRUS 2 (TAT 6-24 HRS): SARS Coronavirus 2: NEGATIVE

## 2020-01-18 ENCOUNTER — Emergency Department
Admission: EM | Admit: 2020-01-18 | Discharge: 2020-01-18 | Disposition: A | Discharge status: Home / Self Care | Payer: Medicaid Other | Attending: Emergency Medicine | Admitting: Emergency Medicine

## 2020-01-18 ENCOUNTER — Other Ambulatory Visit: Payer: Self-pay

## 2020-01-18 ENCOUNTER — Encounter: Payer: Self-pay | Admitting: Emergency Medicine

## 2020-01-18 DIAGNOSIS — J45909 Unspecified asthma, uncomplicated: Secondary | ICD-10-CM | POA: Insufficient documentation

## 2020-01-18 DIAGNOSIS — Z79899 Other long term (current) drug therapy: Secondary | ICD-10-CM | POA: Diagnosis not present

## 2020-01-18 DIAGNOSIS — Z7984 Long term (current) use of oral hypoglycemic drugs: Secondary | ICD-10-CM | POA: Insufficient documentation

## 2020-01-18 DIAGNOSIS — J029 Acute pharyngitis, unspecified: Secondary | ICD-10-CM

## 2020-01-18 DIAGNOSIS — R519 Headache, unspecified: Secondary | ICD-10-CM | POA: Diagnosis present

## 2020-01-18 DIAGNOSIS — F17200 Nicotine dependence, unspecified, uncomplicated: Secondary | ICD-10-CM | POA: Diagnosis not present

## 2020-01-18 DIAGNOSIS — E119 Type 2 diabetes mellitus without complications: Secondary | ICD-10-CM | POA: Insufficient documentation

## 2020-01-18 LAB — GROUP A STREP BY PCR: Group A Strep by PCR: NOT DETECTED

## 2020-01-18 MED ORDER — AZITHROMYCIN 500 MG PO TABS
500.0000 mg | ORAL_TABLET | Freq: Once | ORAL | Status: AC
  Administered 2020-01-18: 500 mg via ORAL
  Filled 2020-01-18: qty 1

## 2020-01-18 MED ORDER — AZITHROMYCIN 250 MG PO TABS: 250.0000 mg | ORAL_TABLET | Freq: Every day | ORAL | 0 refills | Status: AC

## 2020-01-18 MED ORDER — LIDOCAINE VISCOUS HCL 2 % MT SOLN
15.0000 mL | Freq: Once | OROMUCOSAL | Status: AC
  Administered 2020-01-18: 16:00:00 15 mL via OROMUCOSAL
  Filled 2020-01-18: qty 15

## 2020-01-18 MED ORDER — ACETAMINOPHEN 325 MG PO TABS
650.0000 mg | ORAL_TABLET | Freq: Once | ORAL | Status: AC
  Administered 2020-01-18: 650 mg via ORAL
  Filled 2020-01-18: qty 2

## 2020-01-18 NOTE — ED Triage Notes (Signed)
Pt to ER with c/o headache, sore throat and fatigue for last 3-4 days.  Pt also reports cough.

## 2020-01-18 NOTE — Discharge Instructions (Signed)
You are being treated for strep pharyngitis. Take the antibiotic as directed. Change your toothbrush after 24-hours on the antibiotic. Follow-up with your provider for continued symptoms.

## 2020-01-18 NOTE — ED Provider Notes (Signed)
[facility] Emergency Department Provider Note ____________________________________________  Time seen: 1459  I have reviewed the triage vital signs and the nursing notes.  HISTORY  Chief Complaint  Headache and Sore Throat  HPI Alexa Dunn is a 34 y.o. female presents herself to the ED for evaluation of several days complaint of headache and sore throat.  Patient reports subjective fevers and sore throat.  Patient is status post a tonsillectomy, but has had strep pharyngitis in the recent past.   She also reports generalized malaise and fatigue.  She has been taking Tylenol intermittently for symptom relief.  She denies any nausea, vomiting, diarrhea.  She also denies any chest pain or shortness of breath.  She is decreased solid food intake but is unable to drink liquids without emesis.  She had a rapid Covid test yesterday which was negative.  Past Medical History:  Diagnosis Date  . Asthma   . Diabetes mellitus without complication (HCC)     There are no problems to display for this patient.   Past Surgical History:  Procedure Laterality Date  . CESAREAN SECTION    . TONSILLECTOMY      Prior to Admission medications   Medication Sig Start Date End Date Taking? Authorizing Provider  acetaminophen (TYLENOL) 500 MG tablet Take 1 tablet (500 mg total) by mouth every 6 (six) hours as needed. 08/14/19   Fawze, Mina A, PA-C  albuterol (PROVENTIL HFA;VENTOLIN HFA) 108 (90 Base) MCG/ACT inhaler Inhale 1-2 puffs into the lungs every 6 (six) hours as needed for wheezing or shortness of breath. Patient not taking: Reported on 11/29/2018 05/26/16   Hagler, Jami L, PA-C  azithromycin (ZITHROMAX Z-PAK) 250 MG tablet Take 1 tablet (250 mg total) by mouth daily for 4 days. 01/19/20 01/23/20  Derrick Tiegs, Charlesetta Ivory, PA-C  benzonatate (TESSALON PERLES) 100 MG capsule Take 1 capsule (100 mg total) by mouth 3 (three) times daily as needed. 08/05/19   Daphine Deutscher, Mary-Margaret, FNP   cefdinir (OMNICEF) 300 MG capsule Take 1 capsule (300 mg total) by mouth 2 (two) times daily. Patient not taking: Reported on 11/29/2018 09/08/18   Faythe Ghee, PA-C  cyclobenzaprine (FLEXERIL) 10 MG tablet Take 1 tablet (10 mg total) by mouth 2 (two) times daily as needed for muscle spasms. 08/14/19   Fawze, Mina A, PA-C  dicyclomine (BENTYL) 10 MG capsule Take 1 capsule (10 mg total) by mouth 4 (four) times daily for 14 days. 09/26/19 10/10/19  Orvil Feil, PA-C  HYDROcodone-acetaminophen (NORCO/VICODIN) 5-325 MG tablet Take 1 tablet by mouth every 6 (six) hours as needed for moderate pain. Patient not taking: Reported on 01/18/2019 11/29/18   Arnaldo Natal, MD  ibuprofen (ADVIL) 800 MG tablet Take 1 tablet (800 mg total) by mouth every 8 (eight) hours as needed for moderate pain. 06/17/19   Irean Hong, MD  metFORMIN (GLUCOPHAGE) 1000 MG tablet Take 0.5 tablets (500 mg total) by mouth 2 (two) times daily with a meal. 09/08/18   Fisher, Roselyn Bering, PA-C  naproxen (NAPROSYN) 500 MG tablet Take 1 tablet (500 mg total) by mouth 2 (two) times daily with a meal. 08/14/19   Fawze, Mina A, PA-C  ondansetron (ZOFRAN) 4 MG tablet Take 1 tablet (4 mg total) by mouth every 8 (eight) hours as needed for nausea or vomiting. 09/26/19   Orvil Feil, PA-C  ondansetron (ZOFRAN-ODT) 4 MG disintegrating tablet Take 1 tablet (4 mg total) by mouth every 8 (eight) hours as needed  for nausea or vomiting. Patient not taking: Reported on 11/29/2018 09/08/18   Faythe Ghee, PA-C  oxyCODONE-acetaminophen (PERCOCET/ROXICET) 5-325 MG tablet Take 1 tablet by mouth every 4 (four) hours as needed for severe pain. 06/17/19   Irean Hong, MD    Allergies Penicillins  History reviewed. No pertinent family history.  Social History Social History   Tobacco Use  . Smoking status: Current Every Day Smoker    Packs/day: 1.00  . Smokeless tobacco: Never Used  Substance Use Topics  . Alcohol use: No  . Drug use: No     Review of Systems  Constitutional: Positive for fever. Eyes: Negative for visual changes. ENT: Positive for sore throat. Cardiovascular: Negative for chest pain. Respiratory: Negative for shortness of breath. Gastrointestinal: Negative for abdominal pain, vomiting and diarrhea. Genitourinary: Negative for dysuria. Musculoskeletal: Negative for back pain. Skin: Negative for rash. Neurological: Negative for headaches, focal weakness or numbness. ____________________________________________  PHYSICAL EXAM:  VITAL SIGNS: ED Triage Vitals  Enc Vitals Group     BP 01/18/20 1328 139/80     Pulse Rate 01/18/20 1328 84     Resp 01/18/20 1328 18     Temp 01/18/20 1328 (!) 97.5 F (36.4 C)     Temp Source 01/18/20 1328 Oral     SpO2 01/18/20 1328 97 %     Weight 01/18/20 1329 200 lb (90.7 kg)     Height 01/18/20 1329 5\' 5"  (1.651 m)     Head Circumference --      Peak Flow --      Pain Score 01/18/20 1329 4     Pain Loc --      Pain Edu? --      Excl. in GC? --     Constitutional: Alert and oriented. Well appearing and in no distress. Head: Normocephalic and atraumatic. Eyes: Conjunctivae are normal. PERRL. Normal extraocular movements Ears: Canals clear. TMs intact bilaterally. Nose: No congestion/rhinorrhea/epistaxis. Mouth/Throat: Mucous membranes are moist.  Uvula is midline tonsils are absent, and mild oropharyngeal erythema is appreciated.  No brawny sublingual edema is noted. Neck: Supple. No thyromegaly. Hematological/Lymphatic/Immunological: Palpable anterior cervical lymphadenopathy. Cardiovascular: Normal rate, regular rhythm. Normal distal pulses. Respiratory: Normal respiratory effort. No wheezes/rales/rhonchi. Gastrointestinal: Soft and nontender. No distention. Musculoskeletal: Nontender with normal range of motion in all extremities.  ____________________________________________   LABS (pertinent positives/negatives)  Labs Reviewed  GROUP A STREP BY PCR   ____________________________________________  PROCEDURES  Tylenol 650 mg PO Azithromycin 500 mg PO3  Procedures ____________________________________________  INITIAL IMPRESSION / ASSESSMENT AND PLAN / ED COURSE  Patient with ED evaluation of sore throat. Her rapid strep is negative, but clinically she is concerning for pharyngitis. She will be treated with azithromycin for symptom relief. Return precautions have been reviewed.   Alexa Dunn was evaluated in Emergency Department on 01/18/2020 for the symptoms described in the history of present illness. She was evaluated in the context of the global COVID-19 pandemic, which necessitated consideration that the patient might be at risk for infection with the SARS-CoV-2 virus that causes COVID-19. Institutional protocols and algorithms that pertain to the evaluation of patients at risk for COVID-19 are in a state of rapid change based on information released by regulatory bodies including the CDC and federal and state organizations. These policies and algorithms were followed during the patient's care in the ED. .____________________________________________  FINAL CLINICAL IMPRESSION(S) / ED DIAGNOSES  Final diagnoses:  Sore throat      Suhail Peloquin V  Berniece Salines, PA-C 01/18/20 1626    Delman Kitten, MD 01/19/20 0100

## 2020-01-18 NOTE — ED Notes (Signed)
Pt states that she has been feeling bad for the last few days with body aches, fevers, congestion, a sore throat and neck, and vomiting that started this morning. Pt denies cp and shob. Pt states she had a rapid covid test that came back neg yesterday.

## 2020-01-25 ENCOUNTER — Inpatient Hospital Stay
Admission: EM | Admit: 2020-01-25 | Discharge: 2020-01-28 | DRG: 872 | Disposition: A | Discharge status: Home / Self Care | Payer: Medicaid Other | Attending: Internal Medicine | Admitting: Internal Medicine

## 2020-01-25 ENCOUNTER — Emergency Department: Payer: Medicaid Other

## 2020-01-25 ENCOUNTER — Other Ambulatory Visit: Payer: Self-pay

## 2020-01-25 ENCOUNTER — Encounter: Payer: Self-pay | Admitting: Emergency Medicine

## 2020-01-25 DIAGNOSIS — A419 Sepsis, unspecified organism: Secondary | ICD-10-CM | POA: Diagnosis present

## 2020-01-25 DIAGNOSIS — B9620 Unspecified Escherichia coli [E. coli] as the cause of diseases classified elsewhere: Secondary | ICD-10-CM | POA: Diagnosis present

## 2020-01-25 DIAGNOSIS — N10 Acute tubulo-interstitial nephritis: Secondary | ICD-10-CM | POA: Diagnosis present

## 2020-01-25 DIAGNOSIS — J4520 Mild intermittent asthma, uncomplicated: Secondary | ICD-10-CM | POA: Diagnosis present

## 2020-01-25 DIAGNOSIS — E1165 Type 2 diabetes mellitus with hyperglycemia: Secondary | ICD-10-CM | POA: Diagnosis present

## 2020-01-25 DIAGNOSIS — J45909 Unspecified asthma, uncomplicated: Secondary | ICD-10-CM | POA: Diagnosis present

## 2020-01-25 DIAGNOSIS — Z6833 Body mass index (BMI) 33.0-33.9, adult: Secondary | ICD-10-CM

## 2020-01-25 DIAGNOSIS — Z7984 Long term (current) use of oral hypoglycemic drugs: Secondary | ICD-10-CM

## 2020-01-25 DIAGNOSIS — R319 Hematuria, unspecified: Secondary | ICD-10-CM | POA: Diagnosis present

## 2020-01-25 DIAGNOSIS — F17210 Nicotine dependence, cigarettes, uncomplicated: Secondary | ICD-10-CM | POA: Diagnosis present

## 2020-01-25 DIAGNOSIS — E119 Type 2 diabetes mellitus without complications: Secondary | ICD-10-CM

## 2020-01-25 DIAGNOSIS — M62838 Other muscle spasm: Secondary | ICD-10-CM | POA: Diagnosis present

## 2020-01-25 DIAGNOSIS — Z79899 Other long term (current) drug therapy: Secondary | ICD-10-CM | POA: Diagnosis not present

## 2020-01-25 DIAGNOSIS — N12 Tubulo-interstitial nephritis, not specified as acute or chronic: Secondary | ICD-10-CM

## 2020-01-25 DIAGNOSIS — E669 Obesity, unspecified: Secondary | ICD-10-CM | POA: Diagnosis present

## 2020-01-25 DIAGNOSIS — R7881 Bacteremia: Secondary | ICD-10-CM | POA: Diagnosis present

## 2020-01-25 DIAGNOSIS — A4151 Sepsis due to Escherichia coli [E. coli]: Principal | ICD-10-CM | POA: Diagnosis present

## 2020-01-25 DIAGNOSIS — Z20822 Contact with and (suspected) exposure to covid-19: Secondary | ICD-10-CM | POA: Diagnosis present

## 2020-01-25 DIAGNOSIS — R109 Unspecified abdominal pain: Secondary | ICD-10-CM | POA: Diagnosis present

## 2020-01-25 DIAGNOSIS — Z880 Allergy status to penicillin: Secondary | ICD-10-CM | POA: Diagnosis not present

## 2020-01-25 LAB — CBC WITH DIFFERENTIAL/PLATELET
Abs Immature Granulocytes: 0.08 10*3/uL — ABNORMAL HIGH (ref 0.00–0.07)
Basophils Absolute: 0.1 10*3/uL (ref 0.0–0.1)
Basophils Relative: 0 %
Eosinophils Absolute: 0.1 10*3/uL (ref 0.0–0.5)
Eosinophils Relative: 1 %
HCT: 41.8 % (ref 36.0–46.0)
Hemoglobin: 13.8 g/dL (ref 12.0–15.0)
Immature Granulocytes: 1 %
Lymphocytes Relative: 13 %
Lymphs Abs: 1.7 10*3/uL (ref 0.7–4.0)
MCH: 27.8 pg (ref 26.0–34.0)
MCHC: 33 g/dL (ref 30.0–36.0)
MCV: 84.1 fL (ref 80.0–100.0)
Monocytes Absolute: 0.7 10*3/uL (ref 0.1–1.0)
Monocytes Relative: 5 %
Neutro Abs: 10.1 10*3/uL — ABNORMAL HIGH (ref 1.7–7.7)
Neutrophils Relative %: 80 %
Platelets: 297 10*3/uL (ref 150–400)
RBC: 4.97 MIL/uL (ref 3.87–5.11)
RDW: 12.4 % (ref 11.5–15.5)
WBC: 12.7 10*3/uL — ABNORMAL HIGH (ref 4.0–10.5)
nRBC: 0 % (ref 0.0–0.2)

## 2020-01-25 LAB — URINALYSIS, COMPLETE (UACMP) WITH MICROSCOPIC
Bilirubin Urine: NEGATIVE
Glucose, UA: >=500 mg/dL — AB
Ketones, ur: NEGATIVE mg/dL
Nitrite: NEGATIVE
Protein, ur: 100 mg/dL — AB
Specific Gravity, Urine: 1.027 (ref 1.005–1.030)
WBC, UA: >50 WBC/hpf — ABNORMAL HIGH (ref 0–5)
pH: 6 (ref 5.0–8.0)

## 2020-01-25 LAB — COMPREHENSIVE METABOLIC PANEL
ALT: 18 U/L (ref 0–44)
AST: 19 U/L (ref 15–41)
Albumin: 3.8 g/dL (ref 3.5–5.0)
Alkaline Phosphatase: 58 U/L (ref 38–126)
Anion gap: 12 (ref 5–15)
BUN: 11 mg/dL (ref 6–20)
CO2: 24 mmol/L (ref 22–32)
Calcium: 9.6 mg/dL (ref 8.9–10.3)
Chloride: 97 mmol/L — ABNORMAL LOW (ref 98–111)
Creatinine, Ser: 0.65 mg/dL (ref 0.44–1.00)
GFR calc Af Amer: >60 mL/min (ref >60)
GFR calc non Af Amer: >60 mL/min (ref >60)
Glucose, Bld: 371 mg/dL — ABNORMAL HIGH (ref 70–99)
Potassium: 4.2 mmol/L (ref 3.5–5.1)
Sodium: 133 mmol/L — ABNORMAL LOW (ref 135–145)
Total Bilirubin: 1 mg/dL (ref 0.3–1.2)
Total Protein: 7.7 g/dL (ref 6.5–8.1)

## 2020-01-25 LAB — LACTIC ACID, PLASMA
Lactic Acid, Venous: 2.2 mmol/L (ref 0.5–1.9)
Lactic Acid, Venous: 1.7 mmol/L (ref 0.5–1.9)

## 2020-01-25 LAB — GLUCOSE, CAPILLARY: Glucose-Capillary: 298 mg/dL — ABNORMAL HIGH (ref 70–99)

## 2020-01-25 LAB — HIV ANTIBODY (ROUTINE TESTING W REFLEX): HIV Screen 4th Generation wRfx: NONREACTIVE

## 2020-01-25 LAB — HEMOGLOBIN A1C
Hgb A1c MFr Bld: 9.7 % — ABNORMAL HIGH (ref 4.8–5.6)
Mean Plasma Glucose: 231.69 mg/dL

## 2020-01-25 LAB — SARS CORONAVIRUS 2 BY RT PCR (HOSPITAL ORDER, PERFORMED IN ~~LOC~~ HOSPITAL LAB): SARS Coronavirus 2: NEGATIVE

## 2020-01-25 LAB — PREGNANCY, URINE: Preg Test, Ur: NEGATIVE

## 2020-01-25 MED ORDER — KETOROLAC TROMETHAMINE 30 MG/ML IJ SOLN
15.0000 mg | Freq: Once | INTRAMUSCULAR | Status: AC
  Administered 2020-01-25: 15 mg via INTRAVENOUS
  Filled 2020-01-25: qty 1

## 2020-01-25 MED ORDER — ONDANSETRON HCL 4 MG/2ML IJ SOLN
4.0000 mg | Freq: Once | INTRAMUSCULAR | Status: AC
  Administered 2020-01-25: 4 mg via INTRAVENOUS
  Filled 2020-01-25: qty 2

## 2020-01-25 MED ORDER — MORPHINE SULFATE (PF) 4 MG/ML IV SOLN
4.0000 mg | INTRAVENOUS | Status: DC | PRN
  Administered 2020-01-25 – 2020-01-26 (×7): 4 mg via INTRAVENOUS
  Filled 2020-01-25 (×7): qty 1

## 2020-01-25 MED ORDER — SODIUM CHLORIDE 0.9 % IV SOLN
1.0000 g | Freq: Once | INTRAVENOUS | Status: AC
  Administered 2020-01-25: 1 g via INTRAVENOUS
  Filled 2020-01-25: qty 10

## 2020-01-25 MED ORDER — INSULIN ASPART 100 UNIT/ML ~~LOC~~ SOLN
0.0000 [IU] | Freq: Three times a day (TID) | SUBCUTANEOUS | Status: DC
  Administered 2020-01-25: 8 [IU] via SUBCUTANEOUS
  Administered 2020-01-26: 3 [IU] via SUBCUTANEOUS
  Administered 2020-01-26: 5 [IU] via SUBCUTANEOUS
  Administered 2020-01-26: 8 [IU] via SUBCUTANEOUS
  Administered 2020-01-27 (×2): 5 [IU] via SUBCUTANEOUS
  Administered 2020-01-27: 3 [IU] via SUBCUTANEOUS
  Administered 2020-01-28: 5 [IU] via SUBCUTANEOUS
  Filled 2020-01-25 (×8): qty 1

## 2020-01-25 MED ORDER — LACTATED RINGERS IV BOLUS
1000.0000 mL | Freq: Once | INTRAVENOUS | Status: AC
  Administered 2020-01-25: 1000 mL via INTRAVENOUS

## 2020-01-25 MED ORDER — PROMETHAZINE HCL 25 MG/ML IJ SOLN: 12.5000 mg | Freq: Four times a day (QID) | INTRAMUSCULAR | Status: DC | PRN

## 2020-01-25 MED ORDER — ACETAMINOPHEN 500 MG PO TABS
500.0000 mg | ORAL_TABLET | Freq: Three times a day (TID) | ORAL | Status: DC | PRN
  Administered 2020-01-25 – 2020-01-27 (×4): 500 mg via ORAL
  Filled 2020-01-25 (×5): qty 1

## 2020-01-25 MED ORDER — SODIUM CHLORIDE 0.9 % IV SOLN
1.0000 g | INTRAVENOUS | Status: DC
  Filled 2020-01-25: qty 10

## 2020-01-25 MED ORDER — ACETAMINOPHEN 500 MG PO TABS
1000.0000 mg | ORAL_TABLET | Freq: Once | ORAL | Status: AC
  Administered 2020-01-25: 1000 mg via ORAL
  Filled 2020-01-25: qty 2

## 2020-01-25 MED ORDER — ENOXAPARIN SODIUM 40 MG/0.4ML ~~LOC~~ SOLN
40.0000 mg | SUBCUTANEOUS | Status: DC
  Administered 2020-01-25 – 2020-01-27 (×3): 40 mg via SUBCUTANEOUS
  Filled 2020-01-25 (×3): qty 0.4

## 2020-01-25 MED ORDER — ONDANSETRON HCL 4 MG PO TABS
4.0000 mg | ORAL_TABLET | Freq: Four times a day (QID) | ORAL | Status: DC | PRN
  Administered 2020-01-26: 4 mg via ORAL
  Filled 2020-01-25: qty 1

## 2020-01-25 MED ORDER — ONDANSETRON HCL 4 MG/2ML IJ SOLN
4.0000 mg | Freq: Four times a day (QID) | INTRAMUSCULAR | Status: DC | PRN
  Administered 2020-01-25 – 2020-01-26 (×2): 4 mg via INTRAVENOUS
  Filled 2020-01-25 (×2): qty 2

## 2020-01-25 MED ORDER — KETOROLAC TROMETHAMINE 30 MG/ML IJ SOLN
15.0000 mg | Freq: Four times a day (QID) | INTRAMUSCULAR | Status: DC | PRN
  Administered 2020-01-26: 15 mg via INTRAVENOUS
  Filled 2020-01-25 (×2): qty 1

## 2020-01-25 MED ORDER — SODIUM CHLORIDE 0.9 % IV SOLN: INTRAVENOUS | Status: DC

## 2020-01-25 NOTE — H&P (Signed)
History and Physical    Alexa Dunn HWE:[id number] DOB: 1986-03-02 DOA: 01/25/2020  PCP: Patient, No Pcp Per   Patient coming from: Home  I have personally briefly reviewed patient's old medical records in [facility]  Chief Complaint: Left flank pain                               Hematuria  HPI: Alexa Dunn is a 34 y.o. female with medical history significant for asthma and diabetes mellitus who presents to the emergency room for evaluation of a 3-day history of left arm pain associated with frequency of urination, dysuria and hematuria.  She also had associated nausea, vomiting and poor oral intake.  She had taken cranberry juice for her symptoms without any significant improvement and decided to present to the emergency room due to the persistence of her symptoms. Upon arrival to the ER she was found to have a fever with a T-max of 100.76F, she was tachycardic and tachypneic. Labs revealed a white cell count of 12.7, pyuria and lactate level 2.2. She denies having any chest pain, shortness of breath, dizziness, cough, lightheadedness, changes in her bowel habits She had a CT scan of abdomen and pelvis without contrast which showed mild left-sided ureterectasis and asymmetric left-sided perinephric stranding without associated pelvicaliectasis, the etiology of which is not depicted on this examination. Specifically, no evidence of nephrolithiasis.   ED Course: 33 year old female with a history of diabetes mellitus and asthma who presents to the emergency room for evaluation of dysuria, frequency, hematuria and left flank pain.  She had a fever with a T-max of 100.76F, she was tachycardic and tachypneic and had elevated lactic acid level.  She received a dose of IV Rocephin will be admitted to the hospital for further evaluation.  Review of Systems: As per HPI otherwise 10 point review of systems negative.    Past Medical History:  Diagnosis Date  . Asthma   . Diabetes mellitus  without complication [facility])     Past Surgical History:  Procedure Laterality Date  . CESAREAN SECTION    . TONSILLECTOMY       reports that she has been smoking. She has been smoking about 1.00 pack per day. She has never used smokeless tobacco. She reports that she does not drink alcohol or use drugs.  Allergies  Allergen Reactions  . Penicillins Hives    Other reaction(s): UNKNOWN    No family history on file.   Prior to Admission medications   Medication Sig Start Date End Date Taking? Authorizing Provider  acetaminophen (TYLENOL) 650 MG CR tablet Take 650 mg by mouth every 8 (eight) hours as needed for pain.   Yes [provider]  insulin NPH-regular Human (70-30) 100 UNIT/ML injection Inject 18 Units into the skin in the morning and at bedtime.   Yes [provider]    Physical Exam: Vitals:   01/25/20 1321 01/25/20 1404 01/25/20 1430 01/25/20 1530  BP: (!) 166/72 132/64 131/68 128/63  Pulse: (!) 113 (!) 113 (!) 107 (!) 102  Resp: (!) 22 (!) 23 (!) 22 20  Temp: (!) 100.8 F (38.2 C)     TempSrc: Oral     SpO2: 94% 93% 93% 93%  Weight:      Height:         Vitals:   01/25/20 1321 01/25/20 1404 01/25/20 1430 01/25/20 1530  BP: (!) 166/72 132/64 131/68  128/63  Pulse: (!) 113 (!) 113 (!) 107 (!) 102  Resp: (!) 22 (!) 23 (!) 22 20  Temp: (!) 100.8 F (38.2 C)     TempSrc: Oral     SpO2: 94% 93% 93% 93%  Weight:      Height:        Constitutional: NAD, alert and oriented x 3.  Acutely ill-appearing Eyes: PERRL, lids and conjunctivae pallor ENMT: Mucous membranes are dry.  Neck: normal, supple, no masses, no thyromegaly Respiratory: clear to auscultation bilaterally, no wheezing, no crackles. Normal respiratory effort. No accessory muscle use.  Cardiovascular: Tachycardic, no murmurs / rubs / gallops. No extremity edema. 2+ pedal pulses. No carotid bruits.  Abdomen: Left CVA tenderness, no masses palpated. No hepatosplenomegaly. Bowel sounds  positive.  Musculoskeletal: no clubbing / cyanosis. No joint deformity upper and lower extremities.  Skin: no rashes, lesions, ulcers.  Neurologic: No gross focal neurologic deficit. Psychiatric: Normal mood and affect.   Labs on Admission: I have personally reviewed following labs and imaging studies  CBC: Recent Labs  Lab 01/25/20 0949  WBC 12.7*  NEUTROABS 10.1*  HGB 13.8  HCT 41.8  MCV 84.1  PLT 009   Basic Metabolic Panel: Recent Labs  Lab 01/25/20 0949  NA 133*  K 4.2  CL 97*  CO2 24  GLUCOSE 371*  BUN 11  CREATININE 0.65  CALCIUM 9.6   GFR: Estimated Creatinine Clearance: 113.4 mL/min (by C-G formula based on SCr of 0.65 mg/dL). Liver Function Tests: Recent Labs  Lab 01/25/20 0949  AST 19  ALT 18  ALKPHOS 58  BILITOT 1.0  PROT 7.7  ALBUMIN 3.8   No results for input(s): LIPASE, AMYLASE in the last 168 hours. No results for input(s): AMMONIA in the last 168 hours. Coagulation Profile: No results for input(s): INR, PROTIME in the last 168 hours. Cardiac Enzymes: No results for input(s): CKTOTAL, CKMB, CKMBINDEX, TROPONINI in the last 168 hours. BNP (last 3 results) No results for input(s): PROBNP in the last 8760 hours. HbA1C: No results for input(s): HGBA1C in the last 72 hours. CBG: No results for input(s): GLUCAP in the last 168 hours. Lipid Profile: No results for input(s): CHOL, HDL, LDLCALC, TRIG, CHOLHDL, LDLDIRECT in the last 72 hours. Thyroid Function Tests: No results for input(s): TSH, T4TOTAL, FREET4, T3FREE, THYROIDAB in the last 72 hours. Anemia Panel: No results for input(s): VITAMINB12, FOLATE, FERRITIN, TIBC, IRON, RETICCTPCT in the last 72 hours. Urine analysis:    Component Value Date/Time   COLORURINE YELLOW (A) 01/25/2020 1103   APPEARANCEUR CLOUDY (A) 01/25/2020 1103   APPEARANCEUR Clear 07/04/2014 1600   LABSPEC 1.027 01/25/2020 1103   LABSPEC 1.030 07/04/2014 1600   PHURINE 6.0 01/25/2020 1103   GLUCOSEU >=500 (A)  01/25/2020 1103   GLUCOSEU >=500 07/04/2014 1600   HGBUR MODERATE (A) 01/25/2020 1103   BILIRUBINUR NEGATIVE 01/25/2020 1103   BILIRUBINUR Negative 07/04/2014 1600   KETONESUR NEGATIVE 01/25/2020 1103   PROTEINUR 100 (A) 01/25/2020 1103   NITRITE NEGATIVE 01/25/2020 1103   LEUKOCYTESUR LARGE (A) 01/25/2020 1103   LEUKOCYTESUR 1+ 07/04/2014 1600    Radiological Exams on Admission: CT Renal Stone Study  Result Date: 01/25/2020 CLINICAL DATA:  Flank pain.  Suspected nephrolithiasis. EXAM: CT ABDOMEN AND PELVIS WITHOUT CONTRAST TECHNIQUE: Multidetector CT imaging of the abdomen and pelvis was performed following the standard protocol without IV contrast. COMPARISON:  CT abdomen pelvis-11/29/2018; 08/27/2015 FINDINGS: The lack of intravenous contrast limits the ability to evaluate solid abdominal  organs. Lower chest: Limited visualization of the lower thorax is negative for focal airspace opacity or pleural effusion. Normal heart size.  No pericardial effusion. Hepatobiliary: Hepatomegaly with mild nodularity hepatic contour and partial recanalization of the periumbilical vein. Normal noncontrast appearance of the gallbladder given degree distention. No radiopaque gallstones. No ascites. Pancreas: Normal noncontrast appearance of the pancreas. Spleen: Borderline splenomegaly with the spleen measuring 12.1 cm in diameter (coronal image 89, series 5). Adrenals/Urinary Tract: Mild asymmetric left-sided ureterectasis and slightly asymmetric left-sided perinephric stranding without associated pelvicaliectasis, the etiology of which is not depicted on this examination. Specifically, no evidence of nephrolithiasis. No renal stones are seen along expected course of either ureter or the urinary bladder. Normal noncontrast appearance the urinary bladder given degree distention. Normal noncontrast appearance of the bilateral adrenal glands. Stomach/Bowel: Moderate colonic stool burden without evidence of enteric  obstruction. Normal noncontrast appearance of the terminal ileum and the retrocecal appendix. No pneumoperitoneum, pneumatosis or portal venous gas. Vascular/Lymphatic: Normal caliber of the abdominal aorta. Scattered retroperitoneal lymph nodes are numerous though individually not enlarged by size criteria with index left-sided periaortic lymph node measuring 0.7 cm in greatest short axis diameter (image 37, series 2), presumably reactive in etiology. No definitive bulky retroperitoneal, mesenteric, pelvic or inguinal lymphadenopathy on this noncontrast examination. Reproductive: Dystrophic calcifications within the left adnexa. Otherwise, normal noncontrast appearance of the pelvic organs for age. No discrete adnexal lesions on this noncontrast examination. No free fluid in the pelvic cul-de-sac. Other: Tiny mesenteric fat containing Peri umbilical hernia. Musculoskeletal: No acute or aggressive osseous abnormalities. 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 IMPRESSION: 1. Mild left-sided ureterectasis and asymmetric left-sided perinephric stranding without associated pelvicaliectasis, the etiology of which is not depicted on this examination. Specifically, no evidence of nephrolithiasis. Correlation with urinalysis is advised. 2. Hepatomegaly with nodularity of the hepatic contour, borderline splenomegaly and recanalization of the periumbilical vein, constellation of findings worrisome for early cirrhotic change. Correlation with LFTs is advised. Electronically Signed   By: Simonne Come M.D.   On: 01/25/2020 13:08    EKG: Independently reviewed.   Assessment/Plan Principal Problem:   Sepsis (HCC) Active Problems:   Acute pyelonephritis   Diabetes mellitus without complication (HCC)   Asthma    Sepsis from acute pyelonephritis (POA) As evidenced by fever with a T-max of 100.69F, tachycardia and tachypnea Patient with significant pyuria and left  flank tenderness Place patient empirically on antibiotic therapy with Rocephin 1 g IV daily Aggressive IV fluid resuscitation Follow-up results of blood and urine culture   Diabetes mellitus Place patient on clear liquid diet and advance as tolerated Glycemic control with sliding scale coverage   Asthma Continue as needed bronchodilator therapy    DVT prophylaxis: Lovenox  Code Status: Full Family Communication:: Greater than 50% of time was spent discussing patient's condition and plan of care with her at the bedside.  All questions and concerns have been addressed. Disposition Plan: Back to previous home environment Consults called: None    Ahnya Akre MD [facility]     01/25/2020, 3:54 PM

## 2020-01-25 NOTE — ED Triage Notes (Addendum)
Pt to ED via POV c/o left flank pain and hematuria x 2 days but getting worse. Pt appears uncomfortable at this time, otherwise in NAD.   Pt denies hx/o kidney stones.

## 2020-01-25 NOTE — ED Notes (Addendum)
Pt given cup of diet gingerale. Meal tray ordered from dietary

## 2020-01-25 NOTE — ED Notes (Signed)
Pt had just finished vomiting when this nurse entered room. States that her pain was 6/10 and caused her to throw up. See completed orders.

## 2020-01-25 NOTE — ED Provider Notes (Signed)
[facility] Emergency Department Provider Note    First MD Initiated Contact with Patient 01/25/20 0930     (approximate)  I have reviewed the triage vital signs and the nursing notes.   HISTORY  Chief Complaint Flank Pain and Hematuria    HPI Alexa Dunn is a 34 y.o. female the below listed past medical history presents to the ER for evaluation of left flank pain as well as hematuria and urinary frequency past 2 3 days.  Was initially waxing and waning but now it is constant moderate to severe pain.  Is never had pain like this before.  Denies any measured temperatures or chills.  Is not been on any recent antibiotics.    Past Medical History:  Diagnosis Date  . Asthma   . Diabetes mellitus without complication (HCC)    No family history on file. Past Surgical History:  Procedure Laterality Date  . CESAREAN SECTION    . TONSILLECTOMY     There are no problems to display for this patient.     Prior to Admission medications   Medication Sig Start Date End Date Taking? Authorizing Provider  acetaminophen (TYLENOL) 500 MG tablet Take 1 tablet (500 mg total) by mouth every 6 (six) hours as needed. 08/14/19   Fawze, Mina A, PA-C  albuterol (PROVENTIL HFA;VENTOLIN HFA) 108 (90 Base) MCG/ACT inhaler Inhale 1-2 puffs into the lungs every 6 (six) hours as needed for wheezing or shortness of breath. Patient not taking: Reported on 11/29/2018 05/26/16   Hagler, Jami L, PA-C  benzonatate (TESSALON PERLES) 100 MG capsule Take 1 capsule (100 mg total) by mouth 3 (three) times daily as needed. 08/05/19   Daphine Deutscher, Mary-Margaret, FNP  cefdinir (OMNICEF) 300 MG capsule Take 1 capsule (300 mg total) by mouth 2 (two) times daily. Patient not taking: Reported on 11/29/2018 09/08/18   Faythe Ghee, PA-C  cyclobenzaprine (FLEXERIL) 10 MG tablet Take 1 tablet (10 mg total) by mouth 2 (two) times daily as needed for muscle spasms. 08/14/19   Fawze, Mina A, PA-C    dicyclomine (BENTYL) 10 MG capsule Take 1 capsule (10 mg total) by mouth 4 (four) times daily for 14 days. 09/26/19 10/10/19  Orvil Feil, PA-C  HYDROcodone-acetaminophen (NORCO/VICODIN) 5-325 MG tablet Take 1 tablet by mouth every 6 (six) hours as needed for moderate pain. Patient not taking: Reported on 01/18/2019 11/29/18   Arnaldo Natal, MD  ibuprofen (ADVIL) 800 MG tablet Take 1 tablet (800 mg total) by mouth every 8 (eight) hours as needed for moderate pain. 06/17/19   Irean Hong, MD  metFORMIN (GLUCOPHAGE) 1000 MG tablet Take 0.5 tablets (500 mg total) by mouth 2 (two) times daily with a meal. 09/08/18   Fisher, Roselyn Bering, PA-C  naproxen (NAPROSYN) 500 MG tablet Take 1 tablet (500 mg total) by mouth 2 (two) times daily with a meal. 08/14/19   Fawze, Mina A, PA-C  ondansetron (ZOFRAN) 4 MG tablet Take 1 tablet (4 mg total) by mouth every 8 (eight) hours as needed for nausea or vomiting. 09/26/19   Orvil Feil, PA-C  ondansetron (ZOFRAN-ODT) 4 MG disintegrating tablet Take 1 tablet (4 mg total) by mouth every 8 (eight) hours as needed for nausea or vomiting. Patient not taking: Reported on 11/29/2018 09/08/18   Faythe Ghee, PA-C  oxyCODONE-acetaminophen (PERCOCET/ROXICET) 5-325 MG tablet Take 1 tablet by mouth every 4 (four) hours as needed for severe pain. 06/17/19   Irean Hong,  MD    Allergies Penicillins    Social History Social History   Tobacco Use  . Smoking status: Current Every Day Smoker    Packs/day: 1.00  . Smokeless tobacco: Never Used  Substance Use Topics  . Alcohol use: No  . Drug use: No    Review of Systems Patient denies headaches, rhinorrhea, blurry vision, numbness, shortness of breath, chest pain, edema, cough, abdominal pain, nausea, vomiting, diarrhea, dysuria, fevers, rashes or hallucinations unless otherwise stated above in HPI. ____________________________________________   PHYSICAL EXAM:  VITAL SIGNS: Vitals:   01/25/20 1404 01/25/20 1430   BP: 132/64 131/68  Pulse: (!) 113 (!) 107  Resp: (!) 23 (!) 22  Temp:    SpO2: 93% 93%    Constitutional: Alert and oriented. Uncomfortable appearing Eyes: Conjunctivae are normal.  Head: Atraumatic. Nose: No congestion/rhinnorhea. Mouth/Throat: Mucous membranes are moist.   Neck: No stridor. Painless ROM.  Cardiovascular: Normal rate, regular rhythm. Grossly normal heart sounds.  Good peripheral circulation. Respiratory: Normal respiratory effort.  No retractions. Lungs CTAB. Gastrointestinal: Soft and nontender. No distention. No abdominal bruits. + left CVA tenderness. Genitourinary:  Musculoskeletal: No lower extremity tenderness nor edema.  No joint effusions. Neurologic:  Normal speech and language. No gross focal neurologic deficits are appreciated. No facial droop Skin:  Skin is warm, dry and intact. No rash noted. Psychiatric: Mood and affect are normal. Speech and behavior are normal.  ____________________________________________   LABS (all labs ordered are listed, but only abnormal results are displayed)  Results for orders placed or performed during the hospital encounter of 01/25/20 (from the past 24 hour(s))  CBC with Differential/Platelet     Status: Abnormal   Collection Time: 01/25/20  9:49 AM  Result Value Ref Range   WBC 12.7 (H) 4.0 - 10.5 K/uL   RBC 4.97 3.87 - 5.11 MIL/uL   Hemoglobin 13.8 12.0 - 15.0 g/dL   HCT 39.0 30.0 - 92.3 %   MCV 84.1 80.0 - 100.0 fL   MCH 27.8 26.0 - 34.0 pg   MCHC 33.0 30.0 - 36.0 g/dL   RDW 30.0 76.2 - 26.3 %   Platelets 297 150 - 400 K/uL   nRBC 0.0 0.0 - 0.2 %   Neutrophils Relative % 80 %   Neutro Abs 10.1 (H) 1.7 - 7.7 K/uL   Lymphocytes Relative 13 %   Lymphs Abs 1.7 0.7 - 4.0 K/uL   Monocytes Relative 5 %   Monocytes Absolute 0.7 0.1 - 1.0 K/uL   Eosinophils Relative 1 %   Eosinophils Absolute 0.1 0.0 - 0.5 K/uL   Basophils Relative 0 %   Basophils Absolute 0.1 0.0 - 0.1 K/uL   Immature Granulocytes 1 %    Abs Immature Granulocytes 0.08 (H) 0.00 - 0.07 K/uL  Comprehensive metabolic panel     Status: Abnormal   Collection Time: 01/25/20  9:49 AM  Result Value Ref Range   Sodium 133 (L) 135 - 145 mmol/L   Potassium 4.2 3.5 - 5.1 mmol/L   Chloride 97 (L) 98 - 111 mmol/L   CO2 24 22 - 32 mmol/L   Glucose, Bld 371 (H) 70 - 99 mg/dL   BUN 11 6 - 20 mg/dL   Creatinine, Ser 3.35 0.44 - 1.00 mg/dL   Calcium 9.6 8.9 - 45.6 mg/dL   Total Protein 7.7 6.5 - 8.1 g/dL   Albumin 3.8 3.5 - 5.0 g/dL   AST 19 15 - 41 U/L   ALT 18 0 -  44 U/L   Alkaline Phosphatase 58 38 - 126 U/L   Total Bilirubin 1.0 0.3 - 1.2 mg/dL   GFR calc non Af Amer >60 >60 mL/min   GFR calc Af Amer >60 >60 mL/min   Anion gap 12 5 - 15  Urinalysis, Complete w Microscopic     Status: Abnormal   Collection Time: 01/25/20 11:03 AM  Result Value Ref Range   Color, Urine YELLOW (A) YELLOW   APPearance CLOUDY (A) CLEAR   Specific Gravity, Urine 1.027 1.005 - 1.030   pH 6.0 5.0 - 8.0   Glucose, UA >=500 (A) NEGATIVE mg/dL   Hgb urine dipstick MODERATE (A) NEGATIVE   Bilirubin Urine NEGATIVE NEGATIVE   Ketones, ur NEGATIVE NEGATIVE mg/dL   Protein, ur 100 (A) NEGATIVE mg/dL   Nitrite NEGATIVE NEGATIVE   Leukocytes,Ua LARGE (A) NEGATIVE   RBC / HPF 21-50 0 - 5 RBC/hpf   WBC, UA >50 (H) 0 - 5 WBC/hpf   Bacteria, UA RARE (A) NONE SEEN   Squamous Epithelial / LPF 0-5 0 - 5   WBC Clumps PRESENT    Mucus PRESENT   Pregnancy, urine     Status: None   Collection Time: 01/25/20 11:03 AM  Result Value Ref Range   Preg Test, Ur NEGATIVE NEGATIVE  Lactic acid, plasma     Status: Abnormal   Collection Time: 01/25/20  2:05 PM  Result Value Ref Range   Lactic Acid, Venous 2.2 (HH) 0.5 - 1.9 mmol/L   ____________________________________________ ____________________________________________  RADIOLOGY  I personally reviewed all radiographic images ordered to evaluate for the above acute complaints and reviewed radiology reports and  findings.  These findings were personally discussed with the patient.  Please see medical record for radiology report.  ____________________________________________   PROCEDURES  Procedure(s) performed:  .Critical Care Performed by: Merlyn Lot, MD Authorized by: Merlyn Lot, MD   Critical care provider statement:    Critical care time (minutes):  10   Critical care time was exclusive of:  Separately billable procedures and treating other patients   Critical care was necessary to treat or prevent imminent or life-threatening deterioration of the following conditions:  Sepsis   Critical care was time spent personally by me on the following activities:  Development of treatment plan with patient or surrogate, discussions with consultants, evaluation of patient's response to treatment, examination of patient, obtaining history from patient or surrogate, ordering and performing treatments and interventions, ordering and review of laboratory studies, ordering and review of radiographic studies, pulse oximetry, re-evaluation of patient's condition and review of old charts      Critical Care performed: yes ____________________________________________   INITIAL IMPRESSION / [place] / ED COURSE  Pertinent labs & imaging results that were available during my care of the patient were reviewed by me and considered in my medical decision making (see chart for details).   DDX: Maryruth Bun, PID, torsion, ovarian cyst, colitis COVID-19     TAJANAY HURLEY is a 34 y.o. who presents to the ED with symptoms as described above.  Patient very uncomfortable appearing presentation concerning for stone versus possible pyelonephritis that she does have flank pain.  No fever mildly tachycardic will give pain medication will check urine will get imaging and reassess  Clinical Course as of Jan 25 1452  Sun Jan 25, 2020  1320 Patient still a bit tachycardic will give additional IV  hydration.  Will give a dose of IV Rocephin as I suspect  this is related to pyelonephritis.  No sign of stone.  Imaging would be consistent with pyonephritis in this clinical setting.  Will reassess.   [PR]  1428 Upon trialing p.o. patient started having worsening pain vomiting became diaphoretic tachycardic as well very ill-appearing.  Was given additional IV bolus.  Found to be febrile.  Will obtain blood cultures and lactate.  She looks ill she meets sepsis criteria and not tolerating PO will discuss with hospitalist for admission.   [PR]    Clinical Course User Index [PR] Willy Eddy, MD    The patient was evaluated in Emergency Department today for the symptoms described in the history of present illness. He/she was evaluated in the context of the global COVID-19 pandemic, which necessitated consideration that the patient might be at risk for infection with the SARS-CoV-2 virus that causes COVID-19. Institutional protocols and algorithms that pertain to the evaluation of patients at risk for COVID-19 are in a state of rapid change based on information released by regulatory bodies including the CDC and federal and state organizations. These policies and algorithms were followed during the patient's care in the ED.  As part of my medical decision making, I reviewed the following data within the electronic MEDICAL RECORD NUMBER Nursing notes reviewed and incorporated, Labs reviewed, notes from prior ED visits and [place] Controlled Substance Database   ____________________________________________   FINAL CLINICAL IMPRESSION(S) / ED DIAGNOSES  Final diagnoses:  Pyelonephritis      NEW MEDICATIONS STARTED DURING THIS VISIT:  New Prescriptions   No medications on file     Note:  This document was prepared using Dragon voice recognition software and may include unintentional dictation errors.    Willy Eddy, MD 01/25/20 1453

## 2020-01-26 ENCOUNTER — Encounter: Payer: Self-pay | Admitting: Internal Medicine

## 2020-01-26 DIAGNOSIS — B9620 Unspecified Escherichia coli [E. coli] as the cause of diseases classified elsewhere: Secondary | ICD-10-CM | POA: Diagnosis present

## 2020-01-26 DIAGNOSIS — R7881 Bacteremia: Secondary | ICD-10-CM | POA: Diagnosis present

## 2020-01-26 LAB — BASIC METABOLIC PANEL
Anion gap: 9 (ref 5–15)
BUN: 9 mg/dL (ref 6–20)
CO2: 23 mmol/L (ref 22–32)
Calcium: 8.1 mg/dL — ABNORMAL LOW (ref 8.9–10.3)
Chloride: 99 mmol/L (ref 98–111)
Creatinine, Ser: 0.59 mg/dL (ref 0.44–1.00)
GFR calc Af Amer: >60 mL/min (ref >60)
GFR calc non Af Amer: >60 mL/min (ref >60)
Glucose, Bld: 240 mg/dL — ABNORMAL HIGH (ref 70–99)
Potassium: 3.8 mmol/L (ref 3.5–5.1)
Sodium: 131 mmol/L — ABNORMAL LOW (ref 135–145)

## 2020-01-26 LAB — BLOOD CULTURE ID PANEL (REFLEXED)

## 2020-01-26 LAB — CBC
HCT: 34 % — ABNORMAL LOW (ref 36.0–46.0)
Hemoglobin: 11.4 g/dL — ABNORMAL LOW (ref 12.0–15.0)
MCH: 27.8 pg (ref 26.0–34.0)
MCHC: 33.5 g/dL (ref 30.0–36.0)
MCV: 82.9 fL (ref 80.0–100.0)
Platelets: 199 10*3/uL (ref 150–400)
RBC: 4.1 MIL/uL (ref 3.87–5.11)
RDW: 12.5 % (ref 11.5–15.5)
WBC: 10.8 10*3/uL — ABNORMAL HIGH (ref 4.0–10.5)
nRBC: 0 % (ref 0.0–0.2)

## 2020-01-26 LAB — GLUCOSE, CAPILLARY
Glucose-Capillary: 250 mg/dL — ABNORMAL HIGH (ref 70–99)
Glucose-Capillary: 242 mg/dL — ABNORMAL HIGH (ref 70–99)
Glucose-Capillary: 194 mg/dL — ABNORMAL HIGH (ref 70–99)
Glucose-Capillary: 268 mg/dL — ABNORMAL HIGH (ref 70–99)
Glucose-Capillary: 195 mg/dL — ABNORMAL HIGH (ref 70–99)

## 2020-01-26 LAB — CORTISOL-AM, BLOOD: Cortisol - AM: 38.9 ug/dL — ABNORMAL HIGH (ref 6.7–22.6)

## 2020-01-26 LAB — PROTIME-INR
INR: 1.2 (ref 0.8–1.2)
Prothrombin Time: 14.3 seconds (ref 11.4–15.2)

## 2020-01-26 LAB — PROCALCITONIN: Procalcitonin: 5.55 ng/mL

## 2020-01-26 MED ORDER — ALBUTEROL SULFATE HFA 108 (90 BASE) MCG/ACT IN AERS: 2.0000 | INHALATION_SPRAY | Freq: Four times a day (QID) | RESPIRATORY_TRACT | Status: DC | PRN

## 2020-01-26 MED ORDER — SODIUM CHLORIDE 0.9 % IV SOLN
1.0000 g | Freq: Three times a day (TID) | INTRAVENOUS | Status: DC
  Administered 2020-01-26 – 2020-01-28 (×7): 1 g via INTRAVENOUS
  Filled 2020-01-26 (×10): qty 1

## 2020-01-26 MED ORDER — INSULIN GLARGINE 100 UNIT/ML ~~LOC~~ SOLN
18.0000 [IU] | Freq: Every day | SUBCUTANEOUS | Status: DC
  Administered 2020-01-26 – 2020-01-27 (×2): 18 [IU] via SUBCUTANEOUS
  Filled 2020-01-26 (×3): qty 0.18

## 2020-01-26 MED ORDER — ALBUTEROL SULFATE (2.5 MG/3ML) 0.083% IN NEBU: 2.5000 mg | INHALATION_SOLUTION | Freq: Four times a day (QID) | RESPIRATORY_TRACT | Status: DC | PRN

## 2020-01-26 NOTE — Progress Notes (Signed)
PHARMACY - PHYSICIAN COMMUNICATION CRITICAL VALUE ALERT - BLOOD CULTURE IDENTIFICATION (BCID)  Alexa Dunn is an 34 y.o. female who presented to [facility] on 01/25/2020 with a chief complaint of n/v  Assessment:  Lab reports 3 of 4 bottles + for E Coli, KPC not detected  Name of physician (or Provider) Contacted: Reyes Ivan NP  Current antibiotics: Rocephin  Changes to prescribed antibiotics recommended: change to Meropenem per protocol Recommendations accepted by provider  Results for orders placed or performed during the hospital encounter of 01/25/20  Blood Culture ID Panel (Reflexed) (Collected: 01/25/2020  2:01 PM)  Result Value Ref Range   Enterococcus species NOT DETECTED NOT DETECTED   Listeria monocytogenes NOT DETECTED NOT DETECTED   Staphylococcus species NOT DETECTED NOT DETECTED   Staphylococcus aureus (BCID) NOT DETECTED NOT DETECTED   Streptococcus species NOT DETECTED NOT DETECTED   Streptococcus agalactiae NOT DETECTED NOT DETECTED   Streptococcus pneumoniae NOT DETECTED NOT DETECTED   Streptococcus pyogenes NOT DETECTED NOT DETECTED   Acinetobacter baumannii NOT DETECTED NOT DETECTED   Enterobacteriaceae species DETECTED (A) NOT DETECTED   Enterobacter cloacae complex NOT DETECTED NOT DETECTED   Escherichia coli DETECTED (A) NOT DETECTED   Klebsiella oxytoca NOT DETECTED NOT DETECTED   Klebsiella pneumoniae NOT DETECTED NOT DETECTED   Proteus species NOT DETECTED NOT DETECTED   Serratia marcescens NOT DETECTED NOT DETECTED   Carbapenem resistance NOT DETECTED NOT DETECTED   Haemophilus influenzae NOT DETECTED NOT DETECTED   Neisseria meningitidis NOT DETECTED NOT DETECTED   Pseudomonas aeruginosa NOT DETECTED NOT DETECTED   Candida albicans NOT DETECTED NOT DETECTED   Candida glabrata NOT DETECTED NOT DETECTED   Candida krusei NOT DETECTED NOT DETECTED   Candida parapsilosis NOT DETECTED NOT DETECTED   Candida tropicalis NOT DETECTED NOT DETECTED    [name] A 01/26/2020  4:11 AM

## 2020-01-26 NOTE — Progress Notes (Signed)
MEWS Guidelines - (patients age 32 and over) Yellow MEWS r/t oral temp 103.1 and 102.8. Ice applied, covers removed, and toradol given per [facility] because it is too soon for more tylenol. MD paged.

## 2020-01-26 NOTE — Progress Notes (Addendum)
Progress Note    Alexa Dunn  YJE:[id number] DOB: [birth year]  DOA: 01/25/2020 PCP: Patient, No Pcp Per      Brief Narrative:    Medical records reviewed and are as summarized below:  Alexa Dunn is an 34 y.o. female Alexa Dunn is a 34 y.o. female with medical history significant for asthma and diabetes mellitus who presents to the emergency room for evaluation of a 3-day history of left flank pain associated with frequency of urination, dysuria and hematuria.  She also had associated nausea, vomiting and poor oral intake.      Assessment/Plan:   Principal Problem:   Sepsis ([place]) Active Problems:   Acute pyelonephritis   Diabetes mellitus without complication ([place])   Asthma   E coli bacteremia  E. coli sepsis and bacteremia secondary to acute left-sided pyelonephritis: Blood culture showed E. coli.  Sensitivity report is pending.  Continue empiric IV meropenem.  Analgesics as needed for pain.  Antiemetics as needed for nausea/vomiting.  Type 2 diabetes mellitus with hyperglycemia: Hemoglobin A1c is 9.7.  Use Lantus and NovoLog for glucose control.  Monitor glucose levels.  Mild intermittent asthma: Albuterol inhaler as needed.    Body mass index is 33.27 kg/m.  (Obesity)   Family Communication/Anticipated D/C date and plan/Code Status   DVT prophylaxis: Lovenox Code Status: Full code Family Communication: Plan discussed with patient Disposition Plan:    Status is: Inpatient  Remains inpatient appropriate because:IV treatments appropriate due to intensity of illness or inability to take PO and Inpatient level of care appropriate due to severity of illness   Dispo: The patient is from: Home              Anticipated d/c is to: Home              Anticipated d/c date is: 2 days              Patient currently is not medically stable to d/c.            Subjective:   She had fever in the early hours of the morning with T-max of 102.75F.  Left  flank pain is improving. No nausea or vomiting today  Objective:    Vitals:   01/26/20 0443 01/26/20 0505 01/26/20 0546 01/26/20 0750  BP: (!) 168/75  121/62 122/65  Pulse: (!) 110  96 88  Resp: 16  20 19   Temp: (!) 103.1 F (39.5 C) (!) 102.8 F (39.3 C) 99.1 F (37.3 C) 99.3 F (37.4 C)  TempSrc: Oral Oral Oral Oral  SpO2: 92%  97% 96%  Weight:      Height:       No data found.   Intake/Output Summary (Last 24 hours) at 01/26/2020 1243 Last data filed at 01/26/2020 1000 Gross per 24 hour  Intake 2480 ml  Output --  Net 2480 ml   Filed Weights   01/25/20 0931 01/26/20 0045  Weight: 90.7 kg 90.7 kg    Exam:  GEN: NAD SKIN: Warm and dry EYES: EOMI ENT: MMM CV: RRR PULM: CTA B ABD: soft, obese, NT, +BS CNS: AAO x 3, non focal EXT: No edema or tenderness GU:  Left CVA tenderness   Data Reviewed:   I have personally reviewed following labs and imaging studies:  Labs: Labs show the following:   Basic Metabolic Panel: Recent Labs  Lab 01/25/20 0949 01/26/20 0505  NA 133* 131*  K 4.2 3.8  CL  97* 99  CO2 24 23  GLUCOSE 371* 240*  BUN 11 9  CREATININE 0.65 0.59  CALCIUM 9.6 8.1*   GFR Estimated Creatinine Clearance: 113.4 mL/min (by C-G formula based on SCr of 0.59 mg/dL). Liver Function Tests: Recent Labs  Lab 01/25/20 0949  AST 19  ALT 18  ALKPHOS 58  BILITOT 1.0  PROT 7.7  ALBUMIN 3.8   No results for input(s): LIPASE, AMYLASE in the last 168 hours. No results for input(s): AMMONIA in the last 168 hours. Coagulation profile Recent Labs  Lab 01/26/20 0505  INR 1.2    CBC: Recent Labs  Lab 01/25/20 0949 01/26/20 0505  WBC 12.7* 10.8*  NEUTROABS 10.1*  --   HGB 13.8 11.4*  HCT 41.8 34.0*  MCV 84.1 82.9  PLT 297 199   Cardiac Enzymes: No results for input(s): CKTOTAL, CKMB, CKMBINDEX, TROPONINI in the last 168 hours. BNP (last 3 results) No results for input(s): PROBNP in the last 8760 hours. CBG: Recent Labs  Lab  01/25/20 1704 01/26/20 0109 01/26/20 0750 01/26/20 1143  GLUCAP 298* 250* 242* 194*   D-Dimer: No results for input(s): DDIMER in the last 72 hours. Hgb A1c: Recent Labs    01/25/20 1627  HGBA1C 9.7*   Lipid Profile: No results for input(s): CHOL, HDL, LDLCALC, TRIG, CHOLHDL, LDLDIRECT in the last 72 hours. Thyroid function studies: No results for input(s): TSH, T4TOTAL, T3FREE, THYROIDAB in the last 72 hours.  Invalid input(s): FREET3 Anemia work up: No results for input(s): VITAMINB12, FOLATE, FERRITIN, TIBC, IRON, RETICCTPCT in the last 72 hours. Sepsis Labs: Recent Labs  Lab 01/25/20 0949 01/25/20 1405 01/25/20 1627 01/26/20 0505  PROCALCITON  --   --   --  5.55  WBC 12.7*  --   --  10.8*  LATICACIDVEN  --  2.2* 1.7  --     Microbiology Recent Results (from the past 240 hour(s))  Group A Strep by PCR     Status: None   Collection Time: 01/18/20  3:41 PM   Specimen: Throat; Sterile Swab  Result Value Ref Range Status   Group A Strep by PCR NOT DETECTED NOT DETECTED Final    Comment: Performed at [facility], [address] [place], [place] [address]  Blood culture (routine x 2)     Status: None (Preliminary result)   Collection Time: 01/25/20  1:59 PM   Specimen: BLOOD  Result Value Ref Range Status   Specimen Description BLOOD BLOOD RIGHT FOREARM  Final   Special Requests   Final    BOTTLES DRAWN AEROBIC AND ANAEROBIC Blood Culture adequate volume   Culture  Setup Time   Final    GRAM NEGATIVE RODS IN BOTH AEROBIC AND ANAEROBIC BOTTLES CRITICAL RESULT CALLED TO, READ BACK BY AND VERIFIED WITH: SCOTT HALL AT 0255 ON 01/26/20 RWW Performed at [facility], [address] [place], [place] [address]    Culture GRAM NEGATIVE RODS  Final   Report Status PENDING  Incomplete  Blood culture (routine x 2)     Status: None (Preliminary result)   Collection Time: 01/25/20  2:01 PM   Specimen: BLOOD  Result Value Ref Range Status   Specimen  Description BLOOD BLOOD RIGHT ARM  Final   Special Requests   Final    BOTTLES DRAWN AEROBIC AND ANAEROBIC Blood Culture results may not be optimal due to an excessive volume of blood received in culture bottles   Culture  Setup Time   Final  Organism ID to follow GRAM NEGATIVE RODS IN BOTH AEROBIC AND ANAEROBIC BOTTLES CRITICAL RESULT CALLED TO, READ BACK BY AND VERIFIED WITH: SCOTT HALL AT 0255 ON 01/26/20 RWW Performed at [facility], [address] [place], [place] [address]    Culture GRAM NEGATIVE RODS  Final   Report Status PENDING  Incomplete  Blood Culture ID Panel (Reflexed)     Status: Abnormal   Collection Time: 01/25/20  2:01 PM  Result Value Ref Range Status   Enterococcus species NOT DETECTED NOT DETECTED Final   Listeria monocytogenes NOT DETECTED NOT DETECTED Final   Staphylococcus species NOT DETECTED NOT DETECTED Final   Staphylococcus aureus (BCID) NOT DETECTED NOT DETECTED Final   Streptococcus species NOT DETECTED NOT DETECTED Final   Streptococcus agalactiae NOT DETECTED NOT DETECTED Final   Streptococcus pneumoniae NOT DETECTED NOT DETECTED Final   Streptococcus pyogenes NOT DETECTED NOT DETECTED Final   Acinetobacter baumannii NOT DETECTED NOT DETECTED Final   Enterobacteriaceae species DETECTED (A) NOT DETECTED Final    Comment: Enterobacteriaceae represent a large family of gram-negative bacteria, not a single organism. CRITICAL RESULT CALLED TO, READ BACK BY AND VERIFIED WITH: SCOTT HALL AT 0255 ON 01/26/20 RWW    Enterobacter cloacae complex NOT DETECTED NOT DETECTED Final   Escherichia coli DETECTED (A) NOT DETECTED Final    Comment: CRITICAL RESULT CALLED TO, READ BACK BY AND VERIFIED WITH: SCOTT HALL AT 0255 ON 01/26/20 RWW    Klebsiella oxytoca NOT DETECTED NOT DETECTED Final   Klebsiella pneumoniae NOT DETECTED NOT DETECTED Final   Proteus species NOT DETECTED NOT DETECTED Final   Serratia marcescens NOT DETECTED NOT DETECTED Final    Carbapenem resistance NOT DETECTED NOT DETECTED Final   Haemophilus influenzae NOT DETECTED NOT DETECTED Final   Neisseria meningitidis NOT DETECTED NOT DETECTED Final   Pseudomonas aeruginosa NOT DETECTED NOT DETECTED Final   Candida albicans NOT DETECTED NOT DETECTED Final   Candida glabrata NOT DETECTED NOT DETECTED Final   Candida krusei NOT DETECTED NOT DETECTED Final   Candida parapsilosis NOT DETECTED NOT DETECTED Final   Candida tropicalis NOT DETECTED NOT DETECTED Final    Comment: Performed at [facility], [address] [place], [place] [address]  SARS Coronavirus 2 by RT PCR (hospital order, performed in [facility] hospital lab) Nasopharyngeal Nasopharyngeal Swab     Status: None   Collection Time: 01/25/20  2:06 PM   Specimen: Nasopharyngeal Swab  Result Value Ref Range Status   SARS Coronavirus 2 NEGATIVE NEGATIVE Final    Comment: (NOTE) SARS-CoV-2 target nucleic acids are NOT DETECTED. The SARS-CoV-2 RNA is generally detectable in upper and lower respiratory specimens during the acute phase of infection. The lowest concentration of SARS-CoV-2 viral copies this assay can detect is 250 copies / mL. A negative result does not preclude SARS-CoV-2 infection and should not be used as the sole basis for treatment or other patient management decisions.  A negative result may occur with improper specimen collection / handling, submission of specimen other than nasopharyngeal swab, presence of viral mutation(s) within the areas targeted by this assay, and inadequate number of viral copies (<250 copies / mL). A negative result must be combined with clinical observations, patient history, and epidemiological information. Fact Sheet for Patients:   BoilerBrush.com.cy Fact Sheet for Healthcare Providers: https://pope.com/ This test is not yet approved or cleared  by the [place] FDA and has been authorized for  detection and/or diagnosis of SARS-CoV-2 by FDA under  an Emergency Use Authorization (EUA).  This EUA will remain in effect (meaning this test can be used) for the duration of the COVID-19 declaration under Section 564(b)(1) of the Act, 21 U.S.C. section 360bbb-3(b)(1), unless the authorization is terminated or revoked sooner. Performed at [facility], [address] [place], [place] [address]     Procedures and diagnostic studies:  CT Renal Stone Study  Result Date: 01/25/2020 CLINICAL DATA:  Flank pain.  Suspected nephrolithiasis. EXAM: CT ABDOMEN AND PELVIS WITHOUT CONTRAST TECHNIQUE: Multidetector CT imaging of the abdomen and pelvis was performed following the standard protocol without IV contrast. COMPARISON:  CT abdomen pelvis-11/29/2018; 08/27/2015 FINDINGS: The lack of intravenous contrast limits the ability to evaluate solid abdominal organs. Lower chest: Limited visualization of the lower thorax is negative for focal airspace opacity or pleural effusion. Normal heart size.  No pericardial effusion. Hepatobiliary: Hepatomegaly with mild nodularity hepatic contour and partial recanalization of the periumbilical vein. Normal noncontrast appearance of the gallbladder given degree distention. No radiopaque gallstones. No ascites. Pancreas: Normal noncontrast appearance of the pancreas. Spleen: Borderline splenomegaly with the spleen measuring 12.1 cm in diameter (coronal image 89, series 5). Adrenals/Urinary Tract: Mild asymmetric left-sided ureterectasis and slightly asymmetric left-sided perinephric stranding without associated pelvicaliectasis, the etiology of which is not depicted on this examination. Specifically, no evidence of nephrolithiasis. No renal stones are seen along expected course of either ureter or the urinary bladder. Normal noncontrast appearance the urinary bladder given degree distention. Normal noncontrast appearance of the bilateral adrenal glands.  Stomach/Bowel: Moderate colonic stool burden without evidence of enteric obstruction. Normal noncontrast appearance of the terminal ileum and the retrocecal appendix. No pneumoperitoneum, pneumatosis or portal venous gas. Vascular/Lymphatic: Normal caliber of the abdominal aorta. Scattered retroperitoneal lymph nodes are numerous though individually not enlarged by size criteria with index left-sided periaortic lymph node measuring 0.7 cm in greatest short axis diameter (image 37, series 2), presumably reactive in etiology. No definitive bulky retroperitoneal, mesenteric, pelvic or inguinal lymphadenopathy on this noncontrast examination. Reproductive: Dystrophic calcifications within the left adnexa. Otherwise, normal noncontrast appearance of the pelvic organs for age. No discrete adnexal lesions on this noncontrast examination. No free fluid in the pelvic cul-de-sac. Other: Tiny mesenteric fat containing Peri umbilical hernia. Musculoskeletal: No acute or aggressive osseous abnormalities. 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 IMPRESSION: 1. Mild left-sided ureterectasis and asymmetric left-sided perinephric stranding without associated pelvicaliectasis, the etiology of which is not depicted on this examination. Specifically, no evidence of nephrolithiasis. Correlation with urinalysis is advised. 2. Hepatomegaly with nodularity of the hepatic contour, borderline splenomegaly and recanalization of the periumbilical vein, constellation of findings worrisome for early cirrhotic change. Correlation with LFTs is advised. Electronically Signed   By: Simonne Come M.D.   On: 01/25/2020 13:08    Medications:   . enoxaparin (LOVENOX) injection  40 mg Subcutaneous Q24H  . insulin aspart  0-15 Units Subcutaneous TID WC  . insulin glargine  18 Units Subcutaneous Daily   Continuous Infusions: . sodium chloride 125 mL/hr at 01/26/20 0100  . meropenem (MERREM)  IV 1 g (01/26/20 0502)     LOS: 1 day   [place]  [facility]     01/26/2020, 12:43 PM

## 2020-01-26 NOTE — Progress Notes (Addendum)
Inpatient Diabetes Program Recommendations  AACE/ADA: New Consensus Statement on Inpatient Glycemic Control   Target Ranges:  Prepandial:   less than 140 mg/dL      Peak postprandial:   less than 180 mg/dL (1-2 hours)      Critically ill patients:  140 - 180 mg/dL  Results for [name], [name] (MRN [id number]) as of 01/26/2020 07:48  Ref. Range 01/25/2020 17:04 01/26/2020 01:09 01/26/2020 07:50  Glucose-Capillary Latest Ref Range: 70 - 99 mg/dL 761 (H) 950 (H) 932 (H)   Results for Alexa Dunn, Alexa Dunn (MRN [id number]) as of 01/26/2020 07:48  Ref. Range 01/25/2020 16:27  Hemoglobin A1C Latest Ref Range: 4.8 - 5.6 % 9.7 (H)   Review of Glycemic Control  Outpatient Diabetes medications: 70/30 18 units BID Current orders for Inpatient glycemic control: Novolog 0-15 units TID with meals  Inpatient Diabetes Program Recommendations:   Insulin-Basal: Please consider ordering Lantus 15 units Q24H starting now.  Insulin-Correction: Please consider ordering Novolog 0-5 units QHS.  HbgA1C:  A1C 9.7% on 01/25/20 indicating an average glucose of 232 mg/dl over the past 2-3 months.  Addendum 01/26/20@12 :40-Spoke with patient about diabetes and home regimen for diabetes control. Patient reports that she does not have a PCP and she has been purchasing Novolin 70/30 insulin from [facility] over the counter for DM control. Patient states she is taking 70/30 18 units BID and that that her glucose is usually 100-200's mg/dl.  Patient is not sure when her A1C was last checked.  Discussed A1C results (9.7% on 01/25/20) and explained that current A1C indicates an average glucose of 232 mg/dl over the past 2-3 months. Discussed glucose and A1C goals. Discussed importance of checking CBGs and maintaining good CBG control to prevent long-term and short-term complications. Informed patient that TOC has been consulted to assist with arranging follow up and to assist with medications.  Patient verbalized understanding of information  discussed and reports no further questions at this time related to diabetes. However, patient states that she feels feverish and nauseated. Informed patient that I would like the secretary know how she is feeling to see if RN can check on her and see if she is able to get any medication for a fever or nausea at this time. Informed Diplomatic Services operational officer of patient's complaints and ask that she let RN know.  Thanks, Orlando Penner, RN, MSN, CDE Diabetes Coordinator Inpatient Diabetes Program [phone] (Team Pager from 8am to 5pm)

## 2020-01-26 NOTE — Progress Notes (Signed)
Pharmacy Antibiotic Note  Alexa Dunn is a 34 y.o. female admitted on 01/25/2020 with bacteremia.  Pharmacy has been consulted for Meropenem dosing.  Plan: Meropenem 1gm IV q8hrs  Height: 5\' 5"  (165.1 cm) Weight: 90.7 kg (199 lb 15.3 oz) IBW/kg (Calculated) : 57  Temp (24hrs), Avg:99.8 F (37.7 C), Min:98.3 F (36.8 C), Max:102.9 F (39.4 C)  Recent Labs  Lab 01/25/20 0949 01/25/20 1405 01/25/20 1627  WBC 12.7*  --   --   CREATININE 0.65  --   --   LATICACIDVEN  --  2.2* 1.7    Estimated Creatinine Clearance: 113.4 mL/min (by C-G formula based on SCr of 0.65 mg/dL).    Allergies  Allergen Reactions  . Penicillins Hives    Other reaction(s): UNKNOWN    Antimicrobials this admission:   >>    >>   Dose adjustments this admission:   Microbiology results:  BCx:   UCx:    Sputum:    MRSA PCR:   Thank you for allowing pharmacy to be a part of this patient's care.  01/27/20 A 01/26/2020 4:13 AM

## 2020-01-27 LAB — BASIC METABOLIC PANEL
Anion gap: 6 (ref 5–15)
BUN: 6 mg/dL (ref 6–20)
CO2: 25 mmol/L (ref 22–32)
Calcium: 7.6 mg/dL — ABNORMAL LOW (ref 8.9–10.3)
Chloride: 101 mmol/L (ref 98–111)
Creatinine, Ser: 0.49 mg/dL (ref 0.44–1.00)
GFR calc Af Amer: >60 mL/min (ref >60)
GFR calc non Af Amer: >60 mL/min (ref >60)
Glucose, Bld: 193 mg/dL — ABNORMAL HIGH (ref 70–99)
Potassium: 3.6 mmol/L (ref 3.5–5.1)
Sodium: 132 mmol/L — ABNORMAL LOW (ref 135–145)

## 2020-01-27 LAB — GLUCOSE, CAPILLARY
Glucose-Capillary: 169 mg/dL — ABNORMAL HIGH (ref 70–99)
Glucose-Capillary: 249 mg/dL — ABNORMAL HIGH (ref 70–99)
Glucose-Capillary: 224 mg/dL — ABNORMAL HIGH (ref 70–99)
Glucose-Capillary: 257 mg/dL — ABNORMAL HIGH (ref 70–99)

## 2020-01-27 LAB — CBC
HCT: 31.8 % — ABNORMAL LOW (ref 36.0–46.0)
Hemoglobin: 10.7 g/dL — ABNORMAL LOW (ref 12.0–15.0)
MCH: 27.6 pg (ref 26.0–34.0)
MCHC: 33.6 g/dL (ref 30.0–36.0)
MCV: 82 fL (ref 80.0–100.0)
Platelets: 159 10*3/uL (ref 150–400)
RBC: 3.88 MIL/uL (ref 3.87–5.11)
RDW: 12.5 % (ref 11.5–15.5)
WBC: 6.1 10*3/uL (ref 4.0–10.5)
nRBC: 0 % (ref 0.0–0.2)

## 2020-01-27 LAB — MAGNESIUM: Magnesium: 1.5 mg/dL — ABNORMAL LOW (ref 1.7–2.4)

## 2020-01-27 MED ORDER — POTASSIUM CHLORIDE CRYS ER 20 MEQ PO TBCR
40.0000 meq | EXTENDED_RELEASE_TABLET | Freq: Once | ORAL | Status: AC
  Administered 2020-01-27: 40 meq via ORAL
  Filled 2020-01-27: qty 2

## 2020-01-27 MED ORDER — MAGNESIUM SULFATE 2 GM/50ML IV SOLN
2.0000 g | Freq: Once | INTRAVENOUS | Status: AC
  Administered 2020-01-27: 2 g via INTRAVENOUS
  Filled 2020-01-27: qty 50

## 2020-01-27 NOTE — Progress Notes (Signed)
Progress Note    Alexa Dunn  JIR:[id number] DOB: 1986/06/10  DOA: 01/25/2020 PCP: Patient, No Pcp Per      Brief Narrative:    Medical records reviewed and are as summarized below:  Alexa Dunn is an 34 y.o. female Alexa Dunn is a 34 y.o. female with medical history significant for asthma and diabetes mellitus who presents to the emergency room for evaluation of a 3-day history of left flank pain associated with frequency of urination, dysuria and hematuria.  She also had associated nausea, vomiting and poor oral intake.      Assessment/Plan:   Principal Problem:   Sepsis (HCC) Active Problems:   Acute pyelonephritis   Diabetes mellitus without complication (HCC)   Asthma   E coli bacteremia  E. coli sepsis and bacteremia secondary to acute left-sided pyelonephritis: Blood culture showed E. coli.  Sensitivity report is pending.  Continue empiric IV meropenem.  Analgesics as needed for pain.  Antiemetics as needed for nausea/vomiting.  Type 2 diabetes mellitus with hyperglycemia: Hemoglobin A1c is 9.7.  Use Lantus and NovoLog for glucose control.  Monitor glucose levels.  We will be cautious with insulin since patient's appetite is not back to baseline.  Mild intermittent asthma: Albuterol inhaler as needed.    Body mass index is 33.27 kg/m.  (Obesity)   Family Communication/Anticipated D/C date and plan/Code Status   DVT prophylaxis: Lovenox Code Status: Full code Family Communication: Plan discussed with patient Disposition Plan:    Status is: Inpatient  Remains inpatient appropriate because:IV treatments appropriate due to intensity of illness or inability to take PO and Inpatient level of care appropriate due to severity of illness   Dispo: The patient is from: Home              Anticipated d/c is to: Home              Anticipated d/c date is: 2 days              Patient currently is not medically stable to d/c.            Subjective:     She had significant nausea last night.  She feels better this morning.  No vomiting, fever, abdominal pain or left flank pain.  Objective:    Vitals:   01/26/20 1254 01/26/20 1522 01/26/20 2256 01/27/20 0745  BP: 132/76 122/68 132/68 (!) 152/79  Pulse: 100 86 87 88  Resp:   18 20  Temp: (!) 100.4 F (38 C) 98.6 F (37 C) 98.7 F (37.1 C) 98.4 F (36.9 C)  TempSrc: Oral Oral Oral Oral  SpO2: 96% 97% 99% 99%  Weight:      Height:       No data found.   Intake/Output Summary (Last 24 hours) at 01/27/2020 1459 Last data filed at 01/27/2020 1413 Gross per 24 hour  Intake 3774.95 ml  Output --  Net 3774.95 ml   Filed Weights   01/25/20 0931 01/26/20 0045  Weight: 90.7 kg 90.7 kg    Exam:  GEN: NAD SKIN: Warm and dry EYES: EOMI ENT: MMM CV: RRR PULM: No pallor or icterus ABD: soft, obese, NT, +BS CNS: AAO x 3, non focal EXT: No edema or tenderness GU:  Left CVA tenderness improved   Data Reviewed:   I have personally reviewed following labs and imaging studies:  Labs: Labs show the following:   Basic Metabolic Panel: Recent Labs  Lab  01/25/20 0949 01/25/20 0949 01/26/20 0505 01/27/20 0838  NA 133*  --  131* 132*  K 4.2   < > 3.8 3.6  CL 97*  --  99 101  CO2 24  --  23 25  GLUCOSE 371*  --  240* 193*  BUN 11  --  9 6  CREATININE 0.65  --  0.59 0.49  CALCIUM 9.6  --  8.1* 7.6*  MG  --   --   --  1.5*   < > = values in this interval not displayed.   GFR Estimated Creatinine Clearance: 113.4 mL/min (by C-G formula based on SCr of 0.49 mg/dL). Liver Function Tests: Recent Labs  Lab 01/25/20 0949  AST 19  ALT 18  ALKPHOS 58  BILITOT 1.0  PROT 7.7  ALBUMIN 3.8   No results for input(s): LIPASE, AMYLASE in the last 168 hours. No results for input(s): AMMONIA in the last 168 hours. Coagulation profile Recent Labs  Lab 01/26/20 0505  INR 1.2    CBC: Recent Labs  Lab 01/25/20 0949 01/26/20 0505 01/27/20 0838  WBC 12.7* 10.8* 6.1   NEUTROABS 10.1*  --   --   HGB 13.8 11.4* 10.7*  HCT 41.8 34.0* 31.8*  MCV 84.1 82.9 82.0  PLT 297 199 159   Cardiac Enzymes: No results for input(s): CKTOTAL, CKMB, CKMBINDEX, TROPONINI in the last 168 hours. BNP (last 3 results) No results for input(s): PROBNP in the last 8760 hours. CBG: Recent Labs  Lab 01/26/20 1143 01/26/20 1705 01/26/20 2108 01/27/20 0747 01/27/20 1148  GLUCAP 194* 268* 195* 169* 249*   D-Dimer: No results for input(s): DDIMER in the last 72 hours. Hgb A1c: Recent Labs    01/25/20 1627  HGBA1C 9.7*   Lipid Profile: No results for input(s): CHOL, HDL, LDLCALC, TRIG, CHOLHDL, LDLDIRECT in the last 72 hours. Thyroid function studies: No results for input(s): TSH, T4TOTAL, T3FREE, THYROIDAB in the last 72 hours.  Invalid input(s): FREET3 Anemia work up: No results for input(s): VITAMINB12, FOLATE, FERRITIN, TIBC, IRON, RETICCTPCT in the last 72 hours. Sepsis Labs: Recent Labs  Lab 01/25/20 0949 01/25/20 1405 01/25/20 1627 01/26/20 0505 01/27/20 0838  PROCALCITON  --   --   --  5.55  --   WBC 12.7*  --   --  10.8* 6.1  LATICACIDVEN  --  2.2* 1.7  --   --     Microbiology Recent Results (from the past 240 hour(s))  Group A Strep by PCR     Status: None   Collection Time: 01/18/20  3:41 PM   Specimen: Throat; Sterile Swab  Result Value Ref Range Status   Group A Strep by PCR NOT DETECTED NOT DETECTED Final    Comment: Performed at [facility], [address] [place], [place] [address]  Blood culture (routine x 2)     Status: Abnormal (Preliminary result)   Collection Time: 01/25/20  1:59 PM   Specimen: BLOOD  Result Value Ref Range Status   Specimen Description   Final    BLOOD BLOOD RIGHT FOREARM Performed at [facility], [address], [place], [place] [address]    Special Requests   Final    BOTTLES DRAWN AEROBIC AND ANAEROBIC Blood Culture adequate volume Performed at [facility], [address], [place], [place] [address]    Culture  Setup Time   Final    GRAM NEGATIVE RODS IN BOTH AEROBIC AND ANAEROBIC BOTTLES CRITICAL RESULT CALLED  TO, READ BACK BY AND VERIFIED WITH: SCOTT HALL AT 0255 ON 01/26/20 RWW Performed at [facility], [address] [place], [place] [address]    Culture ESCHERICHIA COLI (A)  Final   Report Status PENDING  Incomplete  Blood culture (routine x 2)     Status: Abnormal (Preliminary result)   Collection Time: 01/25/20  2:01 PM   Specimen: BLOOD  Result Value Ref Range Status   Specimen Description   Final    BLOOD BLOOD RIGHT ARM Performed at [facility], [address] [place], [place] [address]    Special Requests   Final    BOTTLES DRAWN AEROBIC AND ANAEROBIC Blood Culture results may not be optimal due to an excessive volume of blood received in culture bottles Performed at [facility], [address] [place], [place] [address]    Culture  Setup Time   Final    Organism ID to follow GRAM NEGATIVE RODS IN BOTH AEROBIC AND ANAEROBIC BOTTLES CRITICAL RESULT CALLED TO, READ BACK BY AND VERIFIED WITH: SCOTT HALL AT 0255 ON 01/26/20 RWW Performed at [facility], [address] [place], [place] [address]    Culture ESCHERICHIA COLI (A)  Final   Report Status PENDING  Incomplete  Blood Culture ID Panel (Reflexed)     Status: Abnormal   Collection Time: 01/25/20  2:01 PM  Result Value Ref Range Status   Enterococcus species NOT DETECTED NOT DETECTED Final   Listeria monocytogenes NOT DETECTED NOT DETECTED Final   Staphylococcus species NOT DETECTED NOT DETECTED Final   Staphylococcus aureus (BCID) NOT DETECTED NOT DETECTED Final   Streptococcus species NOT DETECTED NOT DETECTED Final   Streptococcus agalactiae NOT DETECTED NOT DETECTED Final   Streptococcus pneumoniae NOT DETECTED NOT DETECTED Final   Streptococcus pyogenes NOT DETECTED NOT DETECTED Final   Acinetobacter baumannii NOT DETECTED NOT  DETECTED Final   Enterobacteriaceae species DETECTED (A) NOT DETECTED Final    Comment: Enterobacteriaceae represent a large family of gram-negative bacteria, not a single organism. CRITICAL RESULT CALLED TO, READ BACK BY AND VERIFIED WITH: SCOTT HALL AT 0255 ON 01/26/20 RWW    Enterobacter cloacae complex NOT DETECTED NOT DETECTED Final   Escherichia coli DETECTED (A) NOT DETECTED Final    Comment: CRITICAL RESULT CALLED TO, READ BACK BY AND VERIFIED WITH: SCOTT HALL AT 0255 ON 01/26/20 RWW    Klebsiella oxytoca NOT DETECTED NOT DETECTED Final   Klebsiella pneumoniae NOT DETECTED NOT DETECTED Final   Proteus species NOT DETECTED NOT DETECTED Final   Serratia marcescens NOT DETECTED NOT DETECTED Final   Carbapenem resistance NOT DETECTED NOT DETECTED Final   Haemophilus influenzae NOT DETECTED NOT DETECTED Final   Neisseria meningitidis NOT DETECTED NOT DETECTED Final   Pseudomonas aeruginosa NOT DETECTED NOT DETECTED Final   Candida albicans NOT DETECTED NOT DETECTED Final   Candida glabrata NOT DETECTED NOT DETECTED Final   Candida krusei NOT DETECTED NOT DETECTED Final   Candida parapsilosis NOT DETECTED NOT DETECTED Final   Candida tropicalis NOT DETECTED NOT DETECTED Final    Comment: Performed at [facility], [address] [place], [place] [address]  SARS Coronavirus 2 by RT PCR (hospital order, performed in [facility] hospital lab) Nasopharyngeal Nasopharyngeal Swab     Status: None   Collection Time: 01/25/20  2:06 PM   Specimen: Nasopharyngeal Swab  Result Value Ref Range Status   SARS Coronavirus 2 NEGATIVE NEGATIVE Final    Comment: (NOTE) SARS-CoV-2  target nucleic acids are NOT DETECTED. The SARS-CoV-2 RNA is generally detectable in upper and lower respiratory specimens during the acute phase of infection. The lowest concentration of SARS-CoV-2 viral copies this assay can detect is 250 copies / mL. A negative result does not preclude SARS-CoV-2  infection and should not be used as the sole basis for treatment or other patient management decisions.  A negative result may occur with improper specimen collection / handling, submission of specimen other than nasopharyngeal swab, presence of viral mutation(s) within the areas targeted by this assay, and inadequate number of viral copies (<250 copies / mL). A negative result must be combined with clinical observations, patient history, and epidemiological information. Fact Sheet for Patients:   StrictlyIdeas.no Fact Sheet for Healthcare Providers: BankingDealers.co.za This test is not yet approved or cleared  by the [place] FDA and has been authorized for detection and/or diagnosis of SARS-CoV-2 by FDA under an Emergency Use Authorization (EUA).  This EUA will remain in effect (meaning this test can be used) for the duration of the COVID-19 declaration under Section 564(b)(1) of the Act, 21 U.S.C. section 360bbb-3(b)(1), unless the authorization is terminated or revoked sooner. Performed at [facility], [address] [place], [place] [address]     Procedures and diagnostic studies:  No results found.  Medications:   . enoxaparin (LOVENOX) injection  40 mg Subcutaneous Q24H  . insulin aspart  0-15 Units Subcutaneous TID WC  . insulin glargine  18 Units Subcutaneous Daily   Continuous Infusions: . meropenem (MERREM) IV 1 g (01/27/20 0542)     LOS: 2 days   Keyron Pokorski  [facility]     01/27/2020, 2:59 PM

## 2020-01-27 NOTE — Plan of Care (Signed)
  Problem: Education: Goal: Knowledge of General Education information will improve Description: Including pain rating scale, medication(s)/side effects and non-pharmacologic comfort measures Outcome: Progressing   Problem: Clinical Measurements: Goal: Cardiovascular complication will be avoided Outcome: Progressing   Problem: Activity: Goal: Risk for activity intolerance will decrease Outcome: Progressing   Problem: Coping: Goal: Level of anxiety will decrease Outcome: Progressing   Problem: Elimination: Goal: Will not experience complications related to bowel motility Outcome: Progressing Goal: Will not experience complications related to urinary retention Outcome: Progressing

## 2020-01-27 NOTE — Progress Notes (Signed)
Inpatient Diabetes Program Recommendations  AACE/ADA: New Consensus Statement on Inpatient Glycemic Control   Target Ranges:  Prepandial:   less than 140 mg/dL      Peak postprandial:   less than 180 mg/dL (1-2 hours)      Critically ill patients:  140 - 180 mg/dL   Results for SHAQUITA, FORT (MRN [id number]) as of 01/27/2020 09:01  Ref. Range 01/26/2020 07:50 01/26/2020 11:43 01/26/2020 17:05 01/26/2020 21:08 01/27/2020 07:47  Glucose-Capillary Latest Ref Range: 70 - 99 mg/dL 606 (H) 770 (H) 340 (H) 195 (H) 169 (H)   Review of Glycemic Control  Outpatient Diabetes medications: 70/30 18 units BID Current orders for Inpatient glycemic control: Lantus 18 units daily, Novolog 0-15 units TID with meals  Inpatient Diabetes Program Recommendations:   Insulin-Correction: Please consider ordering Novolog 0-5 units QHS.  Insulin-Meal Coverage: Please consider ordering Novolog 4 units TID with meals for meal coverage if patient eats at least 50% of meals.  HbgA1C:  A1C 9.7% on 01/25/20 indicating an average glucose of 232 mg/dl over the past 2-3 months. TOC consulted for assistance with arranging follow up and medication assistance.  Thanks, Orlando Penner, RN, MSN, CDE Diabetes Coordinator Inpatient Diabetes Program [phone] (Team Pager from 8am to 5pm)

## 2020-01-28 ENCOUNTER — Telehealth: Payer: Self-pay | Admitting: General Practice

## 2020-01-28 LAB — CULTURE, BLOOD (ROUTINE X 2): Special Requests: ADEQUATE

## 2020-01-28 LAB — GLUCOSE, CAPILLARY: Glucose-Capillary: 241 mg/dL — ABNORMAL HIGH (ref 70–99)

## 2020-01-28 LAB — POTASSIUM: Potassium: 4 mmol/L (ref 3.5–5.1)

## 2020-01-28 LAB — MAGNESIUM: Magnesium: 1.9 mg/dL (ref 1.7–2.4)

## 2020-01-28 MED ORDER — INSULIN NPH ISOPHANE & REGULAR (70-30) 100 UNIT/ML ~~LOC~~ SUSP: 18.0000 [IU] | Freq: Two times a day (BID) | SUBCUTANEOUS | 11 refills | Status: DC

## 2020-01-28 MED ORDER — INSULIN GLARGINE 100 UNIT/ML ~~LOC~~ SOLN
25.0000 [IU] | Freq: Every day | SUBCUTANEOUS | Status: DC
  Filled 2020-01-28: qty 0.25

## 2020-01-28 MED ORDER — LEVOFLOXACIN 500 MG PO TABS: 500.0000 mg | ORAL_TABLET | Freq: Every day | ORAL | 0 refills | Status: AC

## 2020-01-28 MED ORDER — INSULIN GLARGINE 100 UNIT/ML ~~LOC~~ SOLN
22.0000 [IU] | Freq: Every day | SUBCUTANEOUS | Status: DC
  Administered 2020-01-28: 22 [IU] via SUBCUTANEOUS
  Filled 2020-01-28: qty 0.22

## 2020-01-28 MED ORDER — LEVOFLOXACIN 500 MG PO TABS
500.0000 mg | ORAL_TABLET | Freq: Every day | ORAL | Status: DC
  Administered 2020-01-28: 500 mg via ORAL
  Filled 2020-01-28: qty 1

## 2020-01-28 NOTE — Plan of Care (Signed)
  Problem: Education: Goal: Knowledge of General Education information will improve Description: Including pain rating scale, medication(s)/side effects and non-pharmacologic comfort measures Outcome: Progressing   Problem: Health Behavior/Discharge Planning: Goal: Ability to manage health-related needs will improve Outcome: Progressing   Problem: Clinical Measurements: Goal: Respiratory complications will improve Outcome: Progressing Goal: Cardiovascular complication will be avoided Outcome: Progressing   Problem: Activity: Goal: Risk for activity intolerance will decrease Outcome: Progressing   Problem: Elimination: Goal: Will not experience complications related to bowel motility Outcome: Progressing

## 2020-01-28 NOTE — Discharge Summary (Signed)
Physician Discharge Summary  Alexa Dunn QJJ:[id number] DOB: [birth year] DOA: 01/25/2020  PCP: Patient, No Pcp Per  Admit date: 01/25/2020 Discharge date: 01/28/2020  Discharge disposition: Home   Recommendations for Outpatient Follow-Up:   Outpatient follow up with PCP in 1 week   Discharge Diagnosis:   Principal Problem:   Sepsis ([place]) Active Problems:   Acute pyelonephritis   Diabetes mellitus without complication ([place])   Asthma   E coli bacteremia    Discharge Condition: Stable.  Diet recommendation: Diabetic diet  Code status: Full code.    Hospital Course:   [name] is an 34 y.o. female with medical history significant forasthma and diabetes mellitus who presented to the emergency room for evaluation of a 3-day history of left flank pain associated with frequency of urination, dysuria and hematuria.She also had associated nausea, vomiting and poor oral intake.  Work-up revealed sepsis secondary to acute left-sided pyelonephritis.  She was treated with analgesics, IV fluids and empiric IV antibiotics.  Urine culture and blood culture revealed E. Coli.  Her condition has improved and she is deemed stable for discharge to home on oral Levaquin.       Discharge Exam:   Vitals:   01/27/20 2329 01/28/20 0805  BP: 131/70 124/84  Pulse: 76 74  Resp: 16 18  Temp: 98.2 F (36.8 C) (!) 97.2 F (36.2 C)  SpO2: 100% 99%   Vitals:   01/27/20 0745 01/27/20 1552 01/27/20 2329 01/28/20 0805  BP: (!) 152/79 120/68 131/70 124/84  Pulse: 88 76 76 74  Resp: 20  16 18   Temp: 98.4 F (36.9 C) 98 F (36.7 C) 98.2 F (36.8 C) (!) 97.2 F (36.2 C)  TempSrc: Oral  Oral Oral  SpO2: 99% 98% 100% 99%  Weight:      Height:         GEN: NAD SKIN: No rash EYES: EOMI ENT: MMM CV: RRR PULM: CTA B ABD: soft, obese, NT, +BS CNS: AAO x 3, non focal EXT: No edema or tenderness   The results of significant diagnostics from this hospitalization (including  imaging, microbiology, ancillary and laboratory) are listed below for reference.     Procedures and Diagnostic Studies:   CT Renal Stone Study  Result Date: 01/25/2020 CLINICAL DATA:  Flank pain.  Suspected nephrolithiasis. EXAM: CT ABDOMEN AND PELVIS WITHOUT CONTRAST TECHNIQUE: Multidetector CT imaging of the abdomen and pelvis was performed following the standard protocol without IV contrast. COMPARISON:  CT abdomen pelvis-11/29/2018; 08/27/2015 FINDINGS: The lack of intravenous contrast limits the ability to evaluate solid abdominal organs. Lower chest: Limited visualization of the lower thorax is negative for focal airspace opacity or pleural effusion. Normal heart size.  No pericardial effusion. Hepatobiliary: Hepatomegaly with mild nodularity hepatic contour and partial recanalization of the periumbilical vein. Normal noncontrast appearance of the gallbladder given degree distention. No radiopaque gallstones. No ascites. Pancreas: Normal noncontrast appearance of the pancreas. Spleen: Borderline splenomegaly with the spleen measuring 12.1 cm in diameter (coronal image 89, series 5). Adrenals/Urinary Tract: Mild asymmetric left-sided ureterectasis and slightly asymmetric left-sided perinephric stranding without associated pelvicaliectasis, the etiology of which is not depicted on this examination. Specifically, no evidence of nephrolithiasis. No renal stones are seen along expected course of either ureter or the urinary bladder. Normal noncontrast appearance the urinary bladder given degree distention. Normal noncontrast appearance of the bilateral adrenal glands. Stomach/Bowel: Moderate colonic stool burden without evidence of enteric obstruction. Normal noncontrast appearance of the terminal ileum and  the retrocecal appendix. No pneumoperitoneum, pneumatosis or portal venous gas. Vascular/Lymphatic: Normal caliber of the abdominal aorta. Scattered retroperitoneal lymph nodes are numerous though  individually not enlarged by size criteria with index left-sided periaortic lymph node measuring 0.7 cm in greatest short axis diameter (image 37, series 2), presumably reactive in etiology. No definitive bulky retroperitoneal, mesenteric, pelvic or inguinal lymphadenopathy on this noncontrast examination. Reproductive: Dystrophic calcifications within the left adnexa. Otherwise, normal noncontrast appearance of the pelvic organs for age. No discrete adnexal lesions on this noncontrast examination. No free fluid in the pelvic cul-de-sac. Other: Tiny mesenteric fat containing Peri umbilical hernia. Musculoskeletal: No acute or aggressive osseous abnormalities. 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 IMPRESSION: 1. Mild left-sided ureterectasis and asymmetric left-sided perinephric stranding without associated pelvicaliectasis, the etiology of which is not depicted on this examination. Specifically, no evidence of nephrolithiasis. Correlation with urinalysis is advised. 2. Hepatomegaly with nodularity of the hepatic contour, borderline splenomegaly and recanalization of the periumbilical vein, constellation of findings worrisome for early cirrhotic change. Correlation with LFTs is advised. Electronically Signed   By: Simonne ComeJohn  Watts M.D.   On: 01/25/2020 13:08     Labs:   Basic Metabolic Panel: Recent Labs  Lab 01/25/20 0949 01/25/20 0949 01/26/20 0505 01/26/20 0505 01/27/20 0838 01/28/20 0849  NA 133*  --  131*  --  132*  --   K 4.2   < > 3.8   < > 3.6 4.0  CL 97*  --  99  --  101  --   CO2 24  --  23  --  25  --   GLUCOSE 371*  --  240*  --  193*  --   BUN 11  --  9  --  6  --   CREATININE 0.65  --  0.59  --  0.49  --   CALCIUM 9.6  --  8.1*  --  7.6*  --   MG  --   --   --   --  1.5* 1.9   < > = values in this interval not displayed.   GFR Estimated Creatinine Clearance: 113.4 mL/min (by C-G formula based on SCr of 0.49 mg/dL). Liver  Function Tests: Recent Labs  Lab 01/25/20 0949  AST 19  ALT 18  ALKPHOS 58  BILITOT 1.0  PROT 7.7  ALBUMIN 3.8   No results for input(s): LIPASE, AMYLASE in the last 168 hours. No results for input(s): AMMONIA in the last 168 hours. Coagulation profile Recent Labs  Lab 01/26/20 0505  INR 1.2    CBC: Recent Labs  Lab 01/25/20 0949 01/26/20 0505 01/27/20 0838  WBC 12.7* 10.8* 6.1  NEUTROABS 10.1*  --   --   HGB 13.8 11.4* 10.7*  HCT 41.8 34.0* 31.8*  MCV 84.1 82.9 82.0  PLT 297 199 159   Cardiac Enzymes: No results for input(s): CKTOTAL, CKMB, CKMBINDEX, TROPONINI in the last 168 hours. BNP: Invalid input(s): POCBNP CBG: Recent Labs  Lab 01/27/20 0747 01/27/20 1148 01/27/20 1651 01/27/20 2110 01/28/20 0807  GLUCAP 169* 249* 224* 257* 241*   D-Dimer No results for input(s): DDIMER in the last 72 hours. Hgb A1c Recent Labs    01/25/20 1627  HGBA1C 9.7*   Lipid Profile No results for input(s): CHOL, HDL, LDLCALC, TRIG, CHOLHDL, LDLDIRECT in the last 72 hours. Thyroid function studies No results for input(s): TSH, T4TOTAL, T3FREE, THYROIDAB in the last 72 hours.  Invalid  input(s): FREET3 Anemia work up No results for input(s): VITAMINB12, FOLATE, FERRITIN, TIBC, IRON, RETICCTPCT in the last 72 hours. Microbiology Recent Results (from the past 240 hour(s))  Group A Strep by PCR     Status: None   Collection Time: 01/18/20  3:41 PM   Specimen: Throat; Sterile Swab  Result Value Ref Range Status   Group A Strep by PCR NOT DETECTED NOT DETECTED Final    Comment: Performed at [facility], [address] [place], [place] [address]  Blood culture (routine x 2)     Status: Abnormal   Collection Time: 01/25/20  1:59 PM   Specimen: BLOOD  Result Value Ref Range Status   Specimen Description   Final    BLOOD BLOOD RIGHT FOREARM Performed at [facility], [address], [place], [place] [address]    Special Requests   Final     BOTTLES DRAWN AEROBIC AND ANAEROBIC Blood Culture adequate volume Performed at [facility], [address], [place], [place] [address]    Culture  Setup Time   Final    GRAM NEGATIVE RODS IN BOTH AEROBIC AND ANAEROBIC BOTTLES CRITICAL RESULT CALLED TO, READ BACK BY AND VERIFIED WITH: SCOTT HALL AT 0255 ON 01/26/20 RWW Performed at [facility], [address] [place], [place] [address]    Culture (A)  Final    ESCHERICHIA COLI SUSCEPTIBILITIES PERFORMED ON PREVIOUS CULTURE WITHIN THE LAST 5 DAYS. Performed at [facility], [address], [place], [place] [address]    Report Status 01/28/2020 FINAL  Final  Blood culture (routine x 2)     Status: Abnormal   Collection Time: 01/25/20  2:01 PM   Specimen: BLOOD  Result Value Ref Range Status   Specimen Description   Final    BLOOD BLOOD RIGHT ARM Performed at [facility], [address], [place], [place] [address]    Special Requests   Final    BOTTLES DRAWN AEROBIC AND ANAEROBIC Blood Culture results may not be optimal due to an excessive volume of blood received in culture bottles Performed at [facility], [address] [place], [place] [address]    Culture  Setup Time   Final    Organism ID to follow GRAM NEGATIVE RODS IN BOTH AEROBIC AND ANAEROBIC BOTTLES CRITICAL RESULT CALLED TO, READ BACK BY AND VERIFIED WITH: SCOTT HALL AT 0255 ON 01/26/20 RWW Performed at [facility], [address] [place], [place] [address]    Culture ESCHERICHIA COLI (A)  Final   Report Status 01/28/2020 FINAL  Final   Organism ID, Bacteria ESCHERICHIA COLI  Final      Susceptibility   Escherichia coli - MIC*    AMPICILLIN >=32 RESISTANT Resistant     CEFAZOLIN <=4 SENSITIVE Sensitive     CEFEPIME <=1 SENSITIVE Sensitive     CEFTAZIDIME <=1 SENSITIVE Sensitive     CEFTRIAXONE <=1 SENSITIVE Sensitive     CIPROFLOXACIN <=0.25 SENSITIVE Sensitive     GENTAMICIN <=1 SENSITIVE Sensitive      IMIPENEM <=0.25 SENSITIVE Sensitive     TRIMETH/SULFA <=20 SENSITIVE Sensitive     AMPICILLIN/SULBACTAM 16 INTERMEDIATE Intermediate     PIP/TAZO <=4 SENSITIVE Sensitive     * ESCHERICHIA COLI  Blood Culture ID Panel (Reflexed)     Status: Abnormal   Collection Time: 01/25/20  2:01 PM  Result Value Ref Range Status   Enterococcus species NOT DETECTED NOT DETECTED Final   Listeria  monocytogenes NOT DETECTED NOT DETECTED Final   Staphylococcus species NOT DETECTED NOT DETECTED Final   Staphylococcus aureus (BCID) NOT DETECTED NOT DETECTED Final   Streptococcus species NOT DETECTED NOT DETECTED Final   Streptococcus agalactiae NOT DETECTED NOT DETECTED Final   Streptococcus pneumoniae NOT DETECTED NOT DETECTED Final   Streptococcus pyogenes NOT DETECTED NOT DETECTED Final   Acinetobacter baumannii NOT DETECTED NOT DETECTED Final   Enterobacteriaceae species DETECTED (A) NOT DETECTED Final    Comment: Enterobacteriaceae represent a large family of gram-negative bacteria, not a single organism. CRITICAL RESULT CALLED TO, READ BACK BY AND VERIFIED WITH: SCOTT HALL AT 0255 ON 01/26/20 RWW    Enterobacter cloacae complex NOT DETECTED NOT DETECTED Final   Escherichia coli DETECTED (A) NOT DETECTED Final    Comment: CRITICAL RESULT CALLED TO, READ BACK BY AND VERIFIED WITH: SCOTT HALL AT 0255 ON 01/26/20 RWW    Klebsiella oxytoca NOT DETECTED NOT DETECTED Final   Klebsiella pneumoniae NOT DETECTED NOT DETECTED Final   Proteus species NOT DETECTED NOT DETECTED Final   Serratia marcescens NOT DETECTED NOT DETECTED Final   Carbapenem resistance NOT DETECTED NOT DETECTED Final   Haemophilus influenzae NOT DETECTED NOT DETECTED Final   Neisseria meningitidis NOT DETECTED NOT DETECTED Final   Pseudomonas aeruginosa NOT DETECTED NOT DETECTED Final   Candida albicans NOT DETECTED NOT DETECTED Final   Candida glabrata NOT DETECTED NOT DETECTED Final   Candida krusei NOT DETECTED NOT DETECTED Final     Candida parapsilosis NOT DETECTED NOT DETECTED Final   Candida tropicalis NOT DETECTED NOT DETECTED Final    Comment: Performed at [facility], [address] [place], [place] [address]  SARS Coronavirus 2 by RT PCR (hospital order, performed in [facility] hospital lab) Nasopharyngeal Nasopharyngeal Swab     Status: None   Collection Time: 01/25/20  2:06 PM   Specimen: Nasopharyngeal Swab  Result Value Ref Range Status   SARS Coronavirus 2 NEGATIVE NEGATIVE Final    Comment: (NOTE) SARS-CoV-2 target nucleic acids are NOT DETECTED. The SARS-CoV-2 RNA is generally detectable in upper and lower respiratory specimens during the acute phase of infection. The lowest concentration of SARS-CoV-2 viral copies this assay can detect is 250 copies / mL. A negative result does not preclude SARS-CoV-2 infection and should not be used as the sole basis for treatment or other patient management decisions.  A negative result may occur with improper specimen collection / handling, submission of specimen other than nasopharyngeal swab, presence of viral mutation(s) within the areas targeted by this assay, and inadequate number of viral copies (<250 copies / mL). A negative result must be combined with clinical observations, patient history, and epidemiological information. Fact Sheet for Patients:   BoilerBrush.com.cy Fact Sheet for Healthcare Providers: https://pope.com/ This test is not yet approved or cleared  by the [place] FDA and has been authorized for detection and/or diagnosis of SARS-CoV-2 by FDA under an Emergency Use Authorization (EUA).  This EUA will remain in effect (meaning this test can be used) for the duration of the COVID-19 declaration under Section 564(b)(1) of the Act, 21 U.S.C. section 360bbb-3(b)(1), unless the authorization is terminated or revoked sooner. Performed at [facility], [address], [place], [place] [address]   Urine Culture     Status: Abnormal (Preliminary result)   Collection Time: 01/26/20 11:03 AM   Specimen: Urine, Random  Result Value Ref Range Status   Specimen Description   Final  URINE, RANDOM Performed at [facility], [address] [place], [place] [address]    Special Requests   Final    NONE Performed at [facility], [address] [place], [place] [address]    Culture >=100,000 COLONIES/mL GRAM NEGATIVE RODS (A)  Final   Report Status PENDING  Incomplete     Discharge Instructions:   Discharge Instructions    Diet - low sodium heart healthy   Complete by: As directed    Diet Carb Modified   Complete by: As directed    Increase activity slowly   Complete by: As directed      Allergies as of 01/28/2020      Reactions   Penicillins Hives   Other reaction(s): UNKNOWN      Medication List    TAKE these medications   acetaminophen 650 MG CR tablet Commonly known as: TYLENOL Take 650 mg by mouth every 8 (eight) hours as needed for pain.   insulin NPH-regular Human (70-30) 100 UNIT/ML injection Inject 18 Units into the skin in the morning and at bedtime. Start taking on: Jan 29, 2020   levofloxacin 500 MG tablet Commonly known as: LEVAQUIN Take 1 tablet (500 mg total) by mouth daily for 3 days. Start taking on: Jan 29, 2020         Time coordinating discharge: 28 minutes  Signed:  Edwen Mclester  [facility] 01/28/2020, 10:28 AM

## 2020-01-28 NOTE — TOC Transition Note (Signed)
Transition of Care [facility]) - CM/SW Discharge Note   Patient Details  Name: Alexa Dunn MRN: [id number] Date of Birth: 1986/05/06  [facility] CM/SW Contact:  Su Hilt, RN Phone Number: 01/28/2020, 10:36 AM   Clinical Narrative:     Met with the patient and provided her with an [facility] application and explained to her to call to set up an application appointment and then they will set up a doctor appointment as well,m her medication was sent to [facility] and she stated that she could afford it as long as it was under 20$, if it is more than 20$ she will ask [facility] to send the RX to [facility]. To fill No additional needs         Patient Goals and CMS Choice        Discharge Placement                       Discharge Plan and Services                                     Social Determinants of Health (SDOH) Interventions     Readmission Risk Interventions No flowsheet data found.

## 2020-01-28 NOTE — Telephone Encounter (Signed)
We received a referral for pt; I was able to speak with her, confirm her eligibility, and answer any questions she had. She stated she will stop by 5/20 to turn in her application

## 2020-01-29 LAB — URINE CULTURE: Culture: >=100000 — AB

## 2020-02-02 LAB — CULTURE, BLOOD (ROUTINE X 2)
Culture: NO GROWTH
Culture: NO GROWTH
Special Requests: ADEQUATE
Special Requests: ADEQUATE

## 2020-08-06 ENCOUNTER — Emergency Department
Admission: EM | Admit: 2020-08-06 | Discharge: 2020-08-06 | Disposition: A | Discharge status: Home / Self Care | Payer: Self-pay | Attending: Emergency Medicine | Admitting: Emergency Medicine

## 2020-08-06 ENCOUNTER — Encounter: Payer: Self-pay | Admitting: Emergency Medicine

## 2020-08-06 ENCOUNTER — Emergency Department: Payer: Self-pay

## 2020-08-06 ENCOUNTER — Other Ambulatory Visit: Payer: Self-pay

## 2020-08-06 DIAGNOSIS — J45909 Unspecified asthma, uncomplicated: Secondary | ICD-10-CM | POA: Insufficient documentation

## 2020-08-06 DIAGNOSIS — F17200 Nicotine dependence, unspecified, uncomplicated: Secondary | ICD-10-CM | POA: Insufficient documentation

## 2020-08-06 DIAGNOSIS — Z7984 Long term (current) use of oral hypoglycemic drugs: Secondary | ICD-10-CM | POA: Insufficient documentation

## 2020-08-06 DIAGNOSIS — M25511 Pain in right shoulder: Secondary | ICD-10-CM | POA: Insufficient documentation

## 2020-08-06 DIAGNOSIS — E119 Type 2 diabetes mellitus without complications: Secondary | ICD-10-CM | POA: Insufficient documentation

## 2020-08-06 MED ORDER — HYDROCODONE-ACETAMINOPHEN 5-325 MG PO TABS: 1.0000 | ORAL_TABLET | Freq: Four times a day (QID) | ORAL | 0 refills | Status: DC | PRN

## 2020-08-06 MED ORDER — MELOXICAM 15 MG PO TABS
15.0000 mg | ORAL_TABLET | Freq: Every day | ORAL | 2 refills | Status: DC
Start: 2020-08-06 — End: 2021-01-06

## 2020-08-06 MED ORDER — OXYCODONE-ACETAMINOPHEN 5-325 MG PO TABS
1.0000 | ORAL_TABLET | Freq: Once | ORAL | Status: AC
  Administered 2020-08-06: 1 via ORAL
  Filled 2020-08-06: qty 1

## 2020-08-06 NOTE — ED Provider Notes (Signed)
Alexa Dunn Provider Note  ____________________________________________   First MD Initiated Contact with Patient 08/06/20 1215     (approximate)  I have reviewed the triage vital signs and the nursing notes.   HISTORY  Chief Complaint Shoulder Pain    HPI Alexa Dunn is a 34 y.o. female presents emergency Dunn after a fall yesterday.  Patient was going down her back steps for the dog was chained up on the leash and he ran up and she tripped over the leash falling directly on the right shoulder.  No neck injury or head injury.  Patient states she cannot hardly move the right arm to put her shirt on.  No numbness or tingling.    Past Medical History:  Diagnosis Date  . Asthma   . Diabetes mellitus without complication [facility])     Patient Active Problem List   Diagnosis Date Noted  . E coli bacteremia 01/26/2020  . Sepsis (H[facility]) 01/25/2020  . Acute pyelonephritis 01/25/2020  . Diabetes mellitus without complication (H[facility])   . Asthma     Past Surgical History:  Procedure Laterality Date  . CESAREAN SECTION    . TONSILLECTOMY      Prior to Admission medications   Medication Sig Start Date End Date Taking? Authorizing Provider  metFORMIN (GLUCOPHAGE) 1000 MG tablet Take 1,000 mg by mouth 2 (two) times daily with a meal.   Yes [provider]  acetaminophen (TYLENOL) 650 MG CR tablet Take 650 mg by mouth every 8 (eight) hours as needed for pain.    [provider]  HYDROcodone-acetaminophen (NORCO/VICODIN) 5-325 MG tablet Take 1 tablet by mouth every 6 (six) hours as needed for moderate pain. 08/06/20   Jandy Brackens, Roselyn Bering, PA-C  insulin NPH-regular Human (70-30) 100 UNIT/ML injection Inject 18 Units into the skin in the morning and at bedtime. 01/29/20   Lurene Shadow, MD  meloxicam (MOBIC) 15 MG tablet Take 1 tablet (15 mg total) by mouth daily. 08/06/20 08/06/21  Faythe Ghee, PA-C     Allergies Penicillins  History reviewed. No pertinent family history.  Social History Social History   Tobacco Use  . Smoking status: Current Every Day Smoker    Packs/day: 1.00  . Smokeless tobacco: Never Used  Substance Use Topics  . Alcohol use: No  . Drug use: No    Review of Systems  Constitutional: No fever/chills Eyes: No visual changes. ENT: No sore throat. Respiratory: Denies cough Cardiovascular: Denies chest pain Genitourinary: Negative for dysuria. Musculoskeletal: Negative for back pain.  Positive right shoulder pain Skin: Negative for rash. Psychiatric: no mood changes,     ____________________________________________   PHYSICAL EXAM:  VITAL SIGNS: ED Triage Vitals [08/06/20 1137]  Enc Vitals Group     BP (!) 159/82     Pulse Rate 91     Resp 20     Temp 98.9 F (37.2 C)     Temp Source Oral     SpO2 100 %     Weight 211 lb (95.7 kg)     Height 5\' 5"  (1.651 m)     Head Circumference      Peak Flow      Pain Score 10     Pain Loc      Pain Edu?      Excl. in GC?     Constitutional: Alert and oriented. Well appearing and in no acute distress. Eyes: Conjunctivae are normal.  Head: Atraumatic. Nose: No  congestion/rhinnorhea. Mouth/Throat: Mucous membranes are moist.   Neck:  supple no lymphadenopathy noted Cardiovascular: Normal rate, regular rhythm.  Respiratory: Normal respiratory effort.  No retractions, GU: deferred Musculoskeletal: Decreased range of motion of the right shoulder.  Patient has a large amount of swelling noted along the right humerus.  Neurovascular is intact.  Wrist is nontender.  C-spine is nontender  neurologic:  Normal speech and language.  Skin:  Skin is warm, dry and intact. No rash noted. Psychiatric: Mood and affect are normal. Speech and behavior are normal.  ____________________________________________   LABS (all labs ordered are listed, but only abnormal results are displayed)  Labs Reviewed -  No data to display ____________________________________________   ____________________________________________  RADIOLOGY  X-ray of the right shoulder is negative for any acute abnormality X-ray of the right elbow  ____________________________________________   PROCEDURES  Procedure(s) performed: Sling applied by nursing staff Procedures    ____________________________________________   INITIAL IMPRESSION / ASSESSMENT AND PLAN / ED COURSE  Pertinent labs & imaging results that were available during my care of the patient were reviewed by me and considered in my medical decision making (see chart for details).   Patient is 34 year old female presents to the emergency Dunn after a fall yesterday.  See HPI.  Physical exam shows patient to be in decent amount of pain.  Right shoulder is tender from the upper humerus to the elbow.  Neurovascular is intact  X-ray of the right shoulder that was ordered from triage is negative X-ray of the right elbow   X-ray of the right elbow is also negative.  Explained the findings to the patient.  We placed her in a sling for comfort.  Explained that she may need repeat x-ray in 1 week if she is not improving.  She is to follow-up with orthopedics if not improving in 3 to 5 days.  Take meloxicam daily as prescribed.  Vicodin for pain not controlled by meloxicam.  Apply  ice is much as possible.  She states she understands.  She is discharged stable condition.  Alexa Dunn was evaluated in Emergency Dunn on 08/06/2020 for the symptoms described in the history of present illness. She was evaluated in the context of the global COVID-19 pandemic, which necessitated consideration that the patient might be at risk for infection with the SARS-CoV-2 virus that causes COVID-19. Institutional protocols and algorithms that pertain to the evaluation of patients at risk for COVID-19 are in a state of rapid change based on information released by  regulatory bodies including the CDC and federal and state organizations. These policies and algorithms were followed during the patient's care in the ED.    As part of my medical decision making, I reviewed the following data within the electronic MEDICAL RECORD NUMBER Nursing notes reviewed and incorporated, Old chart reviewed, Radiograph reviewed , Notes from prior ED visits and [place] Controlled Substance Database  ____________________________________________   FINAL CLINICAL IMPRESSION(S) / ED DIAGNOSES  Final diagnoses:  Acute pain of right shoulder      NEW MEDICATIONS STARTED DURING THIS VISIT:  New Prescriptions   HYDROCODONE-ACETAMINOPHEN (NORCO/VICODIN) 5-325 MG TABLET    Take 1 tablet by mouth every 6 (six) hours as needed for moderate pain.   MELOXICAM (MOBIC) 15 MG TABLET    Take 1 tablet (15 mg total) by mouth daily.     Note:  This document was prepared using Dragon voice recognition software and may include unintentional dictation errors.    Greig Right  W, PA-C 08/06/20 1330    Minna Antis, MD 08/06/20 [phone]

## 2020-08-06 NOTE — ED Notes (Signed)
Shoulder immobilizer/sling to right arm.

## 2020-08-06 NOTE — Discharge Instructions (Signed)
Follow-up with orthopedics if not improving in 3 to 5 days.  Return emergency department worsening.  You may need a repeat x-ray if you are not better within a week.  Apply ice to the right shoulder and upper arm.  Wear the sling for the next few days for comfort.  Take the medication as prescribed.  Save the narcotic for at night when the pain is the worst.

## 2020-08-06 NOTE — ED Triage Notes (Signed)
Pt to ED via POV with c/o R shoulder pain s/p mechanical fall yesterday, pt states got caught up in dog's leash while walking her dog.

## 2020-08-06 NOTE — ED Notes (Signed)
Says she gottripped by her dog yeserday and she fell on her right arm.  Says history of shoulder problems there, but she is hurting in entire upper arm.   Ice applied.  Good circulation and sensation distally.

## 2020-11-07 ENCOUNTER — Encounter: Payer: Self-pay | Admitting: Emergency Medicine

## 2020-11-07 ENCOUNTER — Other Ambulatory Visit: Payer: Self-pay

## 2020-11-07 ENCOUNTER — Ambulatory Visit
Admission: EM | Admit: 2020-11-07 | Discharge: 2020-11-07 | Disposition: A | Discharge status: Home / Self Care | Payer: 59 | Attending: Emergency Medicine | Admitting: Emergency Medicine

## 2020-11-07 DIAGNOSIS — S025XXB Fracture of tooth (traumatic), initial encounter for open fracture: Secondary | ICD-10-CM | POA: Diagnosis not present

## 2020-11-07 DIAGNOSIS — K047 Periapical abscess without sinus: Secondary | ICD-10-CM

## 2020-11-07 MED ORDER — CLINDAMYCIN HCL 150 MG PO CAPS: 300.0000 mg | ORAL_CAPSULE | Freq: Three times a day (TID) | ORAL | 0 refills | Status: AC

## 2020-11-07 MED ORDER — HYDROCODONE-ACETAMINOPHEN 5-325 MG PO TABS: 1.0000 | ORAL_TABLET | Freq: Four times a day (QID) | ORAL | 0 refills | Status: AC | PRN

## 2020-11-07 NOTE — ED Provider Notes (Signed)
[facility]    CSN: [id number] Arrival date & time: 11/07/20  1219      History   Chief Complaint Chief Complaint  Patient presents with  . Dental Pain    HPI Alexa Dunn is a 35 y.o. female.   HPI   35 year old female here for evaluation of pain in her left lower jaw.  Patient reports that she has several broken teeth in throughout her mouth and she has had abscesses on several occasions.  She is currently experiencing pain on the left lower jaw at the area of the first molar.  Patient reports that typically when she develops the pain and swelling she can press on her jaw and express the pus around the tooth but this time she cannot.  She has been seen for this multiple times and treated for dental abscesses.  The most recent was 1 year ago.  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  Past Medical History:  Diagnosis Date  . Asthma   . Diabetes mellitus without complication [facility])     Patient Active Problem List   Diagnosis Date Noted  . E coli bacteremia 01/26/2020  . Sepsis (HCC) 01/25/2020  . Acute pyelonephritis 01/25/2020  . Diabetes mellitus without complication (HCC)   . Asthma     Past Surgical History:  Procedure Laterality Date  . CESAREAN SECTION    . TONSILLECTOMY      OB History    Gravida  5   Para      Term      Preterm      AB  2   Living  3     SAB  2   IAB      Ectopic      Multiple      Live Births               Home Medications    Prior to Admission medications   Medication Sig Start Date End Date Taking? Authorizing Provider  clindamycin (CLEOCIN) 150 MG capsule Take 2 capsules (300 mg total) by mouth 3 (three) times daily for 10 days. 11/07/20 11/17/20 Yes Becky Augusta, NP  HYDROcodone-acetaminophen (NORCO/VICODIN) 5-325 MG tablet Take 1 tablet by mouth every 6 (six) hours as needed for up to 5 days. 11/07/20 11/12/20 Yes Becky Augusta, NP   acetaminophen (TYLENOL) 650 MG CR tablet Take 650 mg by mouth every 8 (eight) hours as needed for pain.    [provider]  insulin NPH-regular Human (70-30) 100 UNIT/ML injection Inject 18 Units into the skin in the morning and at bedtime. 01/29/20   Lurene Shadow, MD  meloxicam (MOBIC) 15 MG tablet Take 1 tablet (15 mg total) by mouth daily. 08/06/20 08/06/21  Sherrie Mustache Roselyn Bering, PA-C  metFORMIN (GLUCOPHAGE) 1000 MG tablet Take 1,000 mg by mouth 2 (two) times daily with a meal.    [provider]    Family History History reviewed. No pertinent family history.  Social History Social History   Tobacco Use  . Smoking status: Current Every Day Smoker    Packs/day: 1.00  . Smokeless tobacco: Never Used  Substance Use Topics  . Alcohol use: No  . Drug use: No     Allergies   Penicillins   Review of Systems Review of Systems  Constitutional: Negative for fever.  HENT: Positive for dental problem. Negative for sore throat.  Physical Exam Triage Vital Signs ED Triage Vitals  Enc Vitals Group     BP 11/07/20 1234 (S) (!) 159/83     Pulse Rate 11/07/20 1234 83     Resp 11/07/20 1234 16     Temp 11/07/20 1234 97.9 F (36.6 C)     Temp src --      SpO2 11/07/20 1234 98 %     Weight 11/07/20 1233 198 lb (89.8 kg)     Height --      Head Circumference --      Peak Flow --      Pain Score 11/07/20 1232 7     Pain Loc --      Pain Edu? --      Excl. in GC? --    No data found.  Updated Vital Signs BP (S) (!) 159/83 (BP Location: Right Arm)   Pulse 83   Temp 97.9 F (36.6 C)   Resp 16   Wt 198 lb (89.8 kg)   LMP 10/31/2020   SpO2 98%   BMI 32.95 kg/m   Visual Acuity Right Eye Distance:   Left Eye Distance:   Bilateral Distance:    Right Eye Near:   Left Eye Near:    Bilateral Near:     Physical Exam Vitals and nursing note reviewed.  Constitutional:      General: She is not in acute distress.    Appearance: Normal appearance. She  is not ill-appearing.  HENT:     Head: Normocephalic and atraumatic.     Mouth/Throat:     Mouth: Mucous membranes are moist.     Pharynx: Posterior oropharyngeal erythema present.  Skin:    General: Skin is warm and dry.     Capillary Refill: Capillary refill takes less than 2 seconds.     Findings: No erythema or rash.  Neurological:     General: No focal deficit present.     Mental Status: She is alert and oriented to person, place, and time.  Psychiatric:        Mood and Affect: Mood normal.        Behavior: Behavior normal.        Thought Content: Thought content normal.        Judgment: Judgment normal.      UC Treatments / Results  Labs (all labs ordered are listed, but only abnormal results are displayed) Labs Reviewed - No data to display  EKG   Radiology No results found.  Procedures Procedures (including critical care time)  Medications Ordered in UC Medications - No data to display  Initial Impression / Assessment and Plan / UC Course  I have reviewed the triage vital signs and the nursing notes.  Pertinent labs & imaging results that were available during my care of the patient were reviewed by me and considered in my medical decision making (see chart for details).   Patient very pleasant 35 year old female here for evaluation of left-sided dental pain.  Physical exam reveals multiple broken teeth throughout the mouth but the current area of concern is a swollen and erythematous gum tissue around the area of the first or second molar.  The patient's molar is broken off flat with the gumline and is black.  There is no discharge coming around the remaining aspect of the tooth.  There is no bogginess to the floor of the mouth and there is no fluctuance when palpating along the gumline.  Patient does have some  mild external swelling.  Patient does have an upcoming dental appointment.  Will cover with Augmentin for suspected abscess formation, will give Norco for  pain, have patient keep her appointment in May but have advised her to see if she can get on a call list in case they have something open up sooner.  We will also give patient contact information for [facility] of dentistry.   Final Clinical Impressions(s) / UC Diagnoses   Final diagnoses:  Dental abscess  Open fracture of tooth, initial encounter     Discharge Instructions     Take the clindamycin 3 times a day with food for 10 days for treatment of your dental infection.  Take an over-the-counter probiotic 1 hour after each dose of antibiotic to prevent diarrhea from occurring.  Use the Norco every 6 hours as needed for severe pain and take ibuprofen 600 mg every 6 hours with food as needed for mild to moderate pain.  Rinse your mouth with warm salt water after meals and at bedtime.  Call your dentist office and see if you can get on a call list for a sooner appointment.  If you have any increase in your pain or cannot get a sooner appointment you can always access the [facility] school of dentistry listed below:  [facility] [phone] This clinic caters to the indigent population and is on a lottery system.  Location: [employer] [facility], [employer], [address], [place] Clinic Hours:  Wednesdays from 6pm 9pm, patients seen by a lottery system. For dates, call or go to [email or web address] Services:  Cleanings, fillings and simple extractions. Payment Options:  DENTAL WORK IS FREE OF CHARGE. Bring proof of income or support. Best way to get seen:  Arrive at 5:15 pm this is a lottery, NOT first come/first serve, so arriving earlier will not increase your chances of being seen.  [facility] [phone] Select option 1 for emergencies  Location:  [facility], [place], [address], [place] Clinic Hours:  No walk-ins accepted call the day before to schedule an appointment. Check in  times are 9:30 am and 1:30 pm. Services:  Simple extractions, temporary fillings, pulpectomy/pulp debridement, uncomplicated abscess drainage. Payment Options: PAYMENT IS DUE AT THE TIME OF SERVICE. Fee is usually $100-200, additional surgical procedures (e.g. abscess drainage) may be extra. Cash, checks, Visa/MasterCard accepted. Can file Medicaid if patient is covered for dental patient should call case worker to check. No discount for [facility] patients. Best way to get seen:  MUST call the day before and get onto the schedule. Can usually be seen the next 1-2 days. No walk-ins accepted.      ED Prescriptions    Medication Sig Dispense Auth. Provider   clindamycin (CLEOCIN) 150 MG capsule Take 2 capsules (300 mg total) by mouth 3 (three) times daily for 10 days. 60 capsule Becky Augusta, NP   HYDROcodone-acetaminophen (NORCO/VICODIN) 5-325 MG tablet Take 1 tablet by mouth every 6 (six) hours as needed for up to 5 days. 20 tablet Becky Augusta, NP     I have reviewed the PDMP during this encounter.   Becky Augusta, NP 11/07/20 1301

## 2020-11-07 NOTE — Discharge Instructions (Addendum)
Take the clindamycin 3 times a day with food for 10 days for treatment of your dental infection.  Take an over-the-counter probiotic 1 hour after each dose of antibiotic to prevent diarrhea from occurring.  Use the Norco every 6 hours as needed for severe pain and take ibuprofen 600 mg every 6 hours with food as needed for mild to moderate pain.  Rinse your mouth with warm salt water after meals and at bedtime.  Call your dentist office and see if you can get on a call list for a sooner appointment.  If you have any increase in your pain or cannot get a sooner appointment you can always access the [facility] school of dentistry listed below:  [facility] [phone] This clinic caters to the indigent population and is on a lottery system.  Location: [employer] [facility], [employer], [address], [place] Clinic Hours:  Wednesdays from 6pm 9pm, patients seen by a lottery system. For dates, call or go to [email or web address] Services:  Cleanings, fillings and simple extractions. Payment Options:  DENTAL WORK IS FREE OF CHARGE. Bring proof of income or support. Best way to get seen:  Arrive at 5:15 pm this is a lottery, NOT first come/first serve, so arriving earlier will not increase your chances of being seen.  [facility] [phone] Select option 1 for emergencies  Location:  [facility], [place], [address] [place] Clinic Hours:  No walk-ins accepted call the day before to schedule an appointment. Check in times are 9:30 am and 1:30 pm. Services:  Simple extractions, temporary fillings, pulpectomy/pulp debridement, uncomplicated abscess drainage. Payment Options: PAYMENT IS DUE AT THE TIME OF SERVICE. Fee is usually $100-200, additional surgical procedures (e.g. abscess drainage) may be extra. Cash, checks, Visa/MasterCard accepted. Can file Medicaid if patient is covered for dental patient  should call case worker to check. No discount for [facility] patients. Best way to get seen:  MUST call the day before and get onto the schedule. Can usually be seen the next 1-2 days. No walk-ins accepted.

## 2020-11-07 NOTE — ED Triage Notes (Addendum)
Patient presents to Urgent Care with complaints of left lower jaw pain x a couple days ago. Pt reports hx of dental abscesses. Treating pain with BC powder last dose this morning at 1000.   Denies fever.

## 2020-12-01 ENCOUNTER — Other Ambulatory Visit: Payer: Self-pay

## 2020-12-01 ENCOUNTER — Encounter: Payer: Self-pay | Admitting: Emergency Medicine

## 2020-12-01 ENCOUNTER — Ambulatory Visit
Admission: EM | Admit: 2020-12-01 | Discharge: 2020-12-01 | Disposition: A | Discharge status: Home / Self Care | Payer: Self-pay | Attending: Physician Assistant | Admitting: Physician Assistant

## 2020-12-01 ENCOUNTER — Ambulatory Visit (INDEPENDENT_AMBULATORY_CARE_PROVIDER_SITE_OTHER): Payer: Self-pay

## 2020-12-01 DIAGNOSIS — S46912A Strain of unspecified muscle, fascia and tendon at shoulder and upper arm level, left arm, initial encounter: Secondary | ICD-10-CM

## 2020-12-01 DIAGNOSIS — M25512 Pain in left shoulder: Secondary | ICD-10-CM

## 2020-12-01 HISTORY — DX: Essential (primary) hypertension: I10

## 2020-12-01 MED ORDER — KETOROLAC TROMETHAMINE 60 MG/2ML IM SOLN
60.0000 mg | Freq: Once | INTRAMUSCULAR | Status: AC
  Administered 2020-12-01: 60 mg via INTRAMUSCULAR

## 2020-12-01 MED ORDER — KETOROLAC TROMETHAMINE 10 MG PO TABS: 10.0000 mg | ORAL_TABLET | Freq: Four times a day (QID) | ORAL | 0 refills | Status: AC | PRN

## 2020-12-01 MED ORDER — CYCLOBENZAPRINE HCL 10 MG PO TABS: 10.0000 mg | ORAL_TABLET | Freq: Three times a day (TID) | ORAL | 0 refills | Status: AC | PRN

## 2020-12-01 NOTE — Discharge Instructions (Addendum)
SPRAIN: X-rays normal. Stressed avoiding painful activities . Reviewed RICE guidelines. Use medications as directed, including NSAIDs. If no NSAIDs have been prescribed for you today, you may take Aleve or Motrin over the counter. May use Tylenol in between doses of NSAIDs.  If no improvement in the next 1-2 weeks, f/u with PCP or return to our office for reexamination, and please feel free to call or return at any time for any questions or concerns you may have and we will be happy to help you!      USE THE GOODRX APP FOR THE MEDICATIONS IF YOU DO NOT HAVE INSURANCE TO COVER COSTS.

## 2020-12-01 NOTE — ED Triage Notes (Signed)
Patient in today c/o left shoulder pain x 5 days. Patient denies any injury, but states she can't raise her arm above her head or sleep on her left side. Patient has taken OTC Tylenol, Ibuprofen and BC Powders without relief.

## 2020-12-01 NOTE — ED Provider Notes (Signed)
[facility]    CSN: [id number] Arrival date & time: 12/01/20  1425      History   Chief Complaint Chief Complaint  Patient presents with  . Shoulder Pain    left    HPI Alexa Dunn is a 35 y.o. female presenting for left shoulder pain for about 5 days.  Patient denies any specific injury and states that she just noticed it gradually getting worse.  Patient works in [employer] at [employer].  She says she has worked 8 days in a row. She denies any recent changes in her job duties.  No change in her general activity either.  Patient says her pain is of the left upper arm and left posterior shoulder.  It is constant and worse with raising the arm above her shoulder.  Pain improves a little bit when she holds her arm to her side.  She says she is taken ibuprofen, Tylenol and BC powder all without any improvement in her symptoms.  She denies any associated numbness/tingling/weakness, redness, warmth or swelling, chest pain or breathing difficulty.  Has not had any back or neck pain.  Denies any similar problems in the past.  Never had any surgery on the shoulder.  Past medical history significant for asthma, diabetes, and hypertension.  No other concerns.  HPI  Past Medical History:  Diagnosis Date  . Asthma   . Diabetes mellitus without complication (HCC)   . Hypertension     Patient Active Problem List   Diagnosis Date Noted  . E coli bacteremia 01/26/2020  . Sepsis (HCC) 01/25/2020  . Acute pyelonephritis 01/25/2020  . Diabetes mellitus without complication (HCC)   . Asthma     Past Surgical History:  Procedure Laterality Date  . CESAREAN SECTION    . TONSILLECTOMY      OB History    Gravida  5   Para      Term      Preterm      AB  2   Living  3     SAB  2   IAB      Ectopic      Multiple      Live Births               Home Medications    Prior to Admission medications   Medication Sig Start Date End Date Taking? Authorizing  Provider  acetaminophen (TYLENOL) 650 MG CR tablet Take 650 mg by mouth every 8 (eight) hours as needed for pain.   Yes [provider]  cyclobenzaprine (FLEXERIL) 10 MG tablet Take 1 tablet (10 mg total) by mouth 3 (three) times daily as needed for up to 7 days for muscle spasms. 12/01/20 12/08/20 Yes Shirlee LatchEaves, Nickolus Wadding B, PA-C  insulin NPH-regular Human (70-30) 100 UNIT/ML injection Inject 18 Units into the skin in the morning and at bedtime. 01/29/20  Yes Lurene ShadowAyiku, Bernard, MD  ketorolac (TORADOL) 10 MG tablet Take 1 tablet (10 mg total) by mouth every 6 (six) hours as needed for up to 5 days. 12/01/20 12/06/20 Yes Shirlee LatchEaves, Willard Madrigal B, PA-C  metFORMIN (GLUCOPHAGE) 1000 MG tablet Take 1,000 mg by mouth 2 (two) times daily with a meal.   Yes [provider]  meloxicam (MOBIC) 15 MG tablet Take 1 tablet (15 mg total) by mouth daily. 08/06/20 08/06/21  Faythe GheeFisher, Susan W, PA-C    Family History Family History  Problem Relation Age of Onset  . Healthy Mother   .  Diabetes Father     Social History Social History   Tobacco Use  . Smoking status: Current Every Day Smoker    Packs/day: 0.50    Years: 15.00    Pack years: 7.50  . Smokeless tobacco: Never Used  Vaping Use  . Vaping Use: Never used  Substance Use Topics  . Alcohol use: No  . Drug use: No     Allergies   Penicillins   Review of Systems Review of Systems  Constitutional: Negative for fatigue and fever.  Respiratory: Negative for shortness of breath.   Cardiovascular: Negative for chest pain and palpitations.  Musculoskeletal: Positive for arthralgias. Negative for back pain, neck pain and neck stiffness.  Skin: Negative for color change, rash and wound.  Neurological: Negative for dizziness, weakness, numbness and headaches.     Physical Exam Triage Vital Signs ED Triage Vitals  Enc Vitals Group     BP 12/01/20 1433 (!) 171/95     Pulse Rate 12/01/20 1433 90     Resp 12/01/20 1433 18     Temp 12/01/20 1433  98.2 F (36.8 C)     Temp Source 12/01/20 1433 Oral     SpO2 12/01/20 1433 100 %     Weight 12/01/20 1432 195 lb (88.5 kg)     Height 12/01/20 1432 5\' 5"  (1.651 m)     Head Circumference --      Peak Flow --      Pain Score 12/01/20 1432 10     Pain Loc --      Pain Edu? --      Excl. in GC? --    No data found.  Updated Vital Signs BP (!) 171/95 (BP Location: Left Arm)   Pulse 90   Temp 98.2 F (36.8 C) (Oral)   Resp 18   Ht 5\' 5"  (1.651 m)   Wt 195 lb (88.5 kg)   LMP 11/29/2020 (Exact Date)   SpO2 100%   BMI 32.45 kg/m       Physical Exam Vitals and nursing note reviewed.  Constitutional:      General: She is not in acute distress.    Appearance: Normal appearance. She is not ill-appearing or toxic-appearing.  HENT:     Head: Normocephalic and atraumatic.  Eyes:     General: No scleral icterus.       Right eye: No discharge.        Left eye: No discharge.     Conjunctiva/sclera: Conjunctivae normal.  Cardiovascular:     Rate and Rhythm: Normal rate and regular rhythm.     Pulses: Normal pulses.     Heart sounds: Normal heart sounds.  Pulmonary:     Effort: Pulmonary effort is normal. No respiratory distress.     Breath sounds: Normal breath sounds. No wheezing, rhonchi or rales.  Musculoskeletal:     Left shoulder: Tenderness present. No swelling or deformity. Bony tenderness: lateral deltoid, posterior scapula. Decreased range of motion (decreased flexion and abduction beyond 90 degrees). Normal strength. Normal pulse.     Cervical back: Neck supple.  Skin:    General: Skin is dry.  Neurological:     General: No focal deficit present.     Mental Status: She is alert. Mental status is at baseline.     Motor: No weakness.     Gait: Gait normal.  Psychiatric:        Mood and Affect: Mood normal.  Behavior: Behavior normal.        Thought Content: Thought content normal.      UC Treatments / Results  Labs (all labs ordered are listed, but only  abnormal results are displayed) Labs Reviewed - No data to display  EKG   Radiology DG Shoulder Left  Result Date: 12/01/2020 CLINICAL DATA:  Pain with less pitted range of motion EXAM: LEFT SHOULDER - 2+ VIEW COMPARISON:  None. FINDINGS: Frontal, oblique, and Y scapular images were obtained. No fracture or dislocation. There is no appreciable joint space narrowing or erosion. Visualized left lung clear. IMPRESSION: No fracture or dislocation.  No appreciable arthropathic change. Electronically Signed   By: Bretta Bang III M.D.   On: 12/01/2020 15:31    Procedures Procedures (including critical care time)  Medications Ordered in UC Medications  ketorolac (TORADOL) injection 60 mg (60 mg Intramuscular Given 12/01/20 1534)    Initial Impression / Assessment and Plan / UC Course  I have reviewed the triage vital signs and the nursing notes.  Pertinent labs & imaging results that were available during my care of the patient were reviewed by me and considered in my medical decision making (see chart for details).   35 year old female presenting for left shoulder pain not due to injury for about 5 days.  Does have limited range of motion due to pain.  X-rays obtained today which are within normal limits.  No evidence of any degenerative changes, fractures or dislocations.  Reviewed results with patient.  Advised her she could have strained or sprained the shoulder and that could be why she is having the symptoms.  Encourage supportive care.  Patient was given ketorolac 60 mg IM in clinic and I prescribed the oral form of this medication for her and also advised her she can take Tylenol for pain relief.  Prescribed cyclobenzaprine as well because she seems to be having some muscle spasms of the shoulder, especially the posterior scapula.  Encouraged stretching and moving the shoulder around once the pain starts to improve over the next couple of days.  She has been provided a shoulder  sling to use as needed for comfort.  Work note was provided for the next couple of days.  Advised her if she is not getting better within the next week or 2 that she needs to follow-up with orthopedics.  Suggested [facility] in [place].  She knows she can follow-up with our department as needed.  Final Clinical Impressions(s) / UC Diagnoses   Final diagnoses:  Strain of left shoulder, initial encounter  Acute pain of left shoulder     Discharge Instructions     SPRAIN: X-rays normal. Stressed avoiding painful activities . Reviewed RICE guidelines. Use medications as directed, including NSAIDs. If no NSAIDs have been prescribed for you today, you may take Aleve or Motrin over the counter. May use Tylenol in between doses of NSAIDs.  If no improvement in the next 1-2 weeks, f/u with PCP or return to our office for reexamination, and please feel free to call or return at any time for any questions or concerns you may have and we will be happy to help you!      USE THE GOODRX APP FOR THE MEDICATIONS IF YOU DO NOT HAVE INSURANCE TO COVER COSTS.       ED Prescriptions    Medication Sig Dispense Auth. Provider   ketorolac (TORADOL) 10 MG tablet Take 1 tablet (10 mg total) by mouth every 6 (six) hours  as needed for up to 5 days. 20 tablet Eusebio Friendly B, PA-C   cyclobenzaprine (FLEXERIL) 10 MG tablet Take 1 tablet (10 mg total) by mouth 3 (three) times daily as needed for up to 7 days for muscle spasms. 20 tablet Shirlee Latch, PA-C     I have reviewed the PDMP during this encounter.   Shirlee Latch, PA-C 12/01/20 1555

## 2020-12-22 ENCOUNTER — Other Ambulatory Visit: Payer: Self-pay

## 2020-12-22 ENCOUNTER — Encounter: Payer: Self-pay | Admitting: Gerontology

## 2020-12-22 ENCOUNTER — Ambulatory Visit: Payer: Self-pay | Admitting: Gerontology

## 2020-12-22 VITALS — BP 133/85 | HR 98 | Temp 98.0°F | Resp 16 | Ht 65.0 in | Wt 200.3 lb

## 2020-12-22 DIAGNOSIS — R5383 Other fatigue: Secondary | ICD-10-CM

## 2020-12-22 DIAGNOSIS — G8929 Other chronic pain: Secondary | ICD-10-CM

## 2020-12-22 DIAGNOSIS — I10 Essential (primary) hypertension: Secondary | ICD-10-CM

## 2020-12-22 DIAGNOSIS — Z3009 Encounter for other general counseling and advice on contraception: Secondary | ICD-10-CM

## 2020-12-22 DIAGNOSIS — J4520 Mild intermittent asthma, uncomplicated: Secondary | ICD-10-CM

## 2020-12-22 DIAGNOSIS — E1165 Type 2 diabetes mellitus with hyperglycemia: Secondary | ICD-10-CM

## 2020-12-22 DIAGNOSIS — Z7689 Persons encountering health services in other specified circumstances: Secondary | ICD-10-CM

## 2020-12-22 DIAGNOSIS — M25511 Pain in right shoulder: Secondary | ICD-10-CM | POA: Insufficient documentation

## 2020-12-22 DIAGNOSIS — M25512 Pain in left shoulder: Secondary | ICD-10-CM | POA: Insufficient documentation

## 2020-12-22 LAB — POCT GLYCOSYLATED HEMOGLOBIN (HGB A1C): Hemoglobin A1C: 9.6 % — AB (ref 4.0–5.6)

## 2020-12-22 MED ORDER — RIGHTEST GS550 BLOOD GLUCOSE VI STRP
ORAL_STRIP | 11 refills | Status: DC
  Filled 2020-12-22: qty 100, 30d supply, fill #0

## 2020-12-22 MED ORDER — BLOOD GLUCOSE MONITOR KIT
PACK | 0 refills | Status: DC
  Filled 2020-12-22: qty 1, fill #0

## 2020-12-22 MED ORDER — BASAGLAR KWIKPEN 100 UNIT/ML ~~LOC~~ SOPN
10.0000 [IU] | PEN_INJECTOR | Freq: Every day | SUBCUTANEOUS | 5 refills | Status: DC
  Filled 2020-12-22: qty 15, 140d supply, fill #0

## 2020-12-22 MED ORDER — METFORMIN HCL 500 MG PO TABS
500.0000 mg | ORAL_TABLET | Freq: Every day | ORAL | 0 refills | Status: DC
  Filled 2020-12-22: qty 30, 30d supply, fill #0

## 2020-12-22 MED ORDER — ALBUTEROL SULFATE HFA 108 (90 BASE) MCG/ACT IN AERS
2.0000 | INHALATION_SPRAY | Freq: Four times a day (QID) | RESPIRATORY_TRACT | 2 refills | Status: DC | PRN
  Filled 2020-12-22: qty 6.7, 25d supply, fill #0

## 2020-12-22 MED ORDER — BLOOD GLUCOSE MONITOR SYSTEM W/DEVICE KIT
PACK | 1 refills | Status: DC
  Filled 2020-12-22: qty 1, 30d supply, fill #0

## 2020-12-22 MED ORDER — RIGHTEST GL300 LANCETS MISC
11 refills | Status: DC
  Filled 2020-12-22: qty 100, 30d supply, fill #0

## 2020-12-22 MED ORDER — COMFORT EZ PEN NEEDLES 32G X 4 MM MISC
11 refills | Status: DC
  Filled 2020-12-22: qty 100, 30d supply, fill #0

## 2020-12-22 NOTE — Patient Instructions (Signed)
https://www.nhlbi.nih.gov/files/docs/public/heart/dash_brief.pdf">  DASH Eating Plan DASH stands for Dietary Approaches to Stop Hypertension. The DASH eating plan is a healthy eating plan that has been shown to:  Reduce high blood pressure (hypertension).  Reduce your risk for type 2 diabetes, heart disease, and stroke.  Help with weight loss. What are tips for following this plan? Reading food labels  Check food labels for the amount of salt (sodium) per serving. Choose foods with less than 5 percent of the Daily Value of sodium. Generally, foods with less than 300 milligrams (mg) of sodium per serving fit into this eating plan.  To find whole grains, look for the word "whole" as the first word in the ingredient list. Shopping  Buy products labeled as "low-sodium" or "no salt added."  Buy fresh foods. Avoid canned foods and pre-made or frozen meals. Cooking  Avoid adding salt when cooking. Use salt-free seasonings or herbs instead of table salt or sea salt. Check with your health care provider or pharmacist before using salt substitutes.  Do not fry foods. Cook foods using healthy methods such as baking, boiling, grilling, roasting, and broiling instead.  Cook with heart-healthy oils, such as olive, canola, avocado, soybean, or sunflower oil. Meal planning  Eat a balanced diet that includes: ? 4 or more servings of fruits and 4 or more servings of vegetables each day. Try to fill one-half of your plate with fruits and vegetables. ? 6-8 servings of whole grains each day. ? Less than 6 oz (170 g) of lean meat, poultry, or fish each day. A 3-oz (85-g) serving of meat is about the same size as a deck of cards. One egg equals 1 oz (28 g). ? 2-3 servings of low-fat dairy each day. One serving is 1 cup (237 mL). ? 1 serving of nuts, seeds, or beans 5 times each week. ? 2-3 servings of heart-healthy fats. Healthy fats called omega-3 fatty acids are found in foods such as walnuts,  flaxseeds, fortified milks, and eggs. These fats are also found in cold-water fish, such as sardines, salmon, and mackerel.  Limit how much you eat of: ? Canned or prepackaged foods. ? Food that is high in trans fat, such as some fried foods. ? Food that is high in saturated fat, such as fatty meat. ? Desserts and other sweets, sugary drinks, and other foods with added sugar. ? Full-fat dairy products.  Do not salt foods before eating.  Do not eat more than 4 egg yolks a week.  Try to eat at least 2 vegetarian meals a week.  Eat more home-cooked food and less restaurant, buffet, and fast food.   Lifestyle  When eating at a restaurant, ask that your food be prepared with less salt or no salt, if possible.  If you drink alcohol: ? Limit how much you use to:  0-1 drink a day for women who are not pregnant.  0-2 drinks a day for men. ? Be aware of how much alcohol is in your drink. In the [place] one drink equals one 12 oz bottle of beer (355 mL), one 5 oz glass of wine (148 mL), or one 1 oz glass of hard liquor (44 mL). General information  Avoid eating more than 2,300 mg of salt a day. If you have hypertension, you may need to reduce your sodium intake to 1,500 mg a day.  Work with your health care provider to maintain a healthy body weight or to lose weight. Ask what an ideal weight is for   you.  Get at least 30 minutes of exercise that causes your heart to beat faster (aerobic exercise) most days of the week. Activities may include walking, swimming, or biking.  Work with your health care provider or dietitian to adjust your eating plan to your individual calorie needs. What foods should I eat? Fruits All fresh, dried, or frozen fruit. Canned fruit in natural juice (without added sugar). Vegetables Fresh or frozen vegetables (raw, steamed, roasted, or grilled). Low-sodium or reduced-sodium tomato and vegetable juice. Low-sodium or reduced-sodium tomato sauce and tomato paste.  Low-sodium or reduced-sodium canned vegetables. Grains Whole-grain or whole-wheat bread. Whole-grain or whole-wheat pasta. Brown rice. Oatmeal. Quinoa. Bulgur. Whole-grain and low-sodium cereals. Pita bread. Low-fat, low-sodium crackers. Whole-wheat flour tortillas. Meats and other proteins Skinless chicken or turkey. Ground chicken or turkey. Pork with fat trimmed off. Fish and seafood. Egg whites. Dried beans, peas, or lentils. Unsalted nuts, nut butters, and seeds. Unsalted canned beans. Lean cuts of beef with fat trimmed off. Low-sodium, lean precooked or cured meat, such as sausages or meat loaves. Dairy Low-fat (1%) or fat-free (skim) milk. Reduced-fat, low-fat, or fat-free cheeses. Nonfat, low-sodium ricotta or cottage cheese. Low-fat or nonfat yogurt. Low-fat, low-sodium cheese. Fats and oils Soft margarine without trans fats. Vegetable oil. Reduced-fat, low-fat, or light mayonnaise and salad dressings (reduced-sodium). Canola, safflower, olive, avocado, soybean, and sunflower oils. Avocado. Seasonings and condiments Herbs. Spices. Seasoning mixes without salt. Other foods Unsalted popcorn and pretzels. Fat-free sweets. The items listed above may not be a complete list of foods and beverages you can eat. Contact a dietitian for more information. What foods should I avoid? Fruits Canned fruit in a light or heavy syrup. Fried fruit. Fruit in cream or butter sauce. Vegetables Creamed or fried vegetables. Vegetables in a cheese sauce. Regular canned vegetables (not low-sodium or reduced-sodium). Regular canned tomato sauce and paste (not low-sodium or reduced-sodium). Regular tomato and vegetable juice (not low-sodium or reduced-sodium). Pickles. Olives. Grains Baked goods made with fat, such as croissants, muffins, or some breads. Dry pasta or rice meal packs. Meats and other proteins Fatty cuts of meat. Ribs. Fried meat. Bacon. Bologna, salami, and other precooked or cured meats, such as  sausages or meat loaves. Fat from the back of a pig (fatback). Bratwurst. Salted nuts and seeds. Canned beans with added salt. Canned or smoked fish. Whole eggs or egg yolks. Chicken or turkey with skin. Dairy Whole or 2% milk, cream, and half-and-half. Whole or full-fat cream cheese. Whole-fat or sweetened yogurt. Full-fat cheese. Nondairy creamers. Whipped toppings. Processed cheese and cheese spreads. Fats and oils Butter. Stick margarine. Lard. Shortening. Ghee. Bacon fat. Tropical oils, such as coconut, palm kernel, or palm oil. Seasonings and condiments Onion salt, garlic salt, seasoned salt, table salt, and sea salt. Worcestershire sauce. Tartar sauce. Barbecue sauce. Teriyaki sauce. Soy sauce, including reduced-sodium. Steak sauce. Canned and packaged gravies. Fish sauce. Oyster sauce. Cocktail sauce. Store-bought horseradish. Ketchup. Mustard. Meat flavorings and tenderizers. Bouillon cubes. Hot sauces. Pre-made or packaged marinades. Pre-made or packaged taco seasonings. Relishes. Regular salad dressings. Other foods Salted popcorn and pretzels. The items listed above may not be a complete list of foods and beverages you should avoid. Contact a dietitian for more information. Where to find more information  National Heart, Lung, and Blood Institute: www.nhlbi.nih.gov  American Heart Association: www.heart.org  Academy of Nutrition and Dietetics: www.eatright.org  National Kidney Foundation: www.kidney.org Summary  The DASH eating plan is a healthy eating plan that has been shown to   reduce high blood pressure (hypertension). It may also reduce your risk for type 2 diabetes, heart disease, and stroke.  When on the DASH eating plan, aim to eat more fresh fruits and vegetables, whole grains, lean proteins, low-fat dairy, and heart-healthy fats.  With the DASH eating plan, you should limit salt (sodium) intake to 2,300 mg a day. If you have hypertension, you may need to reduce your  sodium intake to 1,500 mg a day.  Work with your health care provider or dietitian to adjust your eating plan to your individual calorie needs. This information is not intended to replace advice given to you by your health care provider. Make sure you discuss any questions you have with your health care provider. Document Revised: 08/01/2019 Document Reviewed: 08/01/2019 Elsevier Patient Education  2021 Elsevier Inc. https://www.diabeteseducator.org/docs/default-source/living-with-diabetes/conquering-the-grocery-store-v1.pdf?sfvrsn=4">  Carbohydrate Counting for Diabetes Mellitus, Adult Carbohydrate counting is a method of keeping track of how many carbohydrates you eat. Eating carbohydrates naturally increases the amount of sugar (glucose) in the blood. Counting how many carbohydrates you eat improves your blood glucose control, which helps you manage your diabetes. It is important to know how many carbohydrates you can safely have in each meal. This is different for every person. A dietitian can help you make a meal plan and calculate how many carbohydrates you should have at each meal and snack. What foods contain carbohydrates? Carbohydrates are found in the following foods:  Grains, such as breads and cereals.  Dried beans and soy products.  Starchy vegetables, such as potatoes, peas, and corn.  Fruit and fruit juices.  Milk and yogurt.  Sweets and snack foods, such as cake, cookies, candy, chips, and soft drinks.   How do I count carbohydrates in foods? There are two ways to count carbohydrates in food. You can read food labels or learn standard serving sizes of foods. You can use either of the methods or a combination of both. Using the Nutrition Facts label The Nutrition Facts list is included on the labels of almost all packaged foods and beverages in the [place] It includes:  The serving size.  Information about nutrients in each serving, including the grams (g) of carbohydrate  per serving. To use the Nutrition Facts:  Decide how many servings you will have.  Multiply the number of servings by the number of carbohydrates per serving.  The resulting number is the total amount of carbohydrates that you will be having. Learning the standard serving sizes of foods When you eat carbohydrate foods that are not packaged or do not include Nutrition Facts on the label, you need to measure the servings in order to count the amount of carbohydrates.  Measure the foods that you will eat with a food scale or measuring cup, if needed.  Decide how many standard-size servings you will eat.  Multiply the number of servings by 15. For foods that contain carbohydrates, one serving equals 15 g of carbohydrates. ? For example, if you eat 2 cups or 10 oz (300 g) of strawberries, you will have eaten 2 servings and 30 g of carbohydrates (2 servings x 15 g = 30 g).  For foods that have more than one food mixed, such as soups and casseroles, you must count the carbohydrates in each food that is included. The following list contains standard serving sizes of common carbohydrate-rich foods. Each of these servings has about 15 g of carbohydrates:  1 slice of bread.  1 six-inch (15 cm) tortilla.  ? cup or 2   oz (53 g) cooked rice or pasta.   cup or 3 oz (85 g) cooked or canned, drained and rinsed beans or lentils.   cup or 3 oz (85 g) starchy vegetable, such as peas, corn, or squash.   cup or 4 oz (120 g) hot cereal.   cup or 3 oz (85 g) boiled or mashed potatoes, or  or 3 oz (85 g) of a large baked potato.   cup or 4 fl oz (118 mL) fruit juice.  1 cup or 8 fl oz (237 mL) milk.  1 small or 4 oz (106 g) apple.   or 2 oz (63 g) of a medium banana.  1 cup or 5 oz (150 g) strawberries.  3 cups or 1 oz (24 g) popped popcorn. What is an example of carbohydrate counting? To calculate the number of carbohydrates in this sample meal, follow the steps shown below. Sample  meal  3 oz (85 g) chicken breast.  ? cup or 4 oz (106 g) brown rice.   cup or 3 oz (85 g) corn.  1 cup or 8 fl oz (237 mL) milk.  1 cup or 5 oz (150 g) strawberries with sugar-free whipped topping. Carbohydrate calculation 1. Identify the foods that contain carbohydrates: ? Rice. ? Corn. ? Milk. ? Strawberries. 2. Calculate how many servings you have of each food: ? 2 servings rice. ? 1 serving corn. ? 1 serving milk. ? 1 serving strawberries. 3. Multiply each number of servings by 15 g: ? 2 servings rice x 15 g = 30 g. ? 1 serving corn x 15 g = 15 g. ? 1 serving milk x 15 g = 15 g. ? 1 serving strawberries x 15 g = 15 g. 4. Add together all of the amounts to find the total grams of carbohydrates eaten: ? 30 g + 15 g + 15 g + 15 g = 75 g of carbohydrates total. What are tips for following this plan? Shopping  Develop a meal plan and then make a shopping list.  Buy fresh and frozen vegetables, fresh and frozen fruit, dairy, eggs, beans, lentils, and whole grains.  Look at food labels. Choose foods that have more fiber and less sugar.  Avoid processed foods and foods with added sugars. Meal planning  Aim to have the same amount of carbohydrates at each meal and for each snack time.  Plan to have regular, balanced meals and snacks. Where to find more information  American Diabetes Association: www.diabetes.org  Centers for Disease Control and Prevention: www.cdc.gov Summary  Carbohydrate counting is a method of keeping track of how many carbohydrates you eat.  Eating carbohydrates naturally increases the amount of sugar (glucose) in the blood.  Counting how many carbohydrates you eat improves your blood glucose control, which helps you manage your diabetes.  A dietitian can help you make a meal plan and calculate how many carbohydrates you should have at each meal and snack. This information is not intended to replace advice given to you by your health care  provider. Make sure you discuss any questions you have with your health care provider. Document Revised: 08/28/2019 Document Reviewed: 08/29/2019 Elsevier Patient Education  2021 Elsevier Inc.  

## 2020-12-22 NOTE — Progress Notes (Signed)
 New Patient Office Visit  Subjective:  Patient ID: Alexa Dunn, female    DOB: 04/01/1986  Age: 35 y.o. MRN: [id number]  CC: Establish patient care  HPI Alexa Dunn is a 35 year old female who presents for hospital follow up and to establish care. She has a history of DM II, asthma, HTN, and pain to both shoulders. She has not seen a provider or had medical care regarding her chronic diseases in over a year. As a result, she has not been able to obtain refills on her medications and so has not been taking these. She denies any polyuria, polyphagia or polydipsia. She does report symptoms of peripheral neuropathy. She denies lightheadedness or dizziness and does check her feet daily. She has a glucometer at home but has not been checking her blood sugar due to not having a way to treat it. Her Hgb A1c today is 9.6% from 9.7% in May 2021. Her FSBG is 378 mg/dL. Her BP is well-controlled today; although, she had elevated readings at her ED visits. She denies any chest pain, headaches, or swelling in her lower extremities. She does admit that she is making dietary changes and avoiding excess salt and sugar. Her asthma is well-controlled with no episodes of SOB or wheezing. She denies any night awakenings or use of rescue inhaler, which she does not have a refill on either. She does report some fatigue, which has been ongoing for "a long time". She denies any excessive bruising, hot or cold intolerance, weight loss/gain, or symptoms of anxiety/depression. She denies difficulty sleeping at night.   She was recently seen in the ED for pain, unrelated to injury, and decreased ROM to her left shoulder. She reports this has been ongoing for a few months. She experiences tenderness and is unable to lift her arm above her head. She was diagnosed with muscle strain and given IM Toradol. Her imaging was unremarkable. Prior to this, she was seen for pain to her right shoulder after a fall. She was diagnosed with a  "frozen shoulder" per patient. They suggested steroid injections but she was unable to receive these due to her elevated blood sugars. She still experiences difficulties with pain and ROM. She is not currently taking any medications for the pain. Overall, she is concerned about the pain in her shoulders but feels well and offers no further complaints.   Past Medical History:  Diagnosis Date  . Asthma   . Diabetes mellitus without complication (HCC)   . Hypertension     Past Surgical History:  Procedure Laterality Date  . CESAREAN SECTION    . TONSILLECTOMY      Family History  Problem Relation Age of Onset  . Healthy Mother   . Diabetes Father     Social History   Socioeconomic History  . Marital status: Single    Spouse name: Not on file  . Number of children: Not on file  . Years of education: Not on file  . Highest education level: Not on file  Occupational History  . Not on file  Tobacco Use  . Smoking status: Current Every Day Smoker    Packs/day: 0.50    Years: 15.00    Pack years: 7.50  . Smokeless tobacco: Never Used  Vaping Use  . Vaping Use: Never used  Substance and Sexual Activity  . Alcohol use: No  . Drug use: No  . Sexual activity: Not on file  Other Topics Concern  . Not   on file  Social History Narrative  . Not on file   Social Determinants of Health   Financial Resource Strain: Not on file  Food Insecurity: Not on file  Transportation Needs: Not on file  Physical Activity: Not on file  Stress: Not on file  Social Connections: Not on file  Intimate Partner Violence: Not on file    ROS Review of Systems  Constitutional: Positive for fatigue. Negative for unexpected weight change.  HENT: Negative.   Eyes: Negative.   Respiratory: Negative.  Negative for cough, shortness of breath and wheezing.   Cardiovascular: Negative.   Gastrointestinal: Negative.   Endocrine: Negative.   Genitourinary: Negative.   Musculoskeletal: Positive for  arthralgias and myalgias.  Skin: Negative.   Neurological: Positive for numbness. Negative for dizziness, weakness and light-headedness.  Hematological: Negative.   Psychiatric/Behavioral: Negative.     Objective:   Today's Vitals: LMP 11/29/2020 (Exact Date)   Physical Exam Constitutional:      Appearance: Normal appearance. She is obese.  HENT:     Head: Normocephalic.     Right Ear: Tympanic membrane, ear canal and external ear normal.     Left Ear: Tympanic membrane, ear canal and external ear normal.     Nose: Nose normal.     Mouth/Throat:     Mouth: Mucous membranes are moist.     Pharynx: Oropharynx is clear.  Eyes:     Extraocular Movements: Extraocular movements intact.     Conjunctiva/sclera: Conjunctivae normal.     Pupils: Pupils are equal, round, and reactive to light.  Cardiovascular:     Rate and Rhythm: Normal rate and regular rhythm.     Pulses: Normal pulses.          Dorsalis pedis pulses are 2+ on the right side and 2+ on the left side.       Posterior tibial pulses are 2+ on the right side and 2+ on the left side.     Heart sounds: Normal heart sounds.  Pulmonary:     Effort: Pulmonary effort is normal.     Breath sounds: Normal breath sounds.  Abdominal:     General: Bowel sounds are normal.     Palpations: Abdomen is soft.  Musculoskeletal:     Right shoulder: No swelling. Decreased range of motion. Normal strength. Normal pulse.     Left shoulder: Tenderness present. No swelling. Decreased range of motion. Normal strength. Normal pulse.     Cervical back: Normal range of motion and neck supple.     Right lower leg: No edema.     Left lower leg: No edema.     Right foot: No deformity.     Left foot: No deformity.  Feet:     Right foot:     Skin integrity: Skin integrity normal.     Toenail Condition: Right toenails are normal.     Left foot:     Skin integrity: Skin integrity normal.     Toenail Condition: Left toenails are normal.  Skin:     General: Skin is warm and dry.     Capillary Refill: Capillary refill takes less than 2 seconds.  Neurological:     General: No focal deficit present.     Mental Status: She is alert and oriented to person, place, and time. Mental status is at baseline.  Psychiatric:        Mood and Affect: Mood normal.        Behavior: Behavior normal.       Assessment & Plan:   1. Encounter to establish care History and medications reviewed with patient. Patient due for pap smear - will obtain when she goes to GYN. Exercise 150 min/week. Labs obtained today.  - Comp Met (CMET) - CBC w/Diff - Lipid Profile   2. Essential hypertension BP well-controlled at appt. Will recheck in 2 weeks when patient returns. Monitor BP and for lower extremity swelling. Goal <140/90. No current medication regimen. - Comp Met (CMET)  3. Fatigue, unspecified type No other associated symptoms. Likely related to uncontrolled DM. Will r/o other potential underlying etiologies.  - CBC w/Diff - TSH  4. Type 2 diabetes mellitus with hyperglycemia, unspecified whether long term insulin use (HCC) DM uncontrolled. Patient has been unable to obtain her medications for over a year. Will start patient on long acting insulin. Educated on s/s to monitor for. Pt is aware of how to self administer and how to check blood sugar. Monitor blood sugar daily and notify if persistently >180 mg/dL. Will restart metformin at 500mg 1 PO daily until kidney function is known. Notify if any abd pain/discomfort, fatigue, or muscle pain develops. Patient to return in 2 weeks for lab review and medication adjustment. Continue daily self foot exams. Avoid sugary beverages and monitor carb intake. Monitor for hypo- and hyperglycemia. - POCT HgB A1C - Comp Met (CMET) - Urine Microalbumin w/creat. ratio - Ambulatory referral to Ophthalmology - Insulin Glargine (BASAGLAR KWIKPEN) 100 UNIT/ML; Inject 10 Units into the skin at bedtime.  Dispense: 3 mL;  Refill: 5 - metFORMIN (GLUCOPHAGE) 1000 MG tablet; Take 0.5 tablets (500 mg total) by mouth daily with breakfast.  Dispense: 15 tablet; Refill: 0 - blood glucose meter kit and supplies KIT; Dispense based on patient and insurance preference. Use up to four times daily as directed.  Dispense: 1 each; Refill: 0  5. Mild intermittent asthma without complication No acute exacerbations. Go to the ED with SOB or wheezing unrelieved by rescue inhaler. Notify of persistent symptoms or increased use of rescue inhaler. Avoid environmental triggers.  - albuterol (VENTOLIN HFA) 108 (90 Base) MCG/ACT inhaler; Inhale 2 puffs into the lungs every 6 (six) hours as needed for wheezing or shortness of breath.  Dispense: 1 each; Refill: 2  6. Birth control counseling Patient currently has Nexplanon in her arm. She is due for removal as it has been in place for 8 years. She desires to have it replaced or be started on another form of birth control. She is currently sexually active without backup method. Her LMP was 3/21 and she reports some irregularity to her periods.  - Ambulatory referral to Gynecology  7. Shoulder pain, bilateral Previous imaging unremarkable per ED reports. Referral to [facility] orthopedic physician Dr. Fossier for further evaluation and management.   Outpatient Encounter Medications as of 12/22/2020  Medication Sig  . acetaminophen (TYLENOL) 650 MG CR tablet Take 650 mg by mouth every 8 (eight) hours as needed for pain.  . insulin NPH-regular Human (70-30) 100 UNIT/ML injection Inject 18 Units into the skin in the morning and at bedtime.  . meloxicam (MOBIC) 15 MG tablet Take 1 tablet (15 mg total) by mouth daily.  . metFORMIN (GLUCOPHAGE) 1000 MG tablet Take 1,000 mg by mouth 2 (two) times daily with a meal.   No facility-administered encounter medications on file as of 12/22/2020.    Follow-up: Return in 2 weeks (01/05/21), or if symptoms worsen or do not improve.  Katherine V Cobb, RN, BSN,  FNP-S 

## 2020-12-23 LAB — CBC WITH DIFFERENTIAL/PLATELET
Basophils Absolute: 0.1 10*3/uL (ref 0.0–0.2)
Basos: 1 %
EOS (ABSOLUTE): 0.1 10*3/uL (ref 0.0–0.4)
Eos: 1 %
Hematocrit: 44.6 % (ref 34.0–46.6)
Hemoglobin: 14.5 g/dL (ref 11.1–15.9)
Immature Grans (Abs): 0 10*3/uL (ref 0.0–0.1)
Immature Granulocytes: 0 %
Lymphocytes Absolute: 2.3 10*3/uL (ref 0.7–3.1)
Lymphs: 24 %
MCH: 27.8 pg (ref 26.6–33.0)
MCHC: 32.5 g/dL (ref 31.5–35.7)
MCV: 86 fL (ref 79–97)
Monocytes Absolute: 0.5 10*3/uL (ref 0.1–0.9)
Monocytes: 5 %
Neutrophils Absolute: 6.7 10*3/uL (ref 1.4–7.0)
Neutrophils: 69 %
Platelets: 277 10*3/uL (ref 150–450)
RBC: 5.21 x10E6/uL (ref 3.77–5.28)
RDW: 12.5 % (ref 11.7–15.4)
WBC: 9.6 10*3/uL (ref 3.4–10.8)

## 2020-12-23 LAB — COMPREHENSIVE METABOLIC PANEL
ALT: 13 IU/L (ref 0–32)
AST: 18 IU/L (ref 0–40)
Albumin/Globulin Ratio: 1.6 (ref 1.2–2.2)
Albumin: 4.2 g/dL (ref 3.8–4.8)
Alkaline Phosphatase: 54 IU/L (ref 44–121)
BUN/Creatinine Ratio: 25 — ABNORMAL HIGH (ref 9–23)
BUN: 19 mg/dL (ref 6–20)
Bilirubin Total: 0.3 mg/dL (ref 0.0–1.2)
CO2: 17 mmol/L — ABNORMAL LOW (ref 20–29)
Calcium: 9.2 mg/dL (ref 8.7–10.2)
Chloride: 99 mmol/L (ref 96–106)
Creatinine, Ser: 0.75 mg/dL (ref 0.57–1.00)
Globulin, Total: 2.6 g/dL (ref 1.5–4.5)
Glucose: 401 mg/dL — ABNORMAL HIGH (ref 65–99)
Potassium: 4.8 mmol/L (ref 3.5–5.2)
Sodium: 137 mmol/L (ref 134–144)
Total Protein: 6.8 g/dL (ref 6.0–8.5)
eGFR: 108 mL/min/{1.73_m2} (ref >59)

## 2020-12-23 LAB — LIPID PANEL
Chol/HDL Ratio: 3.8 ratio (ref 0.0–4.4)
Cholesterol, Total: 161 mg/dL (ref 100–199)
HDL: 42 mg/dL (ref >39)
LDL Chol Calc (NIH): 85 mg/dL (ref 0–99)
Triglycerides: 202 mg/dL — ABNORMAL HIGH (ref 0–149)
VLDL Cholesterol Cal: 34 mg/dL (ref 5–40)

## 2020-12-23 LAB — TSH: TSH: 1.53 u[IU]/mL (ref 0.450–4.500)

## 2020-12-23 LAB — MICROALBUMIN / CREATININE URINE RATIO
Creatinine, Urine: 34.4 mg/dL
Microalb/Creat Ratio: 921 mg/g creat — ABNORMAL HIGH (ref 0–29)
Microalbumin, Urine: 316.9 ug/mL

## 2021-01-06 ENCOUNTER — Other Ambulatory Visit: Payer: Self-pay

## 2021-01-06 ENCOUNTER — Encounter: Payer: Self-pay | Admitting: Gerontology

## 2021-01-06 ENCOUNTER — Ambulatory Visit: Payer: Self-pay | Admitting: Gerontology

## 2021-01-06 VITALS — BP 118/60 | HR 88 | Temp 97.2°F | Resp 18 | Ht 65.0 in | Wt 201.6 lb

## 2021-01-06 DIAGNOSIS — E119 Type 2 diabetes mellitus without complications: Secondary | ICD-10-CM

## 2021-01-06 DIAGNOSIS — E1165 Type 2 diabetes mellitus with hyperglycemia: Secondary | ICD-10-CM

## 2021-01-06 MED ORDER — BASAGLAR KWIKPEN 100 UNIT/ML ~~LOC~~ SOPN
14.0000 [IU] | PEN_INJECTOR | Freq: Every day | SUBCUTANEOUS | 5 refills | Status: DC
  Filled 2021-01-06: qty 3, 21d supply, fill #0

## 2021-01-06 MED ORDER — METFORMIN HCL 1000 MG PO TABS
1000.0000 mg | ORAL_TABLET | Freq: Two times a day (BID) | ORAL | 2 refills | Status: DC
  Filled 2021-01-06: qty 60, 30d supply, fill #0

## 2021-01-06 NOTE — Patient Instructions (Signed)

## 2021-01-06 NOTE — Progress Notes (Signed)
Established Patient Office Visit  Subjective:  Patient ID: Alexa Dunn, female    DOB: September 27, 1985  Age: 35 y.o. MRN: [id number]  CC:  Chief Complaint  Patient presents with  . Follow-up    Lab results  . Diabetes    HPI Alexa Dunn a 35 y/o female who has a history of Asthma, T2DM, Hypertension and Lactose Intolerance, presents for f/u of T2DM. Her HgbA1c done on 12/22/20 was 9.6%. She states that she's compliant with her medications and checks her blood glucose tid. She states that her fasting blood glucose is usually between 146m-210 mg/dl. Blood glucose  this morning was 147 mg/dl. She denies hypo/hyperglycemic symptoms, peripheral neuropathy and performs daily foot checks. She states that her breathing is stable. Overall, she states that she's doing well and offers no further complaint.  Past Medical History:  Diagnosis Date  . Asthma   . [place]   . Hypertension   . Lactose intolerance     Past Surgical History:  Procedure Laterality Date  . CESAREAN SECTION    . TONSILLECTOMY      Family History  Problem Relation Age of Onset  . Healthy Mother   . Diabetes Father   . COPD Father   . Heart disease Father   . Vitamin D deficiency Daughter   . Arthritis Maternal Aunt   . Meniere's disease Sister   . Blindness Paternal Grandmother   . COPD Paternal Grandfather     Social History   Socioeconomic History  . Marital status: Single    Spouse name: Not on file  . Number of children: Not on file  . Years of education: Not on file  . Highest education level: Not on file  Occupational History  . Not on file  Tobacco Use  . Smoking status: Current Every Day Smoker    Packs/day: 0.50    Years: 16.00    Pack years: 8.00    Types: Cigarettes  . Smokeless tobacco: Never Used  Vaping Use  . Vaping Use: Never used  Substance and Sexual Activity  . Alcohol use: No  . Drug use: Yes    Types: Marijuana    Comment: 2 times per  month  . Sexual activity: Yes    Birth control/protection: Implant    Comment: Nexplanon placed 2014  Other Topics Concern  . Not on file  Social History Narrative  . Not on file   Social Determinants of Health   Financial Resource Strain: Not on file  Food Insecurity: No Food Insecurity  . Worried About RCharity fundraiserin the Last Year: Never true  . Ran Out of Food in the Last Year: Never true  Transportation Needs: No Transportation Needs  . Lack of Transportation (Medical): No  . Lack of Transportation (Non-Medical): No  Physical Activity: Not on file  Stress: Not on file  Social Connections: Not on file  Intimate Partner Violence: Not At Risk  . Fear of Current or Ex-Partner: No  . Emotionally Abused: No  . Physically Abused: No  . Sexually Abused: No    Outpatient Medications Prior to Visit  Medication Sig Dispense Refill  . albuterol (VENTOLIN HFA) 108 (90 Base) MCG/ACT inhaler Inhale 2 puffs into the lungs every 6 (six) hours as needed for wheezing or shortness of breath. 6.7 g 2  . Blood Glucose Monitoring Suppl (BLOOD GLUCOSE MONITOR SYSTEM) w/Device KIT Use as directed to test blood glucose levels 1 kit  1  . glucose blood (RIGHTEST GS550 BLOOD GLUCOSE) test strip Use as directed to test blood glucose levels (Patient taking differently: Use as directed to test blood glucose levels) 100 each 11  . Insulin Pen Needle (COMFORT EZ PEN NEEDLES) 32G X 4 MM MISC Use as directed with insulin 100 each 11  . Rightest GL300 Lancets MISC Use as directed to test blood glucose levels 100 each 11  . Insulin Glargine (BASAGLAR KWIKPEN) 100 UNIT/ML Inject 10 Units into the skin at bedtime. 3 mL 5  . metFORMIN (GLUCOPHAGE) 500 MG tablet Take 1 tablet (500 mg total) by mouth daily with breakfast. 30 tablet 0  . acetaminophen (TYLENOL) 650 MG CR tablet Take 650 mg by mouth every 8 (eight) hours as needed for pain. (Patient not taking: Reported on 12/22/2020)    . meloxicam (MOBIC) 15  MG tablet Take 1 tablet (15 mg total) by mouth daily. (Patient not taking: Reported on 12/22/2020) 30 tablet 2   No facility-administered medications prior to visit.    Allergies  Allergen Reactions  . Penicillins Hives    Other reaction(s): UNKNOWN    ROS Review of Systems  Constitutional: Negative.   Eyes: Negative.   Respiratory: Negative.   Cardiovascular: Negative.   Endocrine: Negative.   Hematological: Negative.       Objective:    Physical Exam HENT:     Head: Normocephalic and atraumatic.  Eyes:     Extraocular Movements: Extraocular movements intact.     Conjunctiva/sclera: Conjunctivae normal.     Pupils: Pupils are equal, round, and reactive to light.  Cardiovascular:     Rate and Rhythm: Normal rate and regular rhythm.     Pulses: Normal pulses.     Heart sounds: Normal heart sounds.  Pulmonary:     Effort: Pulmonary effort is normal.     Breath sounds: Normal breath sounds.  Skin:    General: Skin is warm.  Neurological:     General: No focal deficit present.     Mental Status: She is alert and oriented to person, place, and time. Mental status is at baseline.  Psychiatric:        Mood and Affect: Mood normal.        Behavior: Behavior normal.        Thought Content: Thought content normal.        Judgment: Judgment normal.     BP 118/60 (BP Location: Right Arm, Patient Position: Sitting, Cuff Size: Normal)   Pulse 88   Temp (!) 97.2 F (36.2 C)   Resp 18   Ht _0  (1.651 m)   Wt 201 lb 9.6 oz (91.4 kg)   LMP 12/22/2020 (Approximate)   SpO2 95%   BMI 33.55 kg/m  Wt Readings from Last 3 Encounters:  01/06/21 201 lb 9.6 oz (91.4 kg)  12/22/20 200 lb 4.8 oz (90.9 kg)  12/01/20 195 lb (88.5 kg)    Weight loss encouraged  Health Maintenance Due  Topic Date Due  . Hepatitis C Screening  Never done  . COVID-19 Vaccine (1) Never done  . OPHTHALMOLOGY EXAM  Never done  . PAP SMEAR-Modifier  Never done    There are no preventive care  reminders to display for this patient.  Lab Results  Component Value Date   TSH 1.530 12/22/2020   Lab Results  Component Value Date   WBC 9.6 12/22/2020   HGB 14.5 12/22/2020   HCT 44.6 12/22/2020   MCV 86 12/22/2020  PLT 277 12/22/2020   Lab Results  Component Value Date   NA 137 12/22/2020   K 4.8 12/22/2020   CO2 17 (L) 12/22/2020   GLUCOSE 401 (H) 12/22/2020   BUN 19 12/22/2020   CREATININE 0.75 12/22/2020   BILITOT 0.3 12/22/2020   ALKPHOS 54 12/22/2020   AST 18 12/22/2020   ALT 13 12/22/2020   PROT 6.8 12/22/2020   ALBUMIN 4.2 12/22/2020   CALCIUM 9.2 12/22/2020   ANIONGAP 6 01/27/2020   EGFR 108 12/22/2020   Lab Results  Component Value Date   CHOL 161 12/22/2020   Lab Results  Component Value Date   HDL 42 12/22/2020   Lab Results  Component Value Date   LDLCALC 85 12/22/2020   Lab Results  Component Value Date   TRIG 202 (H) 12/22/2020   Lab Results  Component Value Date   CHOLHDL 3.8 12/22/2020   Lab Results  Component Value Date   HGBA1C 9.6 (A) 12/22/2020      Assessment & Plan:   1. Type 2 diabetes mellitus with hyperglycemia, unspecified whether long term insulin use (HCC) - Her HgbA1c was 9.6%, and her goal should be less than 7%. Her Metformin was increased to 1000 mg bid, Insulin glargine to 14 units qhs. She was also advised to continue on low carb diet and exercise as tolerated. - metFORMIN (GLUCOPHAGE) 1000 MG tablet; Take 1 tablet (1,000 mg total) by mouth 2 (two) times daily with a meal.  Dispense: 60 tablet; Refill: 2 - Insulin Glargine (BASAGLAR KWIKPEN) 100 UNIT/ML; Inject 14 Units into the skin once daily at bedtime for 1 week, if fasting blood sugar is greater than 150 mg/dl increase to 17 units once daily at bedtime.  Dispense: 3 mL; Refill: 5 - HgB A1c; Future      Follow-up: Return in about 3 months (around 03/30/2021), or if symptoms worsen or fail to improve.    Elenie Coven Jerold Coombe, NP

## 2021-01-07 ENCOUNTER — Other Ambulatory Visit: Payer: Self-pay

## 2021-01-18 ENCOUNTER — Other Ambulatory Visit: Payer: Self-pay

## 2021-01-18 ENCOUNTER — Ambulatory Visit: Payer: Self-pay | Admitting: Specialist

## 2021-01-18 DIAGNOSIS — M25512 Pain in left shoulder: Secondary | ICD-10-CM

## 2021-01-18 DIAGNOSIS — G8929 Other chronic pain: Secondary | ICD-10-CM

## 2021-01-18 NOTE — Progress Notes (Signed)
HPI 35 y.o. known diabetic with bilateral shoulder problems l>R. 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 She saw orthopedics a few years ago but could not afford treatment.  She is unable to sleep on L side. She has to fasten her bra in front and turn it. She is able to wash her hair with her R arm but her boyfriend helps a lot.  ASSESSMENT R. Arm: FF = 110 degrees. EXT = 30 degrees. ADD = 0 degrees. ABD = 100 degrees. IR = 60 degrees. ER = 20 degrees.   L. Arm: FF = 40 degrees. EXT = 10 degrees. ADD = 0 degrees. ABD = 0 degrees. IR and ER potentially nill.   She is to light palpation, she is globally tender above the shoulder. PROM markedly limited in all planes.   While we were speaking, she holds her arm at her side with her elbow flexed.   PLAN Adhesive capsulitis bilateral.  I think she will eventually need an MUA but I have to give PT a chance. Refer her to pt. Return here after 1 month on therapy.

## 2021-02-22 ENCOUNTER — Ambulatory Visit: Payer: Self-pay | Admitting: Specialist

## 2021-03-07 ENCOUNTER — Other Ambulatory Visit: Payer: Self-pay

## 2021-03-09 ENCOUNTER — Other Ambulatory Visit: Payer: Self-pay

## 2021-03-10 ENCOUNTER — Other Ambulatory Visit: Payer: Self-pay

## 2021-03-11 ENCOUNTER — Ambulatory Visit: Payer: Self-pay | Admitting: Pharmacy Technician

## 2021-03-11 ENCOUNTER — Other Ambulatory Visit: Payer: Self-pay

## 2021-03-11 DIAGNOSIS — Z79899 Other long term (current) drug therapy: Secondary | ICD-10-CM

## 2021-03-11 NOTE — Progress Notes (Signed)
Completed [facility] application.and Engineer, petroleum to patient to sign.    Patient still needs to provide last 30 days of paystubs, bank statement and 2021 Federal Tax Return.      Provided patient with community resource material based on her particular needs.    Sherilyn Dacosta Care Manager [facility]

## 2021-03-16 ENCOUNTER — Other Ambulatory Visit: Payer: Self-pay

## 2021-03-17 ENCOUNTER — Other Ambulatory Visit: Payer: Self-pay

## 2021-03-18 ENCOUNTER — Other Ambulatory Visit: Payer: Self-pay

## 2021-03-23 ENCOUNTER — Other Ambulatory Visit: Payer: Self-pay

## 2021-03-30 ENCOUNTER — Ambulatory Visit: Payer: Self-pay | Admitting: Gerontology

## 2021-03-31 ENCOUNTER — Other Ambulatory Visit: Payer: Self-pay

## 2021-05-28 ENCOUNTER — Emergency Department
Admission: EM | Admit: 2021-05-28 | Discharge: 2021-05-28 | Disposition: A | Discharge status: Home / Self Care | Payer: Self-pay | Attending: Emergency Medicine | Admitting: Emergency Medicine

## 2021-05-28 ENCOUNTER — Other Ambulatory Visit: Payer: Self-pay

## 2021-05-28 DIAGNOSIS — Z5321 Procedure and treatment not carried out due to patient leaving prior to being seen by health care provider: Secondary | ICD-10-CM | POA: Insufficient documentation

## 2021-05-28 DIAGNOSIS — R42 Dizziness and giddiness: Secondary | ICD-10-CM | POA: Insufficient documentation

## 2021-05-28 DIAGNOSIS — R739 Hyperglycemia, unspecified: Secondary | ICD-10-CM | POA: Insufficient documentation

## 2021-05-28 LAB — CBC
HCT: 45.9 % (ref 36.0–46.0)
Hemoglobin: 15.9 g/dL — ABNORMAL HIGH (ref 12.0–15.0)
MCH: 29 pg (ref 26.0–34.0)
MCHC: 34.6 g/dL (ref 30.0–36.0)
MCV: 83.6 fL (ref 80.0–100.0)
Platelets: 280 10*3/uL (ref 150–400)
RBC: 5.49 MIL/uL — ABNORMAL HIGH (ref 3.87–5.11)
RDW: 12.2 % (ref 11.5–15.5)
WBC: 11.6 10*3/uL — ABNORMAL HIGH (ref 4.0–10.5)
nRBC: 0 % (ref 0.0–0.2)

## 2021-05-28 LAB — BASIC METABOLIC PANEL
Anion gap: 10 (ref 5–15)
BUN: 15 mg/dL (ref 6–20)
CO2: 23 mmol/L (ref 22–32)
Calcium: 8.7 mg/dL — ABNORMAL LOW (ref 8.9–10.3)
Chloride: 98 mmol/L (ref 98–111)
Creatinine, Ser: 0.7 mg/dL (ref 0.44–1.00)
GFR, Estimated: >60 mL/min (ref >60)
Glucose, Bld: 315 mg/dL — ABNORMAL HIGH (ref 70–99)
Potassium: 4.4 mmol/L (ref 3.5–5.1)
Sodium: 131 mmol/L — ABNORMAL LOW (ref 135–145)

## 2021-05-28 LAB — CBG MONITORING, ED: Glucose-Capillary: 283 mg/dL — ABNORMAL HIGH (ref 70–99)

## 2021-05-28 NOTE — ED Notes (Signed)
This RN called and spoke with patient on her personal cell phone [phone], pt states she is no longer here.

## 2021-05-28 NOTE — ED Triage Notes (Addendum)
Pt states that she passed out at work- pt states she got hot al of a sudden and dizzy- pt states that EMS took her CBG and it was 380- pt states that the dizziness has gotten a little better- pt states she did not fall or hit her head when she passed out- pt states she did donate plasma today

## 2021-06-22 ENCOUNTER — Emergency Department
Admission: EM | Admit: 2021-06-22 | Discharge: 2021-06-23 | Disposition: A | Discharge status: Home / Self Care | Payer: Self-pay | Attending: Emergency Medicine | Admitting: Emergency Medicine

## 2021-06-22 ENCOUNTER — Encounter: Payer: Self-pay | Admitting: Emergency Medicine

## 2021-06-22 ENCOUNTER — Emergency Department: Payer: Self-pay

## 2021-06-22 ENCOUNTER — Other Ambulatory Visit: Payer: Self-pay

## 2021-06-22 DIAGNOSIS — R1084 Generalized abdominal pain: Secondary | ICD-10-CM | POA: Insufficient documentation

## 2021-06-22 DIAGNOSIS — Z794 Long term (current) use of insulin: Secondary | ICD-10-CM | POA: Insufficient documentation

## 2021-06-22 DIAGNOSIS — R102 Pelvic and perineal pain: Secondary | ICD-10-CM

## 2021-06-22 DIAGNOSIS — J45909 Unspecified asthma, uncomplicated: Secondary | ICD-10-CM | POA: Insufficient documentation

## 2021-06-22 DIAGNOSIS — I10 Essential (primary) hypertension: Secondary | ICD-10-CM | POA: Insufficient documentation

## 2021-06-22 DIAGNOSIS — E119 Type 2 diabetes mellitus without complications: Secondary | ICD-10-CM | POA: Insufficient documentation

## 2021-06-22 DIAGNOSIS — R319 Hematuria, unspecified: Secondary | ICD-10-CM | POA: Insufficient documentation

## 2021-06-22 DIAGNOSIS — F17210 Nicotine dependence, cigarettes, uncomplicated: Secondary | ICD-10-CM | POA: Insufficient documentation

## 2021-06-22 DIAGNOSIS — N83202 Unspecified ovarian cyst, left side: Secondary | ICD-10-CM | POA: Insufficient documentation

## 2021-06-22 DIAGNOSIS — Z7984 Long term (current) use of oral hypoglycemic drugs: Secondary | ICD-10-CM | POA: Insufficient documentation

## 2021-06-22 LAB — URINALYSIS, COMPLETE (UACMP) WITH MICROSCOPIC
Bacteria, UA: NONE SEEN
Bilirubin Urine: NEGATIVE
Glucose, UA: >=500 mg/dL — AB
Ketones, ur: NEGATIVE mg/dL
Leukocytes,Ua: NEGATIVE
Nitrite: NEGATIVE
Protein, ur: >=300 mg/dL — AB
Specific Gravity, Urine: 1.024 (ref 1.005–1.030)
pH: 5 (ref 5.0–8.0)

## 2021-06-22 LAB — BASIC METABOLIC PANEL
Anion gap: 7 (ref 5–15)
BUN: 13 mg/dL (ref 6–20)
CO2: 27 mmol/L (ref 22–32)
Calcium: 9.1 mg/dL (ref 8.9–10.3)
Chloride: 98 mmol/L (ref 98–111)
Creatinine, Ser: 0.74 mg/dL (ref 0.44–1.00)
GFR, Estimated: >60 mL/min (ref >60)
Glucose, Bld: 229 mg/dL — ABNORMAL HIGH (ref 70–99)
Potassium: 4 mmol/L (ref 3.5–5.1)
Sodium: 132 mmol/L — ABNORMAL LOW (ref 135–145)

## 2021-06-22 LAB — CBC
HCT: 39.9 % (ref 36.0–46.0)
Hemoglobin: 13.9 g/dL (ref 12.0–15.0)
MCH: 29.4 pg (ref 26.0–34.0)
MCHC: 34.8 g/dL (ref 30.0–36.0)
MCV: 84.5 fL (ref 80.0–100.0)
Platelets: 292 10*3/uL (ref 150–400)
RBC: 4.72 MIL/uL (ref 3.87–5.11)
RDW: 12.3 % (ref 11.5–15.5)
WBC: 9 10*3/uL (ref 4.0–10.5)
nRBC: 0 % (ref 0.0–0.2)

## 2021-06-22 LAB — CBG MONITORING, ED: Glucose-Capillary: 253 mg/dL — ABNORMAL HIGH (ref 70–99)

## 2021-06-22 LAB — POC URINE PREG, ED: Preg Test, Ur: NEGATIVE

## 2021-06-22 MED ORDER — KETOROLAC TROMETHAMINE 30 MG/ML IJ SOLN
30.0000 mg | Freq: Once | INTRAMUSCULAR | Status: AC
  Administered 2021-06-22: 30 mg via INTRAVENOUS
  Filled 2021-06-22: qty 1

## 2021-06-22 MED ORDER — ONDANSETRON HCL 4 MG/2ML IJ SOLN
4.0000 mg | Freq: Once | INTRAMUSCULAR | Status: AC
  Administered 2021-06-22: 4 mg via INTRAVENOUS
  Filled 2021-06-22: qty 2

## 2021-06-22 NOTE — ED Notes (Signed)
Patient transported to CT 

## 2021-06-22 NOTE — ED Provider Notes (Signed)
[facility] Emergency Department Provider Note  ____________________________________________   Event Date/Time   First MD Initiated Contact with Patient 06/22/21 2320     (approximate)  I have reviewed the triage vital signs and the nursing notes.   HISTORY  Chief Complaint Hematuria    HPI Alexa Dunn is a 35 y.o. female with history of previous pyelonephritis and E. coli bacteremia in May 2021, hypertension, diabetes, asthma who presents to the emergency department with 3 to 4 days of feeling bloated and a "heavy feeling" in her lower abdomen and lower back bilaterally.  States she has seen blood with wiping after urinating but states it is not vaginal bleeding.  No dysuria but has had urinary frequency.  Denies any abnormal vaginal bleeding, discharge.  No fevers or chills.  Has had nausea for 2 days but no vomiting or diarrhea.  No history of kidney stones.  She was concerned she could have another kidney infection today.  No injury to her back.  No numbness, tingling or focal weakness.  She has had previous cesarean section.        Past Medical History:  Diagnosis Date   Asthma    Diabetes mellitus without complication ([place])    Hypertension    Lactose intolerance     Patient Active Problem List   Diagnosis Date Noted   Shoulder pain, bilateral 12/22/2020   E coli bacteremia 01/26/2020   Sepsis ([place]) 01/25/2020   Acute pyelonephritis 01/25/2020   Diabetes mellitus without complication ([place])    Asthma     Past Surgical History:  Procedure Laterality Date   CESAREAN SECTION     TONSILLECTOMY      Prior to Admission medications   Medication Sig Start Date End Date Taking? Authorizing Provider  albuterol (VENTOLIN HFA) 108 (90 Base) MCG/ACT inhaler Inhale 2 puffs into the lungs every 6 (six) hours as needed for wheezing or shortness of breath. 12/22/20   Iloabachie, Chioma E, NP  Blood Glucose Monitoring Suppl (BLOOD GLUCOSE MONITOR  SYSTEM) w/Device KIT Use as directed to test blood glucose levels 12/22/20   Cammy Copa, RPH  glucose blood (RIGHTEST [phone] BLOOD GLUCOSE) test strip Use as directed to test blood glucose levels Patient taking differently: Use as directed to test blood glucose levels 12/22/20   Ellie Lunch K, [facility]  Insulin Glargine (BASAGLAR KWIKPEN) 100 UNIT/ML Inject 14 Units into the skin once daily at bedtime for 1 week, if fasting blood sugar is greater than 150 mg/dl increase to 17 units once daily at bedtime. 01/06/21   Iloabachie, Chioma E, NP  Insulin Pen Needle (COMFORT EZ PEN NEEDLES) 32G X 4 MM MISC Use as directed with insulin 12/22/20   Cammy Copa, RPH  metFORMIN (GLUCOPHAGE) 1000 MG tablet Take 1 tablet (1,000 mg total) by mouth 2 (two) times daily with a meal. 01/06/21   Iloabachie, Chioma E, NP  Rightest GL300 Lancets MISC Use as directed to test blood glucose levels 12/22/20   Cammy Copa, RPH    Allergies Penicillins  Family History  Problem Relation Age of Onset   Healthy Mother    Diabetes Father    COPD Father    Heart disease Father    Vitamin D deficiency Daughter    Arthritis Maternal Aunt    Meniere's disease Sister    Blindness Paternal Grandmother    COPD Paternal Grandfather     Social History Social History   Tobacco Use   Smoking  status: Every Day    Packs/day: 0.50    Years: 16.00    Pack years: 8.00    Types: Cigarettes   Smokeless tobacco: Never  Vaping Use   Vaping Use: Never used  Substance Use Topics   Alcohol use: No   Drug use: Yes    Types: Marijuana    Comment: 2 times per month    Review of Systems Constitutional: No fever. Eyes: No visual changes. ENT: No sore throat. Cardiovascular: Denies chest pain. Respiratory: Denies shortness of breath. Gastrointestinal: No vomiting, diarrhea. Genitourinary: Negative for dysuria. Musculoskeletal: + for back pain. Skin: Negative for rash. Neurological: Negative for focal weakness or  numbness.  ____________________________________________   PHYSICAL EXAM:  VITAL SIGNS: ED Triage Vitals  Enc Vitals Group     BP 06/22/21 2058 134/83     Pulse Rate 06/22/21 2058 95     Resp 06/22/21 2058 18     Temp 06/22/21 2058 98.3 F (36.8 C)     Temp Source 06/22/21 2058 Oral     SpO2 06/22/21 2058 98 %     Weight 06/22/21 2058 201 lb (91.2 kg)     Height 06/22/21 2058 5' 5"  (1.651 m)     Head Circumference --      Peak Flow --      Pain Score 06/22/21 2057 9     Pain Loc --      Pain Edu? --      Excl. in [place]? --    CONSTITUTIONAL: Alert and oriented and responds appropriately to questions.  Appears uncomfortable. HEAD: Normocephalic EYES: Conjunctivae clear, pupils appear equal, EOM appear intact ENT: normal nose; moist mucous membranes NECK: Supple, normal ROM CARD: RRR; S1 and S2 appreciated; no murmurs, no clicks, no rubs, no gallops RESP: Normal chest excursion without splinting or tachypnea; breath sounds clear and equal bilaterally; no wheezes, no rhonchi, no rales, no hypoxia or respiratory distress, speaking full sentences ABD/GI: Normal bowel sounds; non-distended; soft, fusilli tender to palpation, no rebound, no guarding, no peritoneal signs, no hepatosplenomegaly GU:  Normal external genitalia. No lesions, rashes noted. Patient has no vaginal bleeding on exam.  Minimal amount of thick white vaginal discharge.  No right adnexal tenderness, mass or fullness, no cervical motion tenderness.  Patient does have some left adnexal tenderness without mass or fullness.  Cervix is not appear friable.  Cervix is closed.  Chaperone present for exam. BACK: The back appears normal, no CVA tenderness, tender to palpation over the lower lumbar paraspinal muscles bilaterally, no midline step-off or deformity EXT: Normal ROM in all joints; no deformity noted, no edema; no cyanosis SKIN: Normal color for age and race; warm; no rash on exposed skin NEURO: Moves all extremities  equally PSYCH: The patient's mood and manner are appropriate.  ____________________________________________   LABS (all labs ordered are listed, but only abnormal results are displayed)  Labs Reviewed  WET PREP, GENITAL - Abnormal; Notable for the following components:      Result Value   WBC, Wet Prep HPF POC FEW (*)    All other components within normal limits  URINALYSIS, COMPLETE (UACMP) WITH MICROSCOPIC - Abnormal; Notable for the following components:   Color, Urine YELLOW (*)    APPearance CLEAR (*)    Glucose, UA >=500 (*)    Hgb urine dipstick MODERATE (*)    Protein, ur >=300 (*)    All other components within normal limits  BASIC METABOLIC PANEL - Abnormal; Notable for the  following components:   Sodium 132 (*)    Glucose, Bld 229 (*)    All other components within normal limits  CBG MONITORING, ED - Abnormal; Notable for the following components:   Glucose-Capillary 253 (*)    All other components within normal limits  CHLAMYDIA/NGC RT PCR ([facility] ONLY)            URINE CULTURE  CBC  POC URINE PREG, ED   ____________________________________________  EKG   ____________________________________________  RADIOLOGY I, Kasiya Burck, personally viewed and evaluated these images (plain radiographs) as part of my medical decision making, as well as reviewing the written report by the radiologist.  ED MD interpretation: Left ovarian cyst seen on CT scan and ultrasound without any sign of torsion.  Official radiology report(s): CT Renal Stone Study  Result Date: 06/23/2021 CLINICAL DATA:  Flank pain and hematuria EXAM: CT ABDOMEN AND PELVIS WITHOUT CONTRAST TECHNIQUE: Multidetector CT imaging of the abdomen and pelvis was performed following the standard protocol without IV contrast. COMPARISON:  None. FINDINGS: LOWER CHEST: Normal. HEPATOBILIARY: Normal hepatic contours. No intra- or extrahepatic biliary dilatation. The gallbladder is normal. PANCREAS: Normal pancreas.  No ductal dilatation or peripancreatic fluid collection. SPLEEN: Normal. ADRENALS/URINARY TRACT: The adrenal glands are normal. No hydronephrosis, nephroureterolithiasis or solid renal mass. The urinary bladder is normal for degree of distention STOMACH/BOWEL: There is no hiatal hernia. Normal duodenal course and caliber. No small bowel dilatation or inflammation. No focal colonic abnormality. Normal appendix. VASCULAR/LYMPHATIC: Normal course and caliber of the major abdominal vessels. No abdominal or pelvic lymphadenopathy. REPRODUCTIVE: Normal uterus.  4.5 cm left adnexal cyst. MUSCULOSKELETAL. No bony spinal canal stenosis or focal osseous abnormality. OTHER: None. IMPRESSION: 1. No acute abnormality of the abdomen or pelvis. 2. 4.5 cm left adnexal simple-appearing cyst. No follow-up imaging is recommended. Reference: JACR 2020 Feb;17(2):248-254 Electronically Signed   By: Ulyses Jarred M.D.   On: 06/23/2021 00:11   US PELVIC COMPLETE W TRANSVAGINAL AND TORSION R/O  Result Date: 06/23/2021 CLINICAL DATA:  Pelvic pain.  LMP 05/21/2021 EXAM: TRANSABDOMINAL AND TRANSVAGINAL ULTRASOUND OF PELVIS DOPPLER ULTRASOUND OF OVARIES TECHNIQUE: Both transabdominal and transvaginal ultrasound examinations of the pelvis were performed. Transabdominal technique was performed for global imaging of the pelvis including uterus, ovaries, adnexal regions, and pelvic cul-de-sac. It was necessary to proceed with endovaginal exam following the transabdominal exam to visualize the ovaries bilaterally. Color and duplex Doppler ultrasound was utilized to evaluate blood flow to the ovaries. COMPARISON:  11/23/2006 FINDINGS: Uterus Measurements: 9.5 x 5.2 x 6.6 cm = volume: 171 mL. No fibroids or other mass visualized. The uterus is anteverted. The cervix is unremarkable. Endometrium Thickness: 8 mm.  No focal abnormality visualized. Right ovary Measurements: 4.2 x 3.1 x 2.8 cm = volume: 19 mL. Normal appearance/no adnexal mass. Left  ovary Measurements: 6.7 x 4.4 x 6.1 cm = volume: 95 mL. Multiple follicles are seen within the left ovary. There is a complex cystic lesion within the left ovary measuring 4.5 x 4.3 x 3.9 cm demonstrating internal echogenic soft tissue demonstrating scalloped margins in keeping with a hemorrhagic cyst. Pulsed Doppler evaluation of both ovaries demonstrates normal low-resistance arterial and venous waveforms. Other findings No abnormal free fluid. IMPRESSION: 4.5 cm left ovarian hemorrhagic cyst. No further follow-up is required. Otherwise unremarkable pelvic sonogram. Electronically Signed   By: Fidela Salisbury M.D.   On: 06/23/2021 03:50    ____________________________________________   PROCEDURES  Procedure(s) performed (including Critical Care):  Procedures   ____________________________________________  INITIAL IMPRESSION / ASSESSMENT AND PLAN / ED COURSE  As part of my medical decision making, I reviewed the following data within the [facility] notes reviewed and incorporated, Labs reviewed , Old chart reviewed, CT and ultrasound reviewed, and Notes from prior ED visits         Patient here with diffuse abdominal pain, bilateral flank pain.  History of pyelonephritis previously and she was concerned this could be reoccurring today.  Vital signs today are reassuring.  No fever.  Labs obtained from triage show no leukocytosis.  Pregnancy test negative.  Urine does not appear infected today but we will add on a urine culture.  Will obtain CT of the abdomen pelvis for further evaluation.  Differential includes kidney stone, early pyelonephritis or UTI, appendicitis, cholelithiasis, cholecystitis, colitis, diverticulitis.  Doubt bowel obstruction.  Could also be musculoskeletal back pain.  Will give Toradol, Zofran and reassess.  ED PROGRESS  Patient reports no improvement after Toradol.  Will give Bentyl.  CT scan shows left ovarian cyst.  Given she is having  lower abdominal pain we did discuss this could be the cause of her symptoms.  Offered STD screening today which she agrees to.  States she is sexually active with 1 female partner but is worried he is not monogamous.  Patient's pelvic exam reveals left adnexal tenderness without mass.  No cervical motion tenderness.  Small amount of white thick discharge.  Wet prep, gonorrhea and chlamydia pending.  Will obtain transvaginal ultrasound for further evaluation and to rule out torsion.   4:03 AM  Pt's wet prep, gonorrhea and chlamydia swabs are unremarkable.  Transvaginal ultrasound shows left ovarian cyst but no torsion or other acute abnormality.  Recommended alternating Tylenol, Motrin for pain.  Will give outpatient OB/GYN follow-up as needed.  Patient comfortable with this plan.  At this time, I do not feel there is any life-threatening condition present. I have reviewed, interpreted and discussed all results (EKG, imaging, lab, urine as appropriate) and exam findings with patient/family. I have reviewed nursing notes and appropriate previous records.  I feel the patient is safe to be discharged home without further emergent workup and can continue workup as an outpatient as needed. Discussed usual and customary return precautions. Patient/family verbalize understanding and are comfortable with this plan.  Outpatient follow-up has been provided as needed. All questions have been answered.  ____________________________________________   FINAL CLINICAL IMPRESSION(S) / ED DIAGNOSES  Final diagnoses:  Generalized abdominal pain  Pelvic pain  Left ovarian cyst     ED Discharge Orders     None       *Please note:  Alexa Dunn was evaluated in Emergency Department on 06/23/2021 for the symptoms described in the history of present illness. She was evaluated in the context of the global COVID-19 pandemic, which necessitated consideration that the patient might be at risk for infection with the  SARS-CoV-2 virus that causes COVID-19. Institutional protocols and algorithms that pertain to the evaluation of patients at risk for COVID-19 are in a state of rapid change based on information released by regulatory bodies including the CDC and federal and state organizations. These policies and algorithms were followed during the patient's care in the ED.  Some ED evaluations and interventions may be delayed as a result of limited staffing during and the pandemic.*   Note:  This document was prepared using Dragon voice recognition software and may include unintentional dictation errors.    Rafik Koppel, Cyril Mourning  N, DO 06/23/21 0403

## 2021-06-22 NOTE — ED Triage Notes (Signed)
Pt arrived via POV with reports of low back pain and hematuria for the past several days, hx of kidney infections.

## 2021-06-23 ENCOUNTER — Emergency Department: Payer: Self-pay

## 2021-06-23 LAB — CHLAMYDIA/NGC RT PCR (ARMC ONLY)
Chlamydia Tr: NOT DETECTED
N gonorrhoeae: NOT DETECTED

## 2021-06-23 LAB — WET PREP, GENITAL
Clue Cells Wet Prep HPF POC: NONE SEEN
Sperm: NONE SEEN
Trich, Wet Prep: NONE SEEN
Yeast Wet Prep HPF POC: NONE SEEN

## 2021-06-23 MED ORDER — DICYCLOMINE HCL 10 MG/ML IM SOLN
20.0000 mg | Freq: Once | INTRAMUSCULAR | Status: DC
  Filled 2021-06-23: qty 2

## 2021-06-23 MED ORDER — DICYCLOMINE HCL 10 MG PO CAPS
20.0000 mg | ORAL_CAPSULE | Freq: Once | ORAL | Status: AC
  Administered 2021-06-23: 20 mg via ORAL
  Filled 2021-06-23: qty 2

## 2021-06-23 NOTE — ED Notes (Signed)
Patient discharged to home per MD order. Patient in stable condition, and deemed medically cleared by ED provider for discharge. Discharge instructions reviewed with patient/family using "Teach Back"; verbalized understanding of medication education and administration, and information about follow-up care. Denies further concerns. ° °

## 2021-06-23 NOTE — Discharge Instructions (Addendum)
You may alternate Tylenol 1000 mg every 6 hours as needed for pain, fever and Ibuprofen 800 mg every 8 hours as needed for pain, fever.  Please take Ibuprofen with food.  Do not take more than 4000 mg of Tylenol (acetaminophen) in a 24 hour period.  

## 2021-06-23 NOTE — ED Notes (Signed)
Pt in [place] at this time 

## 2021-06-24 LAB — URINE CULTURE: Culture: 1000 — AB

## 2021-07-21 ENCOUNTER — Other Ambulatory Visit: Payer: Self-pay

## 2021-10-21 ENCOUNTER — Emergency Department: Payer: Self-pay

## 2021-10-21 ENCOUNTER — Emergency Department
Admission: EM | Admit: 2021-10-21 | Discharge: 2021-10-22 | Disposition: A | Discharge status: Home / Self Care | Payer: Self-pay | Attending: Emergency Medicine | Admitting: Emergency Medicine

## 2021-10-21 ENCOUNTER — Other Ambulatory Visit: Payer: Self-pay

## 2021-10-21 DIAGNOSIS — R519 Headache, unspecified: Secondary | ICD-10-CM

## 2021-10-21 DIAGNOSIS — R079 Chest pain, unspecified: Secondary | ICD-10-CM

## 2021-10-21 DIAGNOSIS — Z794 Long term (current) use of insulin: Secondary | ICD-10-CM | POA: Insufficient documentation

## 2021-10-21 DIAGNOSIS — E1165 Type 2 diabetes mellitus with hyperglycemia: Secondary | ICD-10-CM | POA: Insufficient documentation

## 2021-10-21 DIAGNOSIS — Z79899 Other long term (current) drug therapy: Secondary | ICD-10-CM | POA: Insufficient documentation

## 2021-10-21 DIAGNOSIS — I10 Essential (primary) hypertension: Secondary | ICD-10-CM

## 2021-10-21 DIAGNOSIS — R200 Anesthesia of skin: Secondary | ICD-10-CM

## 2021-10-21 DIAGNOSIS — Z7984 Long term (current) use of oral hypoglycemic drugs: Secondary | ICD-10-CM | POA: Insufficient documentation

## 2021-10-21 DIAGNOSIS — N9489 Other specified conditions associated with female genital organs and menstrual cycle: Secondary | ICD-10-CM | POA: Insufficient documentation

## 2021-10-21 DIAGNOSIS — J45909 Unspecified asthma, uncomplicated: Secondary | ICD-10-CM | POA: Insufficient documentation

## 2021-10-21 LAB — CBC
HCT: 40.3 % (ref 36.0–46.0)
Hemoglobin: 13.4 g/dL (ref 12.0–15.0)
MCH: 27.7 pg (ref 26.0–34.0)
MCHC: 33.3 g/dL (ref 30.0–36.0)
MCV: 83.3 fL (ref 80.0–100.0)
Platelets: 276 10*3/uL (ref 150–400)
RBC: 4.84 MIL/uL (ref 3.87–5.11)
RDW: 12.2 % (ref 11.5–15.5)
WBC: 6.9 10*3/uL (ref 4.0–10.5)
nRBC: 0 % (ref 0.0–0.2)

## 2021-10-21 LAB — COMPREHENSIVE METABOLIC PANEL
ALT: 14 U/L (ref 0–44)
AST: 14 U/L — ABNORMAL LOW (ref 15–41)
Albumin: 3.5 g/dL (ref 3.5–5.0)
Alkaline Phosphatase: 37 U/L — ABNORMAL LOW (ref 38–126)
Anion gap: 9 (ref 5–15)
BUN: 15 mg/dL (ref 6–20)
CO2: 25 mmol/L (ref 22–32)
Calcium: 8.5 mg/dL — ABNORMAL LOW (ref 8.9–10.3)
Chloride: 100 mmol/L (ref 98–111)
Creatinine, Ser: 0.58 mg/dL (ref 0.44–1.00)
GFR, Estimated: >60 mL/min (ref >60)
Glucose, Bld: 252 mg/dL — ABNORMAL HIGH (ref 70–99)
Potassium: 4.1 mmol/L (ref 3.5–5.1)
Sodium: 134 mmol/L — ABNORMAL LOW (ref 135–145)
Total Bilirubin: 0.5 mg/dL (ref 0.3–1.2)
Total Protein: 6.5 g/dL (ref 6.5–8.1)

## 2021-10-21 LAB — POC URINE PREG, ED: Preg Test, Ur: NEGATIVE

## 2021-10-21 LAB — TROPONIN I (HIGH SENSITIVITY)
Troponin I (High Sensitivity): 4 ng/L (ref <18)
Troponin I (High Sensitivity): 5 ng/L (ref <18)

## 2021-10-21 MED ORDER — PROCHLORPERAZINE EDISYLATE 10 MG/2ML IJ SOLN
10.0000 mg | Freq: Once | INTRAMUSCULAR | Status: AC
Start: 2021-10-21 — End: 2021-10-22
  Administered 2021-10-22: 10 mg via INTRAVENOUS
  Filled 2021-10-21: qty 2

## 2021-10-21 MED ORDER — SODIUM CHLORIDE 0.9 % IV BOLUS (SEPSIS)
1000.0000 mL | Freq: Once | INTRAVENOUS | Status: AC
  Administered 2021-10-21: 1000 mL via INTRAVENOUS

## 2021-10-21 NOTE — ED Triage Notes (Signed)
Pt states has been hypertensive for several days. Pt states she has had tingling in her arms and headache. Pt states was on Wednesday 171/100, and today 165/105.

## 2021-10-21 NOTE — Discharge Instructions (Addendum)
Steps to find a Primary Care Provider (PCP):  Call [phone] or [phone] to access "[place] Find a Doctor Service."  2.  You may also go on the [place] website at [email or web address]  

## 2021-10-22 ENCOUNTER — Emergency Department: Payer: Self-pay

## 2021-10-22 LAB — HCG, QUANTITATIVE, PREGNANCY: hCG, Beta Chain, Quant, S: <1 m[IU]/mL (ref <5)

## 2021-10-22 MED ORDER — PROCHLORPERAZINE EDISYLATE 10 MG/2ML IJ SOLN
10.0000 mg | Freq: Once | INTRAMUSCULAR | Status: DC
  Filled 2021-10-22: qty 2

## 2021-10-22 NOTE — ED Provider Notes (Signed)
[facility] Provider Note    Event Date/Time   First MD Initiated Contact with Patient 10/21/21 2259     (approximate)   History   hyptertension   HPI  KIMYETTA FLOTT is a 36 y.o. female with history of hypertension in pregnancy, diabetes who presents to the emergency department with complaints of generalized headache for the past week.  States she has tried ibuprofen, Tylenol, Aleve, Excedrin without relief.  She states that she went to donate plasma this week and they told her her blood pressure was 171/100.  She went to [facility] today to have it checked and it was 165/105.  She is not on any blood pressure medication.  She tells me that she has never been on blood pressure medication before however in her chart it looks like she has been on 10 mg of lisinopril previously.  She states she had some right arm tingling today that she woke up with this morning.  No other numbness, weakness, vision changes.  Also reports she has had some intermittent central chest pain without radiation.  No shortness of breath.  No nausea or vomiting.  No dizziness or diaphoresis.  No history of PE or DVT.  No calf tenderness or calf swelling.  No previous history of migraines.   History provided by patient.    Past Medical History:  Diagnosis Date   Asthma    Diabetes mellitus without complication ([place])    Hypertension    Lactose intolerance     Past Surgical History:  Procedure Laterality Date   CESAREAN SECTION     TONSILLECTOMY      MEDICATIONS:  Prior to Admission medications   Medication Sig Start Date End Date Taking? Authorizing Provider  lisinopril (ZESTRIL) 10 MG tablet Take 1 tablet by mouth daily. 10/09/19  Yes [provider]  albuterol (VENTOLIN HFA) 108 (90 Base) MCG/ACT inhaler Inhale 2 puffs into the lungs every 6 (six) hours as needed for wheezing or shortness of breath. 12/22/20   Iloabachie, Chioma E, NP  Blood Glucose Monitoring Suppl (BLOOD  GLUCOSE MONITOR SYSTEM) w/Device KIT Use as directed to test blood glucose levels 12/22/20   Cammy Copa, RPH  glucose blood (RIGHTEST [phone] BLOOD GLUCOSE) test strip Use as directed to test blood glucose levels Patient taking differently: Use as directed to test blood glucose levels 12/22/20   Ellie Lunch K, [facility]  Insulin Glargine (BASAGLAR KWIKPEN) 100 UNIT/ML Inject 14 Units into the skin once daily at bedtime for 1 week, if fasting blood sugar is greater than 150 mg/dl increase to 17 units once daily at bedtime. 01/06/21   Iloabachie, Chioma E, NP  Insulin Pen Needle (COMFORT EZ PEN NEEDLES) 32G X 4 MM MISC Use as directed with insulin 12/22/20   Cammy Copa, RPH  metFORMIN (GLUCOPHAGE) 1000 MG tablet Take 1 tablet (1,000 mg total) by mouth 2 (two) times daily with a meal. 01/06/21   Iloabachie, Chioma E, NP  Rightest GL300 Lancets MISC Use as directed to test blood glucose levels 12/22/20   Cammy Copa, [facility]    Physical Exam   Triage Vital Signs: ED Triage Vitals  Enc Vitals Group     BP 10/21/21 1945 (!) 198/97     Pulse Rate 10/21/21 1945 92     Resp 10/21/21 1945 16     Temp 10/21/21 1953 98.7 F (37.1 C)     Temp Source 10/21/21 1945 Oral     SpO2  10/21/21 1945 100 %     Weight 10/21/21 1946 203 lb (92.1 kg)     Height 10/21/21 1946 5' 5" (1.651 m)     Head Circumference --      Peak Flow --      Pain Score 10/21/21 1946 8     Pain Loc --      Pain Edu? --      Excl. in [place]? --     Most recent vital signs: Vitals:   10/22/21 0200 10/22/21 0230  BP: 133/60 (!) 155/79  Pulse: 66 74  Resp:    Temp:    SpO2: 99% 97%    CONSTITUTIONAL: Alert and oriented and responds appropriately to questions. Well-appearing; well-nourished HEAD: Normocephalic, atraumatic EYES: Conjunctivae clear, pupils appear equal, sclera nonicteric ENT: normal nose; moist mucous membranes NECK: Supple, normal ROM CARD: RRR; S1 and S2 appreciated; no murmurs, no clicks, no rubs, no  gallops RESP: Normal chest excursion without splinting or tachypnea; breath sounds clear and equal bilaterally; no wheezes, no rhonchi, no rales, no hypoxia or respiratory distress, speaking full sentences ABD/GI: Normal bowel sounds; non-distended; soft, non-tender, no rebound, no guarding, no peritoneal signs BACK: The back appears normal EXT: Normal ROM in all joints; no deformity noted, no edema; no cyanosis SKIN: Normal color for age and race; warm; no rash on exposed skin NEURO: Moves all extremities equally, normal speech, strength 5/5 in left upper and lower extremities with slightly diminished strength in the right upper and lower extremities, cranial nerves II through XII intact, reports decreased sensation in the right arm compared to the left otherwise sensation to light touch intact diffusely PSYCH: The patient's mood and manner are appropriate.   ED Results / Procedures / Treatments   LABS: (all labs ordered are listed, but only abnormal results are displayed) Labs Reviewed  COMPREHENSIVE METABOLIC PANEL - Abnormal; Notable for the following components:      Result Value   Sodium 134 (*)    Glucose, Bld 252 (*)    Calcium 8.5 (*)    AST 14 (*)    Alkaline Phosphatase 37 (*)    All other components within normal limits  CBC  HCG, QUANTITATIVE, PREGNANCY  POC URINE PREG, ED  TROPONIN I (HIGH SENSITIVITY)  TROPONIN I (HIGH SENSITIVITY)     EKG:  EKG Interpretation  Date/Time:  Friday October 21 2021 19:53:22 EST Ventricular Rate:  84 PR Interval:  142 QRS Duration: 90 QT Interval:  366 QTC Calculation: 432 R Axis:   74 Text Interpretation: Normal sinus rhythm Cannot rule out Anterior infarct , age undetermined Abnormal ECG When compared with ECG of 28-May-2021 17:53, No significant change was found Confirmed by Pryor Curia [phone]) on 10/21/2021 11:36:24 PM         RADIOLOGY: My personal review and interpretation of imaging: Chest x-ray clear.  CT head  shows no intracranial abnormality.  I have personally reviewed all radiology reports.   DG Chest 2 View  Result Date: 10/21/2021 CLINICAL DATA:  Arm tingling.  Hypertension.  Headache. EXAM: CHEST - 2 VIEW COMPARISON:  08/13/2019 FINDINGS: Heart size is normal. Mediastinal shadows are normal. The lungs are clear. No infiltrate, collapse or effusion. No significant bone finding. IMPRESSION: No active cardiopulmonary disease. Electronically Signed   By: Nelson Chimes M.D.   On: 10/21/2021 20:08   CT HEAD WO CONTRAST (5MM)  Result Date: 10/21/2021 CLINICAL DATA:  Headache. EXAM: CT HEAD WITHOUT CONTRAST TECHNIQUE: Contiguous axial images were obtained  from the base of the skull through the vertex without intravenous contrast. RADIATION DOSE REDUCTION: This exam was performed according to the departmental dose-optimization program which includes automated exposure control, adjustment of the mA and/or kV according to patient size and/or use of iterative reconstruction technique. COMPARISON:  Head CT dated 03/02/2014. FINDINGS: Brain: The ventricles and sulci are appropriate size for the patient's age. The Megna-white matter discrimination is preserved. There is no acute intracranial hemorrhage. No mass effect or midline shift. No extra-axial fluid collection. Vascular: No hyperdense vessel or unexpected calcification. Skull: Normal. Negative for fracture or focal lesion. Sinuses/Orbits: There is mucoperiosteal thickening of paranasal sinuses with opacification of several ethmoid air cells and partial opacification of the right frontal sinus. No air-fluid level. The mastoid air cells are clear. Other: None IMPRESSION: 1. No acute intracranial pathology. 2. Paranasal sinus disease. Electronically Signed   By: Anner Crete M.D.   On: 10/21/2021 23:59   MR BRAIN WO CONTRAST  Result Date: 10/22/2021 CLINICAL DATA:  Initial evaluation for acute neuro deficit, stroke suspected. EXAM: MRI HEAD WITHOUT CONTRAST  TECHNIQUE: Multiplanar, multiecho pulse sequences of the brain and surrounding structures were obtained without intravenous contrast. COMPARISON:  Prior head CT from 10/21/2021. FINDINGS: Brain: Examination technically limited due to the patient's inability to tolerate the full length of the exam. Axial coronal DWI sequences and sagittal T1 weighted sequence only were performed. Additionally, the provided images are moderately degraded by motion artifact. No evidence for acute or subacute infarct. Danner-white matter differentiation grossly maintained. No visible areas of encephalomalacia to suggest chronic cortical infarction or other insult. Cerebral volume within normal limits. No visible mass lesion, mass effect or midline shift. Ventricles normal size without hydrocephalus. No extra-axial fluid collection. Vascular: Not well assessed on this limited exam. Skull and upper cervical spine: Grossly within normal limits on this limited exam. Sinuses/Orbits: Not well assessed on this limited exam. Other: None. IMPRESSION: 1. Technically limited exam due to the patient's inability to tolerate the full length of the study and motion artifact. 2. Grossly negative brain MRI. No acute intracranial infarct or other abnormality. Electronically Signed   By: Jeannine Boga M.D.   On: 10/22/2021 01:57     PROCEDURES:  Critical Care performed: No   CRITICAL CARE Performed by: Cyril Mourning Perlie Scheuring   Total critical care time: 0 minutes  Critical care time was exclusive of separately billable procedures and treating other patients.  Critical care was necessary to treat or prevent imminent or life-threatening deterioration.  Critical care was time spent personally by me on the following activities: development of treatment plan with patient and/or surrogate as well as nursing, discussions with consultants, evaluation of patient's response to treatment, examination of patient, obtaining history from patient or  surrogate, ordering and performing treatments and interventions, ordering and review of laboratory studies, ordering and review of radiographic studies, pulse oximetry and re-evaluation of patient's condition.   Marland Kitchen1-3 Lead EKG Interpretation Performed by: Aizah Gehlhausen, Delice Bison, DO Authorized by: Allyn Bartelson, Delice Bison, DO     Interpretation: normal     ECG rate:  66   ECG rate assessment: normal     Rhythm: sinus rhythm     Ectopy: none     Conduction: normal      IMPRESSION / MDM / ASSESSMENT AND PLAN / ED COURSE  I reviewed the triage vital signs and the nursing notes.    Patient here with complaints of headache, right arm tingling and chest pain in the setting of  hypertension.  On my evaluation patient is still having symptoms but her blood pressure is currently 143/81 prior to any intervention.  The patient is on the cardiac monitor to evaluate for evidence of arrhythmia and/or significant heart rate changes.   DIFFERENTIAL DIAGNOSIS (includes but not limited to):   Hypertensive urgency, hypertensive emergency, intracranial hemorrhage, CVA, complex migraine, CVT, ACS, PE, dissection, anxiety   PLAN: We will obtain CBC, BMP, troponin x2, chest x-ray, CT head.  Will give Compazine and IV fluids for symptomatic relief.  We will continue to closely monitor her blood pressure.  EKG is nonischemic.   MEDICATIONS GIVEN IN ED: Medications  prochlorperazine (COMPAZINE) injection 10 mg (10 mg Intravenous Given 10/22/21 0004)  sodium chloride 0.9 % bolus 1,000 mL (0 mLs Intravenous Stopped 10/22/21 0051)     ED COURSE: Patient's labs reassuring.  Normal hemoglobin.  Troponin x2 negative.  Normal electrolytes.  Mildly elevated glucose but not in DKA.  She has history of diabetes.  Pregnancy test negative.  Head CT reviewed by myself and radiologist and shows no intracranial hemorrhage.  Chest x-ray reviewed by myself and radiologist and shows no acute abnormality.  Patient reports headache completely  gone after Compazine and fluids.  Blood pressure now 126/70 but she states she is still having right upper extremity numbness and weakness that has not improved at all.  We will obtain MRI of the brain for further evaluation.   MRI brain unremarkable.  Patient was not able to sit through the entire procedure due to claustrophobia and refused to allow medication to be given to complete the rest of the examination.  We were able to however rule out acute stroke.  I feel she is safe to be discharged.  Again discussed with patient given her blood pressures have dropped down to the 120s over 70s without antihypertensives I do not feel comfortable prescribing these medications from the ED and have recommended that she follow-up with a primary care doctor to follow her blood pressure closely as an outpatient.  Her headache has still completely resolved.  She is not having chest pain and the rest of her work-up has been reassuring.  I do not feel she needs admission at this time given no sign of ACS, CVA, hypertensive emergency.   At this time, I do not feel there is any life-threatening condition present. I reviewed all nursing notes, vitals, pertinent previous records.  All lab and urine results, EKGs, imaging ordered have been independently reviewed and interpreted by myself.  I reviewed all available radiology reports from any imaging ordered this visit.  Based on my assessment, I feel the patient is safe to be discharged home without further emergent workup and can continue workup as an outpatient as needed. Discussed all findings, treatment plan as well as usual and customary return precautions with patient.  They verbalize understanding and are comfortable with this plan.  Outpatient follow-up has been provided as needed.  All questions have been answered.    CONSULTS: No admission needed at this time.   OUTSIDE RECORDS REVIEWED: Reviewed patient's last office visit on 10/09/2019.  Patient was prescribed  lisinopril at that time but she states she is no longer taking this medication.         FINAL CLINICAL IMPRESSION(S) / ED DIAGNOSES   Final diagnoses:  Hypertension, unspecified type  Generalized headache  Chest pain, unspecified type  Right upper extremity numbness     Rx / DC Orders   ED Discharge Orders  None        Note:  This document was prepared using Dragon voice recognition software and may include unintentional dictation errors.   Nancee Brownrigg, Delice Bison, DO 10/22/21 [phone]

## 2023-02-07 DIAGNOSIS — M720 Palmar fascial fibromatosis [Dupuytren]: Secondary | ICD-10-CM | POA: Insufficient documentation

## 2023-02-21 DIAGNOSIS — M79642 Pain in left hand: Secondary | ICD-10-CM | POA: Diagnosis not present

## 2023-02-21 DIAGNOSIS — M720 Palmar fascial fibromatosis [Dupuytren]: Secondary | ICD-10-CM | POA: Diagnosis not present

## 2023-02-21 DIAGNOSIS — M65341 Trigger finger, right ring finger: Secondary | ICD-10-CM | POA: Diagnosis not present

## 2023-02-21 DIAGNOSIS — M79641 Pain in right hand: Secondary | ICD-10-CM | POA: Diagnosis not present

## 2023-02-22 NOTE — Progress Notes (Signed)
BP 134/80   Pulse 90   Temp 97.8 F (36.6 C) (Oral)   Resp 18   Ht 5\' 5"  (1.651 m)   Wt 207 lb (93.9 kg)   LMP 02/21/2023   SpO2 98%   BMI 34.45 kg/m    Subjective:    Patient ID: Alexa Dunn, female    DOB: 10-Jan-1986, 37 y.o.   MRN: [id number]  HPI: Alexa Dunn is a 37 y.o. female  Chief Complaint  Patient presents with   Establish Care   Diabetes    No care for 5 years   Hypertension   Establish care: her last physical was more than 5 years.  Medical history includes DM, HTN, asthma.  Family history includes DM.  Health maintenance due for labs, pap.   Diabetes, Type 2: Reports she has been type 2 diabetic since she was 14.  She reports she does have retinopathy and some neuropathy in her feet.  She has been out of medications for 5 years.  Discussed starting ozempic.  Sample provided.  She denies any family history of thyroid cancer, or personal history of pancreatitis.  -Last A1c: 12 -Medications: not currently, on meds, previously on metformin, insulin basaglar, she reports that metformin did not work for her.   -Patient is compliant with the above medications and reports no side effects.  -Checking BG at home: she does not have a meter, would like to have a meter, will prove freestyle libre -Diet: work on reducing sugar and processed foods -Eye exam: due -Foot exam: due -Microalbumin: due -Statin: no -PNA vaccine: no -Denies symptoms of hypoglycemia, polyuria, polydipsia, numbness extremities, foot ulcers/trauma.    Hypertension:  -Medications: none, previously on lisinopril 10 mg daily, will restart -Patient is compliant with above medications and reports no side effects. -Checking BP at home (average): does not check blood pressure at home  -Denies any SOB, CP, vision changes, LE edema or symptoms of hypotension, she does report that she does get headaches when her blood pressure is high.  -Diet: recommend DASH diet     02/23/2023   10:06 AM 10/22/2021     2:30 AM 10/22/2021    2:00 AM  Vitals with BMI  Height 5\' 5"     Weight 207 lbs    BMI 34.45    Systolic 134 155 829  Diastolic 80 79 60  Pulse 90 74 66    Asthma:    -Asthma status: controlled -Current Treatments: asthma as needed -Satisfied with current treatment?: yes -Albuterol/rescue inhaler frequency: has not had to use at all this year -Limitation of activity: no -Current upper respiratory symptoms: no -Triggers:  heat -Aerochamber/spacer use: no -Visits to ER or Urgent Care in past year: no -Pneumovax: Not up to Date -Influenza: Not up to Date   Obesity:  Current weight : 207 lbs BMI: 34.45 Treatment Tried: lifestyle modification Comorbidities: DM, HTN, Asthma     02/23/2023   10:09 AM 12/22/2020    1:11 PM  Depression screen PHQ 2/9  Decreased Interest 0 0  Down, Depressed, Hopeless 0 0  PHQ - 2 Score 0 0    Relevant past medical, surgical, family and social history reviewed and updated as indicated. Interim medical history since our last visit reviewed. Allergies and medications reviewed and updated.  Review of Systems  Constitutional: Negative for fever or weight change.  Respiratory: Negative for cough and shortness of breath.   Cardiovascular: Negative for chest pain or palpitations.  Gastrointestinal: Negative  for abdominal pain, no bowel changes.  Musculoskeletal: Negative for gait problem or joint swelling.  Skin: Negative for rash.  Neurological: Negative for dizziness or headache.  No other specific complaints in a complete review of systems (except as listed in HPI above).      Objective:    BP 134/80   Pulse 90   Temp 97.8 F (36.6 C) (Oral)   Resp 18   Ht 5\' 5"  (1.651 m)   Wt 207 lb (93.9 kg)   LMP 02/21/2023   SpO2 98%   BMI 34.45 kg/m   Wt Readings from Last 3 Encounters:  02/23/23 207 lb (93.9 kg)  10/21/21 203 lb (92.1 kg)  06/22/21 201 lb (91.2 kg)    Physical Exam  Constitutional: Patient appears well-developed and  well-nourished. Obese  No distress.  HEENT: head atraumatic, normocephalic, pupils equal and reactive to light, neck supple, throat within normal limits Cardiovascular: Normal rate, regular rhythm and normal heart sounds.  No murmur heard. No BLE edema. Pulmonary/Chest: Effort normal and breath sounds normal. No respiratory distress. Abdominal: Soft.  There is no tenderness. Psychiatric: Patient has a normal mood and affect. behavior is normal. Judgment and thought content normal.   Diabetic Foot Exam - Simple   Simple Foot Form Diabetic Foot exam was performed with the following findings: Yes 02/23/2023 11:02 AM  Visual Inspection No deformities, no ulcerations, no other skin breakdown bilaterally: Yes Sensation Testing See comments: Yes Pulse Check Comments Left foot ( toes) had lack of touch with monofilament testing     Results for orders placed or performed in visit on 02/23/23  POCT HgB A1C  Result Value Ref Range   Hemoglobin A1C 12.0 (A) 4.0 - 5.6 %   HbA1c POC (<> result, manual entry)     HbA1c, POC (prediabetic range)     HbA1c, POC (controlled diabetic range)        Assessment & Plan:   Problem List Items Addressed This Visit       Cardiovascular and Mediastinum   Hypertension    Restart lisinopril 10 mg daily      Relevant Medications   lisinopril (ZESTRIL) 10 MG tablet   Other Relevant Orders   CBC with Differential/Platelet   COMPLETE METABOLIC PANEL WITH GFR     Respiratory   Asthma    Refill of albuterol inhaler.  Stable condition      Relevant Medications   albuterol (VENTOLIN HFA) 108 (90 Base) MCG/ACT inhaler     Endocrine   Diabetes mellitus without complication (HCC) - Primary    Start ozempic 0.25 mg weekly,  sample provided.  Patient given sample of freestyle libre, and prescription sent in.        Relevant Medications   Continuous Glucose Sensor (FREESTYLE LIBRE 3 SENSOR) MISC   Semaglutide,0.25 or 0.5MG /DOS, (OZEMPIC, 0.25 OR 0.5  MG/DOSE,) 2 MG/3ML SOPN   lisinopril (ZESTRIL) 10 MG tablet     Other   Class 1 obesity due to excess calories with serious comorbidity and body mass index (BMI) of 34.0 to 34.9 in adult    Continue working on lifestyle modification. Patient also starting ozempic for diabetes.      Relevant Medications   Semaglutide,0.25 or 0.5MG /DOS, (OZEMPIC, 0.25 OR 0.5 MG/DOSE,) 2 MG/3ML SOPN   Other Visit Diagnoses     Need for Tdap vaccination       Relevant Orders   Tdap vaccine greater than or equal to 7yo IM (Completed)   Encounter  for hepatitis C screening test for low risk patient       Relevant Orders   Hepatitis C antibody   Screening for HIV without presence of risk factors       Relevant Orders   HIV Antibody (routine testing w rflx)   Screening for deficiency anemia       Relevant Orders   CBC with Differential/Platelet   Screening for cholesterol level       Relevant Orders   Lipid panel        Follow up plan: Return in about 3 months (around 05/26/2023) for cpe appointment, follow up.

## 2023-02-23 ENCOUNTER — Other Ambulatory Visit: Payer: Self-pay

## 2023-02-23 ENCOUNTER — Encounter: Payer: Self-pay | Admitting: Nurse Practitioner

## 2023-02-23 ENCOUNTER — Ambulatory Visit: Payer: Medicaid Other | Admitting: Nurse Practitioner

## 2023-02-23 VITALS — BP 134/80 | HR 90 | Temp 97.8°F | Resp 18 | Ht 65.0 in | Wt 207.0 lb

## 2023-02-23 DIAGNOSIS — Z13220 Encounter for screening for lipoid disorders: Secondary | ICD-10-CM

## 2023-02-23 DIAGNOSIS — Z6834 Body mass index (BMI) 34.0-34.9, adult: Secondary | ICD-10-CM

## 2023-02-23 DIAGNOSIS — Z1159 Encounter for screening for other viral diseases: Secondary | ICD-10-CM

## 2023-02-23 DIAGNOSIS — Z794 Long term (current) use of insulin: Secondary | ICD-10-CM

## 2023-02-23 DIAGNOSIS — Z114 Encounter for screening for human immunodeficiency virus [HIV]: Secondary | ICD-10-CM | POA: Diagnosis not present

## 2023-02-23 DIAGNOSIS — Z130 Encounter for screening for diseases of the blood and blood-forming organs and certain disorders involving the immune mechanism: Secondary | ICD-10-CM | POA: Diagnosis not present

## 2023-02-23 DIAGNOSIS — Z23 Encounter for immunization: Secondary | ICD-10-CM

## 2023-02-23 DIAGNOSIS — J4520 Mild intermittent asthma, uncomplicated: Secondary | ICD-10-CM | POA: Diagnosis not present

## 2023-02-23 DIAGNOSIS — E113392 Type 2 diabetes mellitus with moderate nonproliferative diabetic retinopathy without macular edema, left eye: Secondary | ICD-10-CM | POA: Diagnosis not present

## 2023-02-23 DIAGNOSIS — I10 Essential (primary) hypertension: Secondary | ICD-10-CM

## 2023-02-23 DIAGNOSIS — E6609 Other obesity due to excess calories: Secondary | ICD-10-CM | POA: Diagnosis not present

## 2023-02-23 DIAGNOSIS — E119 Type 2 diabetes mellitus without complications: Secondary | ICD-10-CM | POA: Diagnosis not present

## 2023-02-23 LAB — POCT GLYCOSYLATED HEMOGLOBIN (HGB A1C): Hemoglobin A1C: 12 % — AB (ref 4.0–5.6)

## 2023-02-23 MED ORDER — OZEMPIC (0.25 OR 0.5 MG/DOSE) 2 MG/3ML ~~LOC~~ SOPN: 0.2500 mg | PEN_INJECTOR | SUBCUTANEOUS | 0 refills | Status: DC

## 2023-02-23 MED ORDER — ALBUTEROL SULFATE HFA 108 (90 BASE) MCG/ACT IN AERS: 2.0000 | INHALATION_SPRAY | Freq: Four times a day (QID) | RESPIRATORY_TRACT | 3 refills | Status: AC | PRN

## 2023-02-23 MED ORDER — FREESTYLE LIBRE 3 READER DEVI: 1.0000 | Freq: Once | 0 refills | Status: DC

## 2023-02-23 MED ORDER — LISINOPRIL 10 MG PO TABS: 10.0000 mg | ORAL_TABLET | Freq: Every day | ORAL | 3 refills | Status: AC

## 2023-02-23 MED ORDER — FREESTYLE LIBRE 3 SENSOR MISC: 1.0000 | 5 refills | Status: AC

## 2023-02-23 NOTE — Assessment & Plan Note (Signed)
Refill of albuterol inhaler.  Stable condition

## 2023-02-23 NOTE — Assessment & Plan Note (Signed)
Start ozempic 0.25 mg weekly,  sample provided.  Patient given sample of freestyle libre, and prescription sent in.

## 2023-02-23 NOTE — Addendum Note (Signed)
Addended by: Benay Pike on: 02/23/2023 11:11 AM   Modules accepted: Orders

## 2023-02-23 NOTE — Assessment & Plan Note (Signed)
Restart lisinopril 10 mg daily. 

## 2023-02-23 NOTE — Assessment & Plan Note (Signed)
Continue working on lifestyle modification. Patient also starting ozempic for diabetes.

## 2023-02-24 LAB — CBC WITH DIFFERENTIAL/PLATELET
Absolute Monocytes: 521 cells/uL (ref 200–950)
Basophils Absolute: 47 cells/uL (ref 0–200)
Basophils Relative: 0.5 %
Eosinophils Absolute: 112 cells/uL (ref 15–500)
Eosinophils Relative: 1.2 %
HCT: 40.7 % (ref 35.0–45.0)
Hemoglobin: 13.1 g/dL (ref 11.7–15.5)
Lymphs Abs: 2492 cells/uL (ref 850–3900)
MCH: 27.1 pg (ref 27.0–33.0)
MCHC: 32.2 g/dL (ref 32.0–36.0)
MCV: 84.1 fL (ref 80.0–100.0)
MPV: 9.2 fL (ref 7.5–12.5)
Monocytes Relative: 5.6 %
Neutro Abs: 6129 cells/uL (ref 1500–7800)
Neutrophils Relative %: 65.9 %
Platelets: 305 10*3/uL (ref 140–400)
RBC: 4.84 10*6/uL (ref 3.80–5.10)
RDW: 12.6 % (ref 11.0–15.0)
Total Lymphocyte: 26.8 %
WBC: 9.3 10*3/uL (ref 3.8–10.8)

## 2023-02-24 LAB — COMPLETE METABOLIC PANEL WITH GFR
AG Ratio: 1.3 (calc) (ref 1.0–2.5)
ALT: 17 U/L (ref 6–29)
AST: 15 U/L (ref 10–30)
Albumin: 4 g/dL (ref 3.6–5.1)
Alkaline phosphatase (APISO): 47 U/L (ref 31–125)
BUN: 19 mg/dL (ref 7–25)
CO2: 26 mmol/L (ref 20–32)
Calcium: 9.5 mg/dL (ref 8.6–10.2)
Chloride: 97 mmol/L — ABNORMAL LOW (ref 98–110)
Creat: 0.83 mg/dL (ref 0.50–0.97)
Globulin: 3 g/dL (calc) (ref 1.9–3.7)
Glucose, Bld: 376 mg/dL — ABNORMAL HIGH (ref 65–99)
Potassium: 4.5 mmol/L (ref 3.5–5.3)
Sodium: 133 mmol/L — ABNORMAL LOW (ref 135–146)
Total Bilirubin: 0.3 mg/dL (ref 0.2–1.2)
Total Protein: 7 g/dL (ref 6.1–8.1)
eGFR: 95 mL/min/{1.73_m2} (ref >=60)

## 2023-02-24 LAB — LIPID PANEL
Cholesterol: 185 mg/dL (ref <200)
HDL: 53 mg/dL (ref >=50)
LDL Cholesterol (Calc): 93 mg/dL (calc)
Non-HDL Cholesterol (Calc): 132 mg/dL (calc) — ABNORMAL HIGH (ref <130)
Total CHOL/HDL Ratio: 3.5 (calc) (ref <5.0)
Triglycerides: 294 mg/dL — ABNORMAL HIGH (ref <150)

## 2023-02-24 LAB — MICROALBUMIN / CREATININE URINE RATIO
Creatinine, Urine: 31 mg/dL (ref 20–275)
Microalb Creat Ratio: 4945 mg/g creat — ABNORMAL HIGH (ref <30)
Microalb, Ur: 153.3 mg/dL

## 2023-02-24 LAB — HIV ANTIBODY (ROUTINE TESTING W REFLEX): HIV 1&2 Ab, 4th Generation: NONREACTIVE

## 2023-02-24 LAB — HEPATITIS C ANTIBODY: Hepatitis C Ab: NONREACTIVE

## 2023-02-25 ENCOUNTER — Other Ambulatory Visit: Payer: Self-pay | Admitting: Nurse Practitioner

## 2023-02-25 DIAGNOSIS — R809 Proteinuria, unspecified: Secondary | ICD-10-CM

## 2023-02-25 DIAGNOSIS — E113392 Type 2 diabetes mellitus with moderate nonproliferative diabetic retinopathy without macular edema, left eye: Secondary | ICD-10-CM

## 2023-02-25 MED ORDER — EMPAGLIFLOZIN 10 MG PO TABS: 10.0000 mg | ORAL_TABLET | Freq: Every day | ORAL | 1 refills | Status: DC

## 2023-02-26 ENCOUNTER — Telehealth: Payer: Self-pay | Admitting: Emergency Medicine

## 2023-02-26 ENCOUNTER — Telehealth: Payer: Self-pay | Admitting: Nurse Practitioner

## 2023-02-26 NOTE — Telephone Encounter (Signed)
Pa has been sent for Ozempic and Freestyle

## 2023-02-26 NOTE — Telephone Encounter (Signed)
Pt states that she is needing a Prior Authorization for  Semaglutide,0.25 or 0.5MG /DOS, (OZEMPIC, 0.25 OR 0.5 MG/DOSE,) 2 MG/3ML SOPN   Continuous Glucose Sensor (FREESTYLE LIBRE 3 SENSOR) MISC   Pt would like a call back to discuss  [phone].

## 2023-02-26 NOTE — Telephone Encounter (Signed)
PA sent today for Jardiance 10mg 

## 2023-02-27 ENCOUNTER — Ambulatory Visit: Payer: Self-pay

## 2023-02-27 DIAGNOSIS — M79642 Pain in left hand: Secondary | ICD-10-CM | POA: Diagnosis not present

## 2023-02-27 DIAGNOSIS — M79641 Pain in right hand: Secondary | ICD-10-CM | POA: Diagnosis not present

## 2023-02-27 DIAGNOSIS — M720 Palmar fascial fibromatosis [Dupuytren]: Secondary | ICD-10-CM | POA: Diagnosis not present

## 2023-02-27 LAB — HM DIABETES EYE EXAM

## 2023-02-27 NOTE — Telephone Encounter (Signed)
  Chief Complaint: hyperglycemia Symptoms: CBGs in 300s all day, now 220 but not eaten, fatigue Frequency: been staying elevated Pertinent Negatives: Patient denies nausea, dizziness, SOB Disposition: [] ED /[] Urgent Care (no appt availability in office) / [] Appointment(In office/virtual)/ []  [facility]/ [] Home Care/ [] Refused Recommended Disposition /[] [facility]/ [x]  Follow-up with PCP Additional Notes: pt had OV 02/23/23 to establish care and has gotten alerts from [facility] today for high CBG, readings have been in 300s and pt concerned since she was taking insulin before hand. Pt took Ozempic dose Friday. Advised pt I would send to provider for review and have then FU with her would probably be tomorrow since office was getting ready to close. Pt verbalized understanding  Summary: Pt blood sugar has been running 250-300   Pt reports that her blood sugar has been running 250-300 and she is concerned. Pt requests call back to discuss. Cb# [phone]         Reason for Disposition  [1] Blood glucose > 300 mg/dL (16.1 mmol/L) AND [0] two or more times in a row  Answer Assessment - Initial Assessment Questions 1. BLOOD GLUCOSE: "What is your blood glucose level?"      7am 300 been in 300 all day, 220 now  2. ONSET: "When did you check the blood glucose?"     Today  5. TYPE 1 or 2:  "Do you know what type of diabetes you have?"  (e.g., Type 1, Type 2, Gestational; doesn't know)      DM 2  6. INSULIN: "Do you take insulin?" "What type of insulin(s) do you use? What is the mode of delivery? (syringe, pen; injection or pump)?"      Ozempic last Friday 02/23/23 7. DIABETES PILLS: "Do you take any pills for your diabetes?" If Yes, ask: "Have you missed taking any pills recently?"     no 8. OTHER SYMPTOMS: "Do you have any symptoms?" (e.g., fever, frequent urination, difficulty breathing, dizziness, weakness, vomiting) Fatigued  Protocols used: Diabetes - High Blood  Sugar-A-AH

## 2023-02-27 NOTE — Telephone Encounter (Signed)
Patient called, left VM to return the call to the office to speak to the NT.    Summary: Pt blood sugar has been running 250-300   Pt reports that her blood sugar has been running 250-300 and she is concerned. Pt requests call back to discuss. Cb# [phone]

## 2023-02-28 ENCOUNTER — Encounter: Payer: Self-pay | Admitting: Nurse Practitioner

## 2023-02-28 ENCOUNTER — Encounter: Payer: Self-pay | Admitting: Family Medicine

## 2023-02-28 ENCOUNTER — Ambulatory Visit: Payer: Medicaid Other | Admitting: Family Medicine

## 2023-02-28 VITALS — BP 136/74 | HR 98 | Temp 98.0°F | Resp 16 | Ht 65.0 in | Wt 202.6 lb

## 2023-02-28 DIAGNOSIS — M2550 Pain in unspecified joint: Secondary | ICD-10-CM | POA: Diagnosis not present

## 2023-02-28 DIAGNOSIS — Z794 Long term (current) use of insulin: Secondary | ICD-10-CM | POA: Diagnosis not present

## 2023-02-28 DIAGNOSIS — E1165 Type 2 diabetes mellitus with hyperglycemia: Secondary | ICD-10-CM

## 2023-02-28 LAB — GLUCOSE, POCT (MANUAL RESULT ENTRY): POC Glucose: 230 mg/dl — AB (ref 70–99)

## 2023-02-28 MED ORDER — BLOOD GLUCOSE MONITORING SUPPL DEVI: 1.0000 | Freq: Three times a day (TID) | 0 refills | Status: AC

## 2023-02-28 MED ORDER — INSULIN GLARGINE 100 UNIT/ML SOLOSTAR PEN: 10.0000 [IU] | PEN_INJECTOR | Freq: Every day | SUBCUTANEOUS | 2 refills | Status: AC

## 2023-02-28 MED ORDER — LANCETS MISC. MISC: 1.0000 | Freq: Three times a day (TID) | 5 refills | Status: AC | PRN

## 2023-02-28 MED ORDER — BLOOD GLUCOSE TEST VI STRP: 1.0000 | ORAL_STRIP | Freq: Three times a day (TID) | 5 refills | Status: AC | PRN

## 2023-02-28 NOTE — Progress Notes (Signed)
Patient ID: LILU KIRST, female    DOB: Dec 21, 1985, 37 y.o.   MRN: [id number]  PCP: Berniece Salines, FNP  Chief Complaint  Patient presents with   Follow-up   Diabetes    Elevated BS, has not ate today [id number]    Subjective:   Alexa Dunn is a 37 y.o. female, presents to clinic with CC of the following:  HPI  Hyperglycemia with known DM Lab Results  Component Value Date   HGBA1C 12.0 (A) 02/23/2023  Appt with PCP last week to establish care she just started ozempic 0.25 New freestyle libre - prior to getting she was not checking sugars, DM since age of 68 with prior insulin dependence She reports she stopped otc cash pay insulin not sure the kind - buying from [employer] and was doing 17 units 2x a day stopped that about 4 months ago Since using libre meter there was only one time too high on libre to read, daily charts/graphs show Lowest sugars are 200, Highs 300-400 multiple spikes a day No glucometer  She came in today because without eating her sugars have remained 260's  She went to ortho and they suggested she do autoimmune/rheumatic work up - she did some labs with ortho, she has joint pain multiple places left shoulder, hands/fingers She will send in lab results from specialists through a mychart msg        Patient Active Problem List   Diagnosis Date Noted   Dupuytren's disease of palm 02/07/2023   Diabetes mellitus without complication (HCC)    Asthma    Left genital labial abscess 07/22/2016   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 Place 1 sensor on the skin every 14 days. Use to check glucose continuously, Disp: 2 each, Rfl: 5   empagliflozin (JARDIANCE) 10 MG TABS tablet, Take 1 tablet (10 mg total) by mouth daily before breakfast., Disp: 90 tablet, Rfl: 1   lisinopril (ZESTRIL) 10 MG tablet, Take 1 tablet (10 mg total) by mouth daily., Disp: 90 tablet, Rfl: 3   Semaglutide,0.25 or 0.5MG /DOS, (OZEMPIC, 0.25 OR 0.5 MG/DOSE,) 2 MG/3ML SOPN, Inject 0.25 mg into the skin once a week., Disp: 3 mL, Rfl: 0   Continuous Glucose Receiver (FREESTYLE LIBRE 3 READER) DEVI, 1 each by Does not apply route once for 1 dose., Disp: 1 each, Rfl: 0   Allergies  Allergen Reactions   Penicillins Hives and Rash    Other reaction(s): UNKNOWN     Social History   Tobacco Use   Smoking status: Former    Packs/day: 0.50    Years: 16.00    Additional pack years: 0.00    Total pack years: 8.00    Types: Cigarettes    Quit date: 06/28/2022    Years since quitting: 0.6   Smokeless tobacco: Never  Vaping Use   Vaping Use: Former  Substance Use Topics   Alcohol use: No   Drug use: Yes    Types:  Marijuana    Comment: 2 times per month      Chart Review Today: I personally reviewed active problem list, medication list, allergies, family history, social history, health maintenance, notes from last encounter, lab results, imaging with the patient/caregiver today.   Review of Systems  Constitutional: Negative.   HENT: Negative.    Eyes: Negative.   Respiratory: Negative.    Cardiovascular: Negative.   Gastrointestinal: Negative.   Endocrine: Negative.   Genitourinary: Negative.   Musculoskeletal: Negative.   Skin: Negative.   Allergic/Immunologic: Negative.   Neurological: Negative.   Hematological: Negative.   Psychiatric/Behavioral: Negative.    All other systems reviewed and are negative.      Objective:   Vitals:   02/28/23 1247  BP: 136/74  Pulse: 98  Resp: 16  Temp:  98 F (36.7 C)  TempSrc: Oral  SpO2: 98%  Weight: 202 lb 9.6 oz (91.9 kg)  Height: 5\' 5"  (1.651 m)    Body mass index is 33.71 kg/m.  Physical Exam Vitals and nursing note reviewed.  Constitutional:      General: She is not in acute distress.    Appearance: Normal appearance. She is well-developed. She is obese. She is not ill-appearing, toxic-appearing or diaphoretic.  HENT:     Head: Normocephalic and atraumatic.     Nose: Nose normal.  Eyes:     General:        Right eye: No discharge.        Left eye: No discharge.     Conjunctiva/sclera: Conjunctivae normal.  Neck:     Trachea: No tracheal deviation.  Cardiovascular:     Rate and Rhythm: Normal rate and regular rhythm.     Pulses: Normal pulses.     Heart sounds: Normal heart sounds.  Pulmonary:     Effort: Pulmonary effort is normal. No respiratory distress.     Breath sounds: Normal breath sounds. No stridor.  Musculoskeletal:        General: Normal range of motion.  Skin:    General: Skin is warm and dry.     Findings: No rash.  Neurological:     Mental Status: She is alert.     Motor: No abnormal muscle tone.     Coordination: Coordination normal.  Psychiatric:        Behavior: Behavior normal.      Results for orders placed or performed in visit on 02/28/23  HM DIABETES EYE EXAM  Result Value Ref Range   HM Diabetic Eye Exam Retinopathy (A) No Retinopathy       Assessment & Plan:   1. Uncontrolled diabetes mellitus with hyperglycemia, with long-term current use of insulin (HCC) Pt presented for hyperglycemia Results for orders placed or performed in visit on 02/28/23  POCT glucose (manual entry)  Result Value Ref Range   POC Glucose 230 (A) 70 - 99 mg/dl  She has not eaten today, her libre 3 has shown daily trends with lowest fasting CBGs 200 and spikes to 300-400, one instance with error due to too high unable to read. She just got and started ozempic - 0.25 mg dose - no SE, only got about 3 d  ago She was previously on cash pay insulin from [facility] - she doesn't know the kind or brand but was giving herself 17 units BID up until about 4 months ago Glucometer sent in so she can compare CBGs to [facility] esp for any highs or lows and can calibrate [employer]  Will add basal insulin to her ozempic - weight based dosing start at 15 units and titrate per instructions to morning fasting blood sugars 100 to <150 Recommended f/up in 1-2 weeks to review sugars and insulin dosing As she increases ozempic dose gradually over the next several months I expect her to need to lower insulin dose as well All her labs were reviewed from last week - good renal function She does not have any acute changes/sx/concerns Encouraged her to continue to avoid sugary drinks/sweets or large amounts of simple sugars/carbs in diet She appeared well hydrated, VSS  - POCT glucose (manual entry) - insulin glargine (LANTUS) 100 UNIT/ML Solostar Pen; Inject 10-40 Units into the skin daily.  Dispense: 30 mL; Refill: 2 - Blood Glucose Monitoring Suppl DEVI; 1 each by Does not apply route in the morning, at noon, and at bedtime. May substitute to any manufacturer covered by patient's insurance.  Dispense: 1 each; Refill: 0 - Glucose Blood (BLOOD GLUCOSE TEST STRIPS) STRP; 1 each by In Vitro route 3 (three) times daily as needed (blood sugar monitoring). May substitute to any manufacturer covered by patient's insurance.  Dispense: 100 strip; Refill: 5 - Lancets Misc. MISC; 1 each by Does not apply route 3 (three) times daily as needed (blood sugar monitoring). May substitute to any manufacturer covered by patient's insurance.  Dispense: 100 each; Refill: 5  2. Polyarthralgia Hands, fingers, left shoulder - she saw ortho and they asked her to f/up with PCP for autoimmune/rheumatic work up She did show me labs on report on phone - it appeared to have most labs I would do - ANA, CRP, Rh factor and uric acid - all normal - I showed  her how to send [place] the labs through mychart Her PCP will need to review and abstract and I'm unsure what other labs would be helpful  Pt will discuss with PCP at upcoming appt   Return for 1-2 week insulin/dm f/up visit, in person or virtual ok.    Danelle Berry, PA-C 02/28/23 1:11 PM

## 2023-02-28 NOTE — Telephone Encounter (Signed)
Spoke with patient to see if she could come in this morning to see Dr. Caralee Ates, but patient stated that she had other appointments this morning and asked if she could come in the afternoon on today, 02/28/23.  I scheduled patient to see Danelle Berry, PA at 1pm on today.

## 2023-02-28 NOTE — Patient Instructions (Signed)
Start once daily basal (long-lasting) insulin once daily I would start with 15 unit dose daily at bedtime  Long Acting Insulin:  You will begin with 15 units, administer daily in evening (same time of day if able).  Begin checking your fasting blood sugar every morning - keep record on log or make sure always bring meter with you to appointments.  Goal range for fasting blood sugars:  80-150 - keep insulin dose the same    >150 - Increase insulin dose by 2-3 units and do not adjust again for 3 days    <80 or under 100 and feeling symptomatic - decrease insulin dose by 2-3 units and do not adjust again for 3 days  Example of how to titrate:  Fasting blood sugars around 180's every morning and I am taking 18 units daily Increase dose to 20 or 21, give at the same time for the next 3 days, and continue to watch morning fasting blood sugars. If after 3 days the blood sugar has not come into normal range, then can increase again, + 2 or 3 = 23 or 24 units, and then give 24 units daily for the next 3-4 days and continue to monitor the fasting blood sugars.  Please reach out to me and let me know when you get to fasting blood sugars in goal range and what dose. We will record the dose in your chart, and keep you at that dose.  Follow up in 2-4 weeks. Follow up sooner if blood sugar too high or too low. Bring blood sugar log or glucometer with you to office visit.    When you change your ozempic dose over the next couple months you may need to start to decrease your insulin dose or eventually get off insulin. Some people will decrease their insulin dose by 5 units when they increase their ozempic dose and then you will anticipate high sugars for a few days and adjust as needed. Some will do opposite.  Keep the insulin dose the same and then adjust dose down as you have low sugars.  You can do which way you are more comfortable with, but we are always here to do visits to review your sugars  and help you adjust your med doses.

## 2023-03-12 ENCOUNTER — Encounter: Payer: Self-pay | Admitting: Nurse Practitioner

## 2023-03-14 ENCOUNTER — Telehealth (INDEPENDENT_AMBULATORY_CARE_PROVIDER_SITE_OTHER): Payer: Medicaid Other | Admitting: Nurse Practitioner

## 2023-03-14 ENCOUNTER — Telehealth: Payer: Self-pay

## 2023-03-14 DIAGNOSIS — E1165 Type 2 diabetes mellitus with hyperglycemia: Secondary | ICD-10-CM | POA: Diagnosis not present

## 2023-03-14 DIAGNOSIS — Z7985 Long-term (current) use of injectable non-insulin antidiabetic drugs: Secondary | ICD-10-CM

## 2023-03-14 DIAGNOSIS — Z794 Long term (current) use of insulin: Secondary | ICD-10-CM | POA: Diagnosis not present

## 2023-03-14 DIAGNOSIS — Z7984 Long term (current) use of oral hypoglycemic drugs: Secondary | ICD-10-CM

## 2023-03-14 MED ORDER — TRULICITY 0.75 MG/0.5ML ~~LOC~~ SOAJ: 0.7500 mg | SUBCUTANEOUS | 0 refills | Status: DC

## 2023-03-14 NOTE — Assessment & Plan Note (Addendum)
Continue taking ozempic weekly, jardiance 10 mg daily and lantus 17 units daily.  She reports that insurance does not cover the ozempic will send in for trulicity.

## 2023-03-14 NOTE — Progress Notes (Signed)
Name: Alexa Dunn   MRN: [id number]    DOB: 03/27/1986   Date:03/14/2023       Progress Note  Subjective  Chief Complaint  Chief Complaint  Patient presents with   Follow-up   Diabetes    BS have been under control    I connected with  [name]  on 03/14/23 at 1:00 pm by a video enabled telemedicine application and verified that I am speaking with the correct person using two identifiers.  I discussed the limitations of evaluation and management by telemedicine and the availability of in person appointments. The patient expressed understanding and agreed to proceed with the virtual visit  Staff also discussed with the patient that there may be a patient responsible charge related to this service. Patient Location: home Provider Location: [facility] Additional Individuals present: alone  HPI  Diabetes, Type 2:  -Last A1c 12.0 -Medications: jardiance 10 mg daily, ozempic 0.25 mg weekly, and lantus 17 units daily She reports that her insurance does not cover ozempic. Will send in trulicity.  -Patient is compliant with the above medications and reports sour stomach with the ozempic -Checking BG at home: yes -Fasting home BG: 122 -Eye exam: utd -Foot exam: utd -Microalbumin: utd -Statin: no -Denies symptoms of hypoglycemia, polyuria, polydipsia, numbness extremities, foot ulcers/trauma.   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   Hypertension 12/26/2012   Class 1 obesity due to excess calories with serious comorbidity and body mass index (BMI) of 34.0 to 34.9 in adult 12/26/2012    Past Surgical History:  Procedure Laterality Date   CESAREAN SECTION     TONSILLECTOMY      Family History  Problem Relation Age of Onset   Healthy Mother    Diabetes  Father    COPD Father    Heart disease Father    Vitamin D deficiency Daughter    Arthritis Maternal Aunt    Meniere's disease Sister    Blindness Paternal Grandmother    COPD Paternal Grandfather     Social History   Socioeconomic History   Marital status: Single    Spouse name: Not on file   Number of children: 3   Years of education: Not on file   Highest education level: Not on file  Occupational History   Not on file  Tobacco Use   Smoking status: Former    Packs/day: 0.50    Years: 16.00    Additional pack years: 0.00    Total pack years: 8.00    Types: Cigarettes    Quit date: 06/28/2022    Years since quitting: 0.7   Smokeless tobacco: Never  Vaping Use   Vaping Use: Former  Substance and Sexual Activity   Alcohol use: No   Drug use: Yes    Types: Marijuana    Comment: 2 times per month   Sexual activity: Yes    Birth control/protection: Implant    Comment: Nexplanon placed 2014  Other Topics Concern   Not on file  Social History Narrative   Not on file   Social Determinants of Health   Financial Resource Strain: Not on file  Food Insecurity: No Food Insecurity (01/06/2021)   Hunger Vital Sign    Worried About Running Out  of Food in the Last Year: Never true    Ran Out of Food in the Last Year: Never true  Transportation Needs: No Transportation Needs (01/06/2021)   PRAPARE - Administrator, Civil Service (Medical): No    Lack of Transportation (Non-Medical): No  Physical Activity: Not on file  Stress: Not on file  Social Connections: Not on file  Intimate Partner Violence: Not At Risk (01/06/2021)   Humiliation, Afraid, Rape, and Kick questionnaire    Fear of Current or Ex-Partner: No    Emotionally Abused: No    Physically Abused: No    Sexually Abused: No     Current Outpatient Medications:    albuterol (VENTOLIN HFA) 108 (90 Base) MCG/ACT inhaler, Inhale 2 puffs into the lungs every 6 (six) hours as needed for wheezing or  shortness of breath., Disp: 6.7 g, Rfl: 3   Blood Glucose Monitoring Suppl DEVI, 1 each by Does not apply route in the morning, at noon, and at bedtime. May substitute to any manufacturer covered by patient's insurance., Disp: 1 each, Rfl: 0   Continuous Glucose Sensor (FREESTYLE LIBRE 3 SENSOR) MISC, 1 each by Does not apply route every 14 (fourteen) days. Place 1 sensor on the skin every 14 days. Use to check glucose continuously, Disp: 2 each, Rfl: 5   Dulaglutide (TRULICITY) 0.75 MG/0.5ML SOPN, Inject 0.75 mg into the skin once a week., Disp: 2 mL, Rfl: 0   empagliflozin (JARDIANCE) 10 MG TABS tablet, Take 1 tablet (10 mg total) by mouth daily before breakfast., Disp: 90 tablet, Rfl: 1   Glucose Blood (BLOOD GLUCOSE TEST STRIPS) STRP, 1 each by In Vitro route 3 (three) times daily as needed (blood sugar monitoring). May substitute to any manufacturer covered by patient's insurance., Disp: 100 strip, Rfl: 5   insulin glargine (LANTUS) 100 UNIT/ML Solostar Pen, Inject 10-40 Units into the skin daily., Disp: 30 mL, Rfl: 2   Lancets Misc. MISC, 1 each by Does not apply route 3 (three) times daily as needed (blood sugar monitoring). May substitute to any manufacturer covered by patient's insurance., Disp: 100 each, Rfl: 5   lisinopril (ZESTRIL) 10 MG tablet, Take 1 tablet (10 mg total) by mouth daily., Disp: 90 tablet, Rfl: 3   Continuous Glucose Receiver (FREESTYLE LIBRE 3 READER) DEVI, 1 each by Does not apply route once for 1 dose., Disp: 1 each, Rfl: 0  Allergies  Allergen Reactions   Penicillins Hives and Rash    Other reaction(s): UNKNOWN    I personally reviewed active problem list, medication list, allergies, notes from last encounter with the patient/caregiver today.   ROS Constitutional: Negative for fever or weight change.  Respiratory: Negative for cough and shortness of breath.   Cardiovascular: Negative for chest pain or palpitations.  Gastrointestinal: Negative for abdominal  pain, no bowel changes.  Musculoskeletal: Negative for gait problem or joint swelling.  Skin: Negative for rash.  Neurological: Negative for dizziness or headache.  No other specific complaints in a complete review of systems (except as listed in HPI above).   Objective  Virtual encounter, vitals not obtained.  There is no height or weight on file to calculate BMI.  Physical Exam Awake alert and oriented, speaking in complete sentences  No results found for this or any previous visit (from the past 72 hour(s)).  PHQ2/9:    03/14/2023   12:38 PM 02/28/2023   12:47 PM 02/23/2023   10:09 AM 12/22/2020    1:11 PM  Depression screen PHQ 2/9  Decreased Interest 0 0 0 0  Down, Depressed, Hopeless 0 0 0 0  PHQ - 2 Score 0 0 0 0  Altered sleeping 0 0    Tired, decreased energy 0 0    Change in appetite 0 0    Feeling bad or failure about yourself  0 0    Trouble concentrating 0 0    Moving slowly or fidgety/restless 0 0    Suicidal thoughts 0 0    PHQ-9 Score 0 0    Difficult doing work/chores Not difficult at all Not difficult at all     PHQ-2/9 Result is negative.    Fall Risk:    03/14/2023   12:38 PM 02/28/2023   12:47 PM 02/23/2023   10:09 AM  Fall Risk   Falls in the past year? 0 0 0  Number falls in past yr: 0 0 0  Injury with Fall? 0 0 0  Risk for fall due to : No Fall Risks No Fall Risks   Follow up Falls prevention discussed;Education provided;Falls evaluation completed Falls prevention discussed;Education provided;Falls evaluation completed      Assessment & Plan  Problem List Items Addressed This Visit       Endocrine   Uncontrolled diabetes mellitus with hyperglycemia, with long-term current use of insulin (HCC) - Primary    Continue taking ozempic weekly, jardiance 10 mg daily and lantus 17 units daily.  She reports that insurance does not cover the ozempic will send in for trulicity.       Relevant Medications   Dulaglutide (TRULICITY) 0.75 MG/0.5ML  SOPN     I discussed the assessment and treatment plan with the patient. The patient was provided an opportunity to ask questions and all were answered. The patient agreed with the plan and demonstrated an understanding of the instructions.  The patient was advised to call back or seek an in-person evaluation if the symptoms worsen or if the condition fails to improve as anticipated.  I provided 20 minutes of non-face-to-face time during this encounter.

## 2023-03-14 NOTE — Progress Notes (Signed)
   Care Guide Note  03/14/2023 Name: Alexa Dunn MRN: [id number] DOB: 1985-11-03  Referred by: Berniece Salines, FNP Reason for referral : Care Coordination (Outreach to schedule with Pharm d New MM DM )   Alexa Dunn is a 37 y.o. year old female who is a primary care patient of Berniece Salines, FNP. Alexa Dunn was referred to the pharmacist for assistance related to DM.    Successful contact was made with the patient to discuss pharmacy services including being ready for the pharmacist to call at least 5 minutes before the scheduled appointment time, to have medication bottles and any blood sugar or blood pressure readings ready for review. The patient agreed to meet with the pharmacist via with the pharmacist via telephone visit on (date/time).  05/09/2023  Alexa Dunn, RMA Care Guide [facility]  [place], [place] [address] Direct Dial: [phone] [email or web address]

## 2023-03-20 ENCOUNTER — Encounter: Payer: Medicaid Other | Admitting: Nurse Practitioner

## 2023-04-01 ENCOUNTER — Encounter: Payer: Self-pay | Admitting: Emergency Medicine

## 2023-04-01 ENCOUNTER — Other Ambulatory Visit: Payer: Self-pay

## 2023-04-01 DIAGNOSIS — Z5321 Procedure and treatment not carried out due to patient leaving prior to being seen by health care provider: Secondary | ICD-10-CM | POA: Insufficient documentation

## 2023-04-01 DIAGNOSIS — R109 Unspecified abdominal pain: Secondary | ICD-10-CM | POA: Insufficient documentation

## 2023-04-01 DIAGNOSIS — R519 Headache, unspecified: Secondary | ICD-10-CM | POA: Insufficient documentation

## 2023-04-01 DIAGNOSIS — R2242 Localized swelling, mass and lump, left lower limb: Secondary | ICD-10-CM | POA: Insufficient documentation

## 2023-04-01 LAB — CBC WITH DIFFERENTIAL/PLATELET
Abs Immature Granulocytes: 0.04 10*3/uL (ref 0.00–0.07)
Basophils Absolute: 0 10*3/uL (ref 0.0–0.1)
Basophils Relative: 0 %
Eosinophils Absolute: 0.1 10*3/uL (ref 0.0–0.5)
Eosinophils Relative: 1 %
HCT: 37.6 % (ref 36.0–46.0)
Hemoglobin: 12.8 g/dL (ref 12.0–15.0)
Immature Granulocytes: 0 %
Lymphocytes Relative: 25 %
Lymphs Abs: 2.7 10*3/uL (ref 0.7–4.0)
MCH: 27.5 pg (ref 26.0–34.0)
MCHC: 34 g/dL (ref 30.0–36.0)
MCV: 80.7 fL (ref 80.0–100.0)
Monocytes Absolute: 0.6 10*3/uL (ref 0.1–1.0)
Monocytes Relative: 6 %
Neutro Abs: 7.5 10*3/uL (ref 1.7–7.7)
Neutrophils Relative %: 68 %
Platelets: 295 10*3/uL (ref 150–400)
RBC: 4.66 MIL/uL (ref 3.87–5.11)
RDW: 12.9 % (ref 11.5–15.5)
WBC: 10.9 10*3/uL — ABNORMAL HIGH (ref 4.0–10.5)
nRBC: 0 % (ref 0.0–0.2)

## 2023-04-01 LAB — COMPREHENSIVE METABOLIC PANEL
ALT: 14 U/L (ref 0–44)
AST: 14 U/L — ABNORMAL LOW (ref 15–41)
Albumin: 3.8 g/dL (ref 3.5–5.0)
Alkaline Phosphatase: 39 U/L (ref 38–126)
Anion gap: 8 (ref 5–15)
BUN: 11 mg/dL (ref 6–20)
CO2: 24 mmol/L (ref 22–32)
Calcium: 8.8 mg/dL — ABNORMAL LOW (ref 8.9–10.3)
Chloride: 104 mmol/L (ref 98–111)
Creatinine, Ser: 0.89 mg/dL (ref 0.44–1.00)
GFR, Estimated: >60 mL/min (ref >60)
Glucose, Bld: 218 mg/dL — ABNORMAL HIGH (ref 70–99)
Potassium: 3.8 mmol/L (ref 3.5–5.1)
Sodium: 136 mmol/L (ref 135–145)
Total Bilirubin: 0.5 mg/dL (ref 0.3–1.2)
Total Protein: 6.9 g/dL (ref 6.5–8.1)

## 2023-04-01 LAB — URINALYSIS, ROUTINE W REFLEX MICROSCOPIC
Bilirubin Urine: NEGATIVE
Glucose, UA: 50 mg/dL — AB
Ketones, ur: NEGATIVE mg/dL
Leukocytes,Ua: NEGATIVE
Nitrite: NEGATIVE
Protein, ur: >=300 mg/dL — AB
Specific Gravity, Urine: 1.018 (ref 1.005–1.030)
pH: 5 (ref 5.0–8.0)

## 2023-04-01 LAB — POC URINE PREG, ED: Preg Test, Ur: NEGATIVE

## 2023-04-01 NOTE — ED Triage Notes (Signed)
Patient c/o headache that started this morning, left ankle swelling x 3 days, and left flank pain radiating into back that started yesterday.

## 2023-04-02 ENCOUNTER — Emergency Department
Admission: EM | Admit: 2023-04-02 | Discharge: 2023-04-02 | Discharge status: Against Medical Advice | Payer: Self-pay | Attending: Emergency Medicine | Admitting: Emergency Medicine

## 2023-04-02 NOTE — ED Notes (Signed)
Pt called from the lobby no answer

## 2023-04-02 NOTE — ED Notes (Addendum)
Pt called from the lobby - no answer. RN called the number listed on the patients chart without successful contact.

## 2023-04-12 ENCOUNTER — Encounter: Payer: Medicaid Other | Admitting: Nurse Practitioner

## 2023-05-08 ENCOUNTER — Telehealth: Payer: Medicaid Other | Admitting: Physician Assistant

## 2023-05-08 ENCOUNTER — Telehealth: Payer: Medicaid Other | Admitting: Family Medicine

## 2023-05-08 DIAGNOSIS — R0981 Nasal congestion: Secondary | ICD-10-CM

## 2023-05-08 DIAGNOSIS — Z20822 Contact with and (suspected) exposure to covid-19: Secondary | ICD-10-CM | POA: Diagnosis not present

## 2023-05-08 MED ORDER — BENZONATATE 100 MG PO CAPS: 100.0000 mg | ORAL_CAPSULE | Freq: Three times a day (TID) | ORAL | 0 refills | Status: DC | PRN

## 2023-05-08 NOTE — Progress Notes (Signed)
Needs to get COVID test and remake VV this afternoon.  Patient acknowledged agreement and understanding of the plan.

## 2023-05-08 NOTE — Progress Notes (Signed)
Virtual Visit Consent   Alexa Dunn, you are scheduled for a virtual visit with a [facility] provider today. Just as with appointments in the office, your consent must be obtained to participate. Your consent will be active for this visit and any virtual visit you may have with one of our providers in the next 365 days. If you have a MyChart account, a copy of this consent can be sent to you electronically.  As this is a virtual visit, video technology does not allow for your provider to perform a traditional examination. This may limit your provider's ability to fully assess your condition. If your provider identifies any concerns that need to be evaluated in person or the need to arrange testing (such as labs, EKG, etc.), we will make arrangements to do so. Although advances in technology are sophisticated, we cannot ensure that it will always work on either your end or our end. If the connection with a video visit is poor, the visit may have to be switched to a telephone visit. With either a video or telephone visit, we are not always able to ensure that we have a secure connection.  By engaging in this virtual visit, you consent to the provision of healthcare and authorize for your insurance to be billed (if applicable) for the services provided during this visit. Depending on your insurance coverage, you may receive a charge related to this service.  I need to obtain your verbal consent now. Are you willing to proceed with your visit today? Alexa Dunn has provided verbal consent on 05/08/2023 for a virtual visit (video or telephone). Piedad Climes, [place]  Date: 05/08/2023 4:21 PM  Virtual Visit via Video Note   I, Piedad Climes, connected with  Alexa Dunn  ([id number], [birth year]) on 05/08/23 at  4:15 PM EDT by a video-enabled telemedicine application and verified that I am speaking with the correct person using two identifiers.  Location: Patient: Virtual Visit Location Patient:  Home Provider: Virtual Visit Location Provider: Home Office   I discussed the limitations of evaluation and management by telemedicine and the availability of in person appointments. The patient expressed understanding and agreed to proceed.    History of Present Illness: Alexa Dunn is a 37 y.o. who identifies as a female who was assigned female at birth, and is being seen today for URI symptoms starting Sunday evening with mild aches, headache. Woke up with a sore throat with odynophagia, dry cough, runny nose and chills. Denies chest pain or SOB. Noting some loose stools last night.  Denies nausea or vomiting. Took home COVID test that was negative.   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   Hypertension 12/26/2012   Class 1 obesity due to excess calories with serious comorbidity and body mass index (BMI) of 34.0 to 34.9 in adult 12/26/2012    Allergies:  Allergies  Allergen Reactions   Penicillins Hives and Rash    Other reaction(s): UNKNOWN   Medications:  Current Outpatient Medications:    benzonatate (TESSALON) 100 MG capsule, Take 1 capsule (100 mg total) by mouth 3 (three) times daily as needed for cough., Disp: 30 capsule, Rfl: 0   albuterol (VENTOLIN HFA) 108 (90  Base) MCG/ACT inhaler, Inhale 2 puffs into the lungs every 6 (six) hours as needed for wheezing or shortness of breath., Disp: 6.7 g, Rfl: 3   Blood Glucose Monitoring Suppl DEVI, 1 each by Does not apply route in the morning, at noon, and at bedtime. May substitute to any manufacturer covered by patient's insurance., Disp: 1 each, Rfl: 0   Continuous Glucose Receiver (FREESTYLE LIBRE 3 READER) DEVI, 1 each by Does not apply route once for 1 dose., Disp: 1 each, Rfl: 0    Continuous Glucose Sensor (FREESTYLE LIBRE 3 SENSOR) MISC, 1 each by Does not apply route every 14 (fourteen) days. Place 1 sensor on the skin every 14 days. Use to check glucose continuously, Disp: 2 each, Rfl: 5   Dulaglutide (TRULICITY) 0.75 MG/0.5ML SOPN, Inject 0.75 mg into the skin once a week., Disp: 2 mL, Rfl: 0   empagliflozin (JARDIANCE) 10 MG TABS tablet, Take 1 tablet (10 mg total) by mouth daily before breakfast., Disp: 90 tablet, Rfl: 1   Glucose Blood (BLOOD GLUCOSE TEST STRIPS) STRP, 1 each by In Vitro route 3 (three) times daily as needed (blood sugar monitoring). May substitute to any manufacturer covered by patient's insurance., Disp: 100 strip, Rfl: 5   insulin glargine (LANTUS) 100 UNIT/ML Solostar Pen, Inject 10-40 Units into the skin daily., Disp: 30 mL, Rfl: 2   Lancets Misc. MISC, 1 each by Does not apply route 3 (three) times daily as needed (blood sugar monitoring). May substitute to any manufacturer covered by patient's insurance., Disp: 100 each, Rfl: 5   lisinopril (ZESTRIL) 10 MG tablet, Take 1 tablet (10 mg total) by mouth daily., Disp: 90 tablet, Rfl: 3  Observations/Objective: Patient is well-developed, well-nourished in no acute distress.  Resting comfortably at home.  Head is normocephalic, atraumatic.  No labored breathing. Speech is clear and coherent with logical content.  Patient is alert and oriented at baseline.   Assessment and Plan: 1. Suspected COVID-19 virus infection - benzonatate (TESSALON) 100 MG capsule; Take 1 capsule (100 mg total) by mouth 3 (three) times daily as needed for cough.  Dispense: 30 capsule; Refill: 0  Initial testing negative but classic symptoms. Will have her retest in 24-36 hours. Supportive measures and OTC medications reviewed. Tessalon per orders. Work note provided.   Follow Up Instructions: I discussed the assessment and treatment plan with the patient. The patient was provided an opportunity to ask questions and all  were answered. The patient agreed with the plan and demonstrated an understanding of the instructions.  A copy of instructions were sent to the patient via MyChart unless otherwise noted below.    The patient was advised to call back or seek an in-person evaluation if the symptoms worsen or if the condition fails to improve as anticipated.  Time:  I spent 10 minutes with the patient via telehealth technology discussing the above problems/concerns.    Piedad Climes, PA-C

## 2023-05-08 NOTE — Patient Instructions (Signed)
Get a home covid test and remake appt this afternoon

## 2023-05-08 NOTE — Patient Instructions (Signed)
[name], thank you for joining Piedad Climes, PA-C for today's virtual visit.  While this provider is not your primary care provider (PCP), if your PCP is located in our provider database this encounter information will be shared with them immediately following your visit.   A [facility] MyChart account gives you access to today's visit and all your visits, tests, and labs performed at [facility] " click here if you don't have a [facility] MyChart account or go to [email or web address]  Consent: (Patient) Alexa Dunn provided verbal consent for this virtual visit at the beginning of the encounter.  Current Medications:  Current Outpatient Medications:    albuterol (VENTOLIN HFA) 108 (90 Base) MCG/ACT inhaler, Inhale 2 puffs into the lungs every 6 (six) hours as needed for wheezing or shortness of breath., Disp: 6.7 g, Rfl: 3   Blood Glucose Monitoring Suppl DEVI, 1 each by Does not apply route in the morning, at noon, and at bedtime. May substitute to any manufacturer covered by patient's insurance., Disp: 1 each, Rfl: 0   Continuous Glucose Receiver (FREESTYLE LIBRE 3 READER) DEVI, 1 each by Does not apply route once for 1 dose., Disp: 1 each, Rfl: 0   Continuous Glucose Sensor (FREESTYLE LIBRE 3 SENSOR) MISC, 1 each by Does not apply route every 14 (fourteen) days. Place 1 sensor on the skin every 14 days. Use to check glucose continuously, Disp: 2 each, Rfl: 5   Dulaglutide (TRULICITY) 0.75 MG/0.5ML SOPN, Inject 0.75 mg into the skin once a week., Disp: 2 mL, Rfl: 0   empagliflozin (JARDIANCE) 10 MG TABS tablet, Take 1 tablet (10 mg total) by mouth daily before breakfast., Disp: 90 tablet, Rfl: 1   Glucose Blood (BLOOD GLUCOSE TEST STRIPS) STRP, 1 each by In Vitro route 3 (three) times daily as needed (blood sugar monitoring). May substitute to any manufacturer covered by patient's insurance., Disp: 100 strip, Rfl: 5   insulin glargine (LANTUS) 100 UNIT/ML  Solostar Pen, Inject 10-40 Units into the skin daily., Disp: 30 mL, Rfl: 2   Lancets Misc. MISC, 1 each by Does not apply route 3 (three) times daily as needed (blood sugar monitoring). May substitute to any manufacturer covered by patient's insurance., Disp: 100 each, Rfl: 5   lisinopril (ZESTRIL) 10 MG tablet, Take 1 tablet (10 mg total) by mouth daily., Disp: 90 tablet, Rfl: 3   Medications ordered in this encounter:  No orders of the defined types were placed in this encounter.    *If you need refills on other medications prior to your next appointment, please contact your pharmacy*  Follow-Up: Call back or seek an in-person evaluation if the symptoms worsen or if the condition fails to improve as anticipated.  [facility] [phone]  Other Instructions Please retest for COVID in 24 hours or so. Let us know if this comes back positive. Please keep hydrated and rest. Ok to use Tylenol or Ibuprofen OTC for headache and sore throat. Mucinex to thin congestion. Tessalon as directed for cough.  You should stay at home until you have retested. If negative, we will want you to remain home until symptoms improving for at least 24 hours and no development of fever -- you should mask until symptoms are resolving.    If you have been instructed to have an in-person evaluation today at a local Urgent Care facility, please use the link below. It will take you to a list of all of our available  [facility], including address, phone number and hours of operation. Please do not delay care.  [facility]  If you or a family member do not have a primary care provider, use the link below to schedule a visit and establish care. When you choose a [facility] primary care physician or advanced practice provider, you gain a long-term partner in health. Find a Primary Care Provider  Learn more about [facility]'s in-office and virtual care options:  -  Get Care Now

## 2023-05-09 ENCOUNTER — Other Ambulatory Visit: Payer: Medicaid Other | Admitting: Pharmacist

## 2023-05-09 ENCOUNTER — Other Ambulatory Visit: Payer: Self-pay | Admitting: Nurse Practitioner

## 2023-05-09 DIAGNOSIS — Z794 Long term (current) use of insulin: Secondary | ICD-10-CM

## 2023-05-09 MED ORDER — OZEMPIC (0.25 OR 0.5 MG/DOSE) 2 MG/3ML ~~LOC~~ SOPN: 0.5000 mg | PEN_INJECTOR | SUBCUTANEOUS | 0 refills | Status: AC

## 2023-05-09 NOTE — Patient Instructions (Signed)
Goals Addressed             This Visit's Progress    Pharmacy Goals       The goal A1c is less than 7%. This is the best way to reduce the risk of the long term complications of diabetes, including heart disease, kidney disease, eye disease, strokes, and nerve damage. An A1c of less than 7% corresponds with fasting sugars less than 130 and 2 hour after meal sugars less than 180. Please use your Freestyle Libre 3 continuous glucose monitor, as well as glucometer when needed, to monitor your blood sugar.  Estelle Grumbles, PharmD, [facility] [phone]

## 2023-05-09 NOTE — Progress Notes (Signed)
05/09/2023 Name: Alexa Dunn MRN: [id number] DOB: 04-Jul-1986  Chief Complaint  Patient presents with   Medication Management    Alexa Dunn is a 36 y.o. year old female who presented for a telephone visit.   They were referred to the pharmacist by their PCP for assistance in managing diabetes and medication access   Subjective:  Care Team: Primary Care Provider: Berniece Salines, FNP ; Next Scheduled Visit: 05/28/2023  Medication Access/Adherence  Current Pharmacy:  [facility] [address]), [place] - [address] [place]) [place] [address] Phone: [phone] Fax: [phone]  [facility] [address] [place] [address] Phone: [phone] Fax: [phone]   Patient reports affordability concerns with their medications: Yes  Patient reports access/transportation concerns to their pharmacy: No  Patient reports adherence concerns with their medications:  No    Reports had difficulty with obtaining Ozempic from her pharmacy as was told Ozempic not covered. Also has been unable to pick up [employer] 3 sensors.   Diabetes:  Current medications:  - Ozempic 0.25 mg weekly (started ~2 months ago) Reports had some stomach upset/diarrhea for first couple of weeks with start (partiuclarly after consuming milk), but now tolerating well - Jardiance 10 mg daily - Lantus 17 units daily.   Medications tried in the past: metformin. glipizide   Current glucose readings: recalls recent morning fasting readings ranging: 100-143  Reports currently using glucometer for blood sugar monitoring  Reports started using Freestyle Libre 3 continuous glucose monitoring sensor from sample from office (used smartphone in place of reader device), but was unable to continue as unable to fill sensor prescription at pharmacy  Patient denies hypoglycemic s/sx including dizziness, shakiness,  sweating.   Reports carries glucose tablets with her in case of hypoglycemia   Objective:  Lab Results  Component Value Date   HGBA1C 12.0 (A) 02/23/2023    Lab Results  Component Value Date   CREATININE 0.89 04/01/2023   BUN 11 04/01/2023   NA 136 04/01/2023   K 3.8 04/01/2023   CL 104 04/01/2023   CO2 24 04/01/2023    Lab Results  Component Value Date   CHOL 185 02/23/2023   HDL 53 02/23/2023   LDLCALC 93 02/23/2023   TRIG 294 (H) 02/23/2023   CHOLHDL 3.5 02/23/2023    Medications Reviewed Today     Reviewed by Manuela Neptune, RPH-CPP (Pharmacist) on 05/09/23 at 1655  Med List Status: <None>   Medication Order Taking? Sig Documenting Provider Last Dose Status Informant  albuterol (VENTOLIN HFA) 108 (90 Base) MCG/ACT inhaler [id number]  Inhale 2 puffs into the lungs every 6 (six) hours as needed for wheezing or shortness of breath. Berniece Salines, FNP  Active   benzonatate (TESSALON) 100 MG capsule [id number]  Take 1 capsule (100 mg total) by mouth 3 (three) times daily as needed for cough. Waldon Merl, PA-C  Active   Blood Glucose Monitoring Suppl DEVI [id number]  1 each by Does not apply route in the morning, at noon, and at bedtime. May substitute to any manufacturer covered by patient's insurance. Danelle Berry, PA-C  Active   Continuous Glucose Sensor (FREESTYLE LIBRE 3 SENSOR) [place] [id number]  1 each by Does not apply route every 14 (fourteen) days. Place 1 sensor on the skin every 14 days. Use to check glucose continuously Berniece Salines, FNP  Active   empagliflozin (JARDIANCE) 10 MG TABS  tablet [id number] Yes Take 1 tablet (10 mg total) by mouth daily before breakfast. Berniece Salines, FNP Taking Active   Glucose Blood (BLOOD GLUCOSE TEST STRIPS) STRP [id number]  1 each by In Vitro route 3 (three) times daily as needed (blood sugar monitoring). May substitute to any manufacturer covered by patient's insurance. Danelle Berry, PA-C  Active   insulin glargine  (LANTUS) 100 UNIT/ML Solostar Pen [id number]  Inject 10-40 Units into the skin daily. Danelle Berry, PA-C  Active   Lancets Misc. MISC [id number]  1 each by Does not apply route 3 (three) times daily as needed (blood sugar monitoring). May substitute to any manufacturer covered by patient's insurance. Danelle Berry, PA-C  Active   lisinopril (ZESTRIL) 10 MG tablet [id number]  Take 1 tablet (10 mg total) by mouth daily. Berniece Salines, FNP  Active   Semaglutide,0.25 or 0.5MG /DOS, (OZEMPIC, 0.25 OR 0.5 MG/DOSE,) 2 MG/3ML SOPN [id number]  Inject 0.5 mg into the skin once a week. Berniece Salines, FNP  Active               Assessment/Plan:   Unable to complete comprehensive medication review today as patient reports that she is not currently at home/traveling now. Agree to completed during our next telephone call.  Diabetes: - Currently uncontrolled based on latest A1C lab result - Reviewed long term cardiovascular and renal outcomes of uncontrolled blood sugar - Reviewed goal A1c, goal fasting, and goal 2 hour post prandial glucose - Reviewed dietary modifications including importance of having regular well-balanced meals throughout the day, while controlling carbohydrate portion sizes - Collaborate with PCP regarding patient's current medication regimen and reports morning fasting blood sugar readings. Recommend increasing Ozempic dose to 0.5 mg weekly  PCP sends new Rx for Ozempic 0.5 mg weekly to pharmacy for patient Follow up with [facility] and confirm that both patient's Ozempic and Freestyle Libre 3 sensors now going through her insurance Provide update to patient - Counsel patient on s/s of low blood sugar and how to treat lows Review rules of 15s - importance of using 15 grams of sugar to treat low, recheck blood sugar in 15 minutes, treat again if remains low or, if back to normal, having meal if mealtime or snack  Review examples of sources of 15 grams of sugar - Recommend to restart  using Freestyle Libre 3 CGM to monitor blood sugar/as feedback on dietary choices - Recommend to check glucose with fingerstick check when needed for symptoms and as back up to CGM. Patient to contact office if needed for readings outside of established parameters or symptoms   Follow Up Plan: Clinical Pharmacist will out to patient by telephone again on 06/20/2023 at 8:30 AM   Estelle Grumbles, PharmD, [facility] [phone]

## 2023-05-15 DIAGNOSIS — Z20822 Contact with and (suspected) exposure to covid-19: Secondary | ICD-10-CM | POA: Diagnosis not present

## 2023-05-15 DIAGNOSIS — J069 Acute upper respiratory infection, unspecified: Secondary | ICD-10-CM | POA: Diagnosis not present

## 2023-05-26 ENCOUNTER — Emergency Department: Payer: Medicaid Other

## 2023-05-26 ENCOUNTER — Other Ambulatory Visit: Payer: Self-pay

## 2023-05-26 ENCOUNTER — Emergency Department
Admission: EM | Admit: 2023-05-26 | Discharge: 2023-05-26 | Discharge status: Against Medical Advice | Payer: Medicaid Other | Attending: Emergency Medicine | Admitting: Emergency Medicine

## 2023-05-26 ENCOUNTER — Telehealth: Payer: Self-pay | Admitting: Emergency Medicine

## 2023-05-26 DIAGNOSIS — Z7982 Long term (current) use of aspirin: Secondary | ICD-10-CM | POA: Diagnosis not present

## 2023-05-26 DIAGNOSIS — I10 Essential (primary) hypertension: Secondary | ICD-10-CM | POA: Diagnosis not present

## 2023-05-26 DIAGNOSIS — Z880 Allergy status to penicillin: Secondary | ICD-10-CM | POA: Diagnosis not present

## 2023-05-26 DIAGNOSIS — Z794 Long term (current) use of insulin: Secondary | ICD-10-CM | POA: Diagnosis not present

## 2023-05-26 DIAGNOSIS — Z5321 Procedure and treatment not carried out due to patient leaving prior to being seen by health care provider: Secondary | ICD-10-CM | POA: Insufficient documentation

## 2023-05-26 DIAGNOSIS — R6884 Jaw pain: Secondary | ICD-10-CM | POA: Diagnosis not present

## 2023-05-26 DIAGNOSIS — Z955 Presence of coronary angioplasty implant and graft: Secondary | ICD-10-CM | POA: Diagnosis not present

## 2023-05-26 DIAGNOSIS — F17210 Nicotine dependence, cigarettes, uncomplicated: Secondary | ICD-10-CM | POA: Diagnosis not present

## 2023-05-26 DIAGNOSIS — I2510 Atherosclerotic heart disease of native coronary artery without angina pectoris: Secondary | ICD-10-CM | POA: Diagnosis not present

## 2023-05-26 DIAGNOSIS — Z79899 Other long term (current) drug therapy: Secondary | ICD-10-CM | POA: Diagnosis not present

## 2023-05-26 DIAGNOSIS — N179 Acute kidney failure, unspecified: Secondary | ICD-10-CM | POA: Diagnosis not present

## 2023-05-26 DIAGNOSIS — Z7902 Long term (current) use of antithrombotics/antiplatelets: Secondary | ICD-10-CM | POA: Diagnosis not present

## 2023-05-26 DIAGNOSIS — R197 Diarrhea, unspecified: Secondary | ICD-10-CM | POA: Diagnosis not present

## 2023-05-26 DIAGNOSIS — E876 Hypokalemia: Secondary | ICD-10-CM | POA: Diagnosis not present

## 2023-05-26 DIAGNOSIS — R110 Nausea: Secondary | ICD-10-CM | POA: Insufficient documentation

## 2023-05-26 DIAGNOSIS — J45909 Unspecified asthma, uncomplicated: Secondary | ICD-10-CM | POA: Diagnosis not present

## 2023-05-26 DIAGNOSIS — D72829 Elevated white blood cell count, unspecified: Secondary | ICD-10-CM | POA: Diagnosis not present

## 2023-05-26 DIAGNOSIS — E669 Obesity, unspecified: Secondary | ICD-10-CM | POA: Diagnosis not present

## 2023-05-26 DIAGNOSIS — F32A Depression, unspecified: Secondary | ICD-10-CM | POA: Diagnosis not present

## 2023-05-26 DIAGNOSIS — I2102 ST elevation (STEMI) myocardial infarction involving left anterior descending coronary artery: Secondary | ICD-10-CM | POA: Diagnosis not present

## 2023-05-26 DIAGNOSIS — Z6833 Body mass index (BMI) 33.0-33.9, adult: Secondary | ICD-10-CM | POA: Diagnosis not present

## 2023-05-26 DIAGNOSIS — E785 Hyperlipidemia, unspecified: Secondary | ICD-10-CM | POA: Diagnosis not present

## 2023-05-26 DIAGNOSIS — F419 Anxiety disorder, unspecified: Secondary | ICD-10-CM | POA: Diagnosis not present

## 2023-05-26 DIAGNOSIS — E1165 Type 2 diabetes mellitus with hyperglycemia: Secondary | ICD-10-CM | POA: Diagnosis not present

## 2023-05-26 DIAGNOSIS — R0602 Shortness of breath: Secondary | ICD-10-CM | POA: Insufficient documentation

## 2023-05-26 DIAGNOSIS — R0789 Other chest pain: Secondary | ICD-10-CM | POA: Insufficient documentation

## 2023-05-26 DIAGNOSIS — Z7984 Long term (current) use of oral hypoglycemic drugs: Secondary | ICD-10-CM | POA: Diagnosis not present

## 2023-05-26 DIAGNOSIS — R079 Chest pain, unspecified: Secondary | ICD-10-CM | POA: Diagnosis not present

## 2023-05-26 DIAGNOSIS — R072 Precordial pain: Secondary | ICD-10-CM | POA: Diagnosis not present

## 2023-05-26 LAB — CBC
HCT: 42.4 % (ref 36.0–46.0)
Hemoglobin: 14.4 g/dL (ref 12.0–15.0)
MCH: 27.1 pg (ref 26.0–34.0)
MCHC: 34 g/dL (ref 30.0–36.0)
MCV: 79.8 fL — ABNORMAL LOW (ref 80.0–100.0)
Platelets: 331 10*3/uL (ref 150–400)
RBC: 5.31 MIL/uL — ABNORMAL HIGH (ref 3.87–5.11)
RDW: 12.2 % (ref 11.5–15.5)
WBC: 11.8 10*3/uL — ABNORMAL HIGH (ref 4.0–10.5)
nRBC: 0 % (ref 0.0–0.2)

## 2023-05-26 LAB — BASIC METABOLIC PANEL
Anion gap: 14 (ref 5–15)
BUN: 11 mg/dL (ref 6–20)
CO2: 19 mmol/L — ABNORMAL LOW (ref 22–32)
Calcium: 9.4 mg/dL (ref 8.9–10.3)
Chloride: 100 mmol/L (ref 98–111)
Creatinine, Ser: 0.92 mg/dL (ref 0.44–1.00)
GFR, Estimated: >60 mL/min (ref >60)
Glucose, Bld: 343 mg/dL — ABNORMAL HIGH (ref 70–99)
Potassium: 3.5 mmol/L (ref 3.5–5.1)
Sodium: 133 mmol/L — ABNORMAL LOW (ref 135–145)

## 2023-05-26 LAB — TROPONIN I (HIGH SENSITIVITY): Troponin I (High Sensitivity): 177 ng/L (ref <18)

## 2023-05-26 NOTE — Telephone Encounter (Signed)
This RN called and spoke with patient regarding labs per Dr. Derrill Kay. Encouraged patient to return for further testing/evaluation. Pt states understanding and states she will return to ED.

## 2023-05-26 NOTE — ED Triage Notes (Addendum)
Pt to ED via POV from home. Pt reports SOB and mid/left sided CP. Pt denies cardiac hx. Pt reports has not been able to catch her breath since 2pm. Pt also reports left jaw pain. Pt also reports was sick 2wks ago and endorses nausea.

## 2023-05-27 DIAGNOSIS — I2102 ST elevation (STEMI) myocardial infarction involving left anterior descending coronary artery: Secondary | ICD-10-CM | POA: Diagnosis not present

## 2023-05-27 DIAGNOSIS — R079 Chest pain, unspecified: Secondary | ICD-10-CM | POA: Diagnosis not present

## 2023-05-27 DIAGNOSIS — I10 Essential (primary) hypertension: Secondary | ICD-10-CM | POA: Diagnosis not present

## 2023-05-27 DIAGNOSIS — I252 Old myocardial infarction: Secondary | ICD-10-CM | POA: Diagnosis not present

## 2023-05-27 DIAGNOSIS — E785 Hyperlipidemia, unspecified: Secondary | ICD-10-CM | POA: Diagnosis not present

## 2023-05-28 ENCOUNTER — Ambulatory Visit: Payer: Medicaid Other | Admitting: Nurse Practitioner

## 2023-05-28 DIAGNOSIS — Z955 Presence of coronary angioplasty implant and graft: Secondary | ICD-10-CM | POA: Diagnosis not present

## 2023-05-28 DIAGNOSIS — I2102 ST elevation (STEMI) myocardial infarction involving left anterior descending coronary artery: Secondary | ICD-10-CM | POA: Diagnosis not present

## 2023-05-28 DIAGNOSIS — I2510 Atherosclerotic heart disease of native coronary artery without angina pectoris: Secondary | ICD-10-CM | POA: Diagnosis not present

## 2023-05-28 DIAGNOSIS — I10 Essential (primary) hypertension: Secondary | ICD-10-CM | POA: Diagnosis not present

## 2023-05-28 DIAGNOSIS — E785 Hyperlipidemia, unspecified: Secondary | ICD-10-CM | POA: Diagnosis not present

## 2023-05-28 NOTE — Progress Notes (Deleted)
There were no vitals taken for this visit.   Subjective:    Patient ID: Alexa Dunn, female    DOB: 23-Mar-1986, 37 y.o.   MRN: [id number]  HPI: Alexa Dunn is a 37 y.o. female  No chief complaint on file.   Diabetes, Type 2:  -Last A1c 12.0, today *** -Medications: jardiance 10 mg daily, ozempic weekly, lantus 17 units daily -Patient is compliant with the above medications and reports no side effects. *** -Checking BG at home: *** -Fasting home BG: *** -Eye exam: utd -Foot exam: utd -Microalbumin: utd -Statin: no -PNA vaccine: no -Denies symptoms of hypoglycemia, polyuria, polydipsia, numbness extremities, foot ulcers/trauma. ***   Hypertension:  -Medications: lisinopril 10 mg daily -Patient is compliant with above medications and reports no side effects. -Checking BP at home (average): *** -Highest BP at home: *** -Lowest BP at home: *** -Denies any SOB, CP, vision changes, LE edema or symptoms of hypotension -Diet: recommend DASH diet  -Exercise: recommend 150 min of physical activity weekly        05/26/2023    6:28 PM 04/01/2023   10:33 PM 04/01/2023   10:32 PM  Vitals with BMI  Height  5\' 5"    Weight  205 lbs   BMI  34.11   Systolic 179  167  Diastolic 90  88  Pulse 107  100    Asthma:    -Asthma status: controlled -Current Treatments: albuterol as needed -Satisfied with current treatment?: yes -Albuterol/rescue inhaler frequency: occasionally -Dyspnea frequency: not often -Wheezing frequency:not often -Cough frequency: not often -Nocturnal symptom frequency: not often -Limitation of activity: no -Current upper respiratory symptoms: no -Triggers: heat -Failed/intolerant to following asthma meds:  -Aerochamber/spacer use: no -Visits to ER or Urgent Care in past year: no -Pneumovax: Not up to Date -Influenza: Not up to Date  Asthma:    -Asthma status: controlled -Current Treatments: asthma as needed -Satisfied with current treatment?:  yes -Albuterol/rescue inhaler frequency: has not had to use at all this year -Limitation of activity: no -Current upper respiratory symptoms: no -Triggers:  heat -Aerochamber/spacer use: no -Visits to ER or Urgent Care in past year: no -Pneumovax: Not up to Date -Influenza: Not up to Date   Obesity:  Current weight : *** BMI: *** previous weight:207 lbs Treatment Tried: lifestyle modification, currently on ozempic Comorbidities: DM, HTN, Asthma      03/14/2023   12:38 PM 02/28/2023   12:47 PM 02/23/2023   10:09 AM 12/22/2020    1:11 PM  Depression screen PHQ 2/9  Decreased Interest 0 0 0 0  Down, Depressed, Hopeless 0 0 0 0  PHQ - 2 Score 0 0 0 0  Altered sleeping 0 0    Tired, decreased energy 0 0    Change in appetite 0 0    Feeling bad or failure about yourself  0 0    Trouble concentrating 0 0    Moving slowly or fidgety/restless 0 0    Suicidal thoughts 0 0    PHQ-9 Score 0 0    Difficult doing work/chores Not difficult at all Not difficult at all      Relevant past medical, surgical, family and social history reviewed and updated as indicated. Interim medical history since our last visit reviewed. Allergies and medications reviewed and updated.  Review of Systems  Constitutional: Negative for fever or weight change.  Respiratory: Negative for cough and shortness of breath.   Cardiovascular: Negative for chest pain or palpitations.  Gastrointestinal: Negative for abdominal pain, no bowel changes.  Musculoskeletal: Negative for gait problem or joint swelling.  Skin: Negative for rash.  Neurological: Negative for dizziness or headache.  No other specific complaints in a complete review of systems (except as listed in HPI above).      Objective:    There were no vitals taken for this visit.  Wt Readings from Last 3 Encounters:  04/01/23 205 lb (93 kg)  02/28/23 202 lb 9.6 oz (91.9 kg)  02/23/23 207 lb (93.9 kg)    Physical Exam  Constitutional: Patient  appears well-developed and well-nourished. Obese  No distress.  HEENT: head atraumatic, normocephalic, pupils equal and reactive to light, neck supple, throat within normal limits Cardiovascular: Normal rate, regular rhythm and normal heart sounds.  No murmur heard. No BLE edema. Pulmonary/Chest: Effort normal and breath sounds normal. No respiratory distress. Abdominal: Soft.  There is no tenderness. Psychiatric: Patient has a normal mood and affect. behavior is normal. Judgment and thought content normal.   Diabetic Foot Exam - Simple   No data filed     Results for orders placed or performed during the hospital encounter of 05/26/23  Basic metabolic panel  Result Value Ref Range   Sodium 133 (L) 135 - 145 mmol/L   Potassium 3.5 3.5 - 5.1 mmol/L   Chloride 100 98 - 111 mmol/L   CO2 19 (L) 22 - 32 mmol/L   Glucose, Bld 343 (H) 70 - 99 mg/dL   BUN 11 6 - 20 mg/dL   Creatinine, Ser 2.13 0.44 - 1.00 mg/dL   Calcium 9.4 8.9 - 08.6 mg/dL   GFR, Estimated >57 >84 mL/min   Anion gap 14 5 - 15  CBC  Result Value Ref Range   WBC 11.8 (H) 4.0 - 10.5 K/uL   RBC 5.31 (H) 3.87 - 5.11 MIL/uL   Hemoglobin 14.4 12.0 - 15.0 g/dL   HCT 69.6 29.5 - 28.4 %   MCV 79.8 (L) 80.0 - 100.0 fL   MCH 27.1 26.0 - 34.0 pg   MCHC 34.0 30.0 - 36.0 g/dL   RDW 13.2 44.0 - 10.2 %   Platelets 331 150 - 400 K/uL   nRBC 0.0 0.0 - 0.2 %  Troponin I (High Sensitivity)  Result Value Ref Range   Troponin I (High Sensitivity) 177 (HH) <18 ng/L      Assessment & Plan:   Problem List Items Addressed This Visit   None     Follow up plan: No follow-ups on file.

## 2023-05-29 DIAGNOSIS — I2102 ST elevation (STEMI) myocardial infarction involving left anterior descending coronary artery: Secondary | ICD-10-CM | POA: Diagnosis not present

## 2023-05-29 DIAGNOSIS — I10 Essential (primary) hypertension: Secondary | ICD-10-CM | POA: Diagnosis not present

## 2023-05-29 DIAGNOSIS — E785 Hyperlipidemia, unspecified: Secondary | ICD-10-CM | POA: Diagnosis not present

## 2023-05-30 DIAGNOSIS — I2102 ST elevation (STEMI) myocardial infarction involving left anterior descending coronary artery: Secondary | ICD-10-CM | POA: Diagnosis not present

## 2023-05-31 DIAGNOSIS — I2102 ST elevation (STEMI) myocardial infarction involving left anterior descending coronary artery: Secondary | ICD-10-CM | POA: Diagnosis not present

## 2023-06-11 ENCOUNTER — Ambulatory Visit: Payer: Self-pay

## 2023-06-11 NOTE — Telephone Encounter (Signed)
Chief Complaint: Dizziness Symptoms: lightheaded, feeling faint Frequency: comes and goes  Pertinent Negatives: Patient denies currently feeling faint Disposition: [] ED /[] Urgent Care (no appt availability in office) / [x] Appointment(In office/virtual)/ []  [place] [facility]/ [] Home Care/ [] Refused Recommended Disposition /[] [place] [facility]/ []  Follow-up with PCP Additional Notes: Patient stated she had a heart attack on 05/26/23 and when she was released form [facility] hospital she was started on new medication. Patient believes the medication is making her feel dizzy and faint like her blood pressure is dropping, but she does not have a blood pressure monitor at home to check. Patient given care advice and was offered an appointment tomorrow but patient stated she could not make it in. Patient has been scheduled for first available with PCP per patient's request 06/18/23. Patient advised to go back to ED if symptoms get worse. Patient verbalized understanding. Reason for Disposition  [1] MODERATE dizziness (e.g., interferes with normal activities) AND [2] has NOT been evaluated by doctor (or NP/PA) for this  (Exception: Dizziness caused by heat exposure, sudden standing, or poor fluid intake.)  Answer Assessment - Initial Assessment Questions 1. DESCRIPTION: "Describe your dizziness."     I feel like I am floating 2. LIGHTHEADED: "Do you feel lightheaded?" (e.g., somewhat faint, woozy, weak upon standing)     Yes  3. VERTIGO: "Do you feel like either you or the room is spinning or tilting?" (i.e. vertigo)     Like room is spinning 4. SEVERITY: "How bad is it?"  "Do you feel like you are going to faint?" "Can you stand and walk?"   - MILD: Feels slightly dizzy, but walking normally.   - MODERATE: Feels unsteady when walking, but not falling; interferes with normal activities (e.g., school, work).   - SEVERE: Unable to walk without falling, or requires assistance to walk without falling;  feels like passing out now.      Severe,sometimes I feel like I'm going to faint 5. ONSET:  "When did the dizziness begin?"     Friday of last week  6. AGGRAVATING FACTORS: "Does anything make it worse?" (e.g., standing, change in head position)     Standing up makes it worse        8. CAUSE: "What do you think is causing the dizziness?"     Changes in my medication 9. RECURRENT SYMPTOM: "Have you had dizziness before?" If Yes, ask: "When was the last time?" "What happened that time?"     No not like this  10. OTHER SYMPTOMS: "Do you have any other symptoms?" (e.g., fever, chest pain, vomiting, diarrhea, bleeding)       Nauseous, waves of tingling on the right side of body  Protocols used: Dizziness - Lightheadedness-A-AH

## 2023-06-11 NOTE — Telephone Encounter (Signed)
FYI

## 2023-06-18 ENCOUNTER — Ambulatory Visit: Payer: Medicaid Other | Admitting: Nurse Practitioner

## 2023-06-20 ENCOUNTER — Other Ambulatory Visit: Payer: Medicaid Other | Admitting: Pharmacist

## 2023-06-20 ENCOUNTER — Telehealth: Payer: Self-pay | Admitting: Pharmacist

## 2023-06-20 NOTE — Patient Instructions (Signed)
Follow up with your Cardiologist at [phone] regarding your dizziness.  Please contact your PCP office at [phone] to schedule follow up  Please follow up with your pharmacy to pick up your prescriptions.  Thank you!  Estelle Grumbles, PharmD, [facility] [phone]

## 2023-06-20 NOTE — Progress Notes (Signed)
06/20/2023 Name: Alexa Dunn MRN: [id number] DOB: 08-Jun-1986  Chief Complaint  Patient presents with   Medication Management   Medication Adherence    Alexa Dunn is a 37 y.o. year old female who presented for a telephone visit.   They were referred to the pharmacist by their PCP for assistance in managing diabetes and medication access   Subjective:   Care Team: Primary Care Provider: Berniece Salines, FNP  Medication Access/Adherence  Current Pharmacy:  [facility] [address]), [place] - [address] [place]) [place] [address] Phone: [phone] Fax: [phone]  [facility] - [facility] [address] [place] [address] Phone: [phone] Fax: [phone]   Patient reports affordability concerns with their medications: No Patient reports access/transportation concerns to their pharmacy: No  Patient reports adherence concerns with their medications:  Yes     From review of chart, note patient admitted to [facility] 05/26/2023 to 05/30/2023 related to STEMI. At discharge, provider advised patient to: - Stop:  Citalopram 10 mg  Levemir  Meloxicam 7.5 mg  Nexplanon  - Start:   Aspirin 81 mg daily  Atorvastatin 80 mg daily  Jardiance 25 mg daily  Metformin 1000 mg twice daily  Metoprolol ER 50 mg daily Nitrostat 0.4 SL tablet - Place 1 tablet under the tongue every five minutes as needed for chest pain. Maximum of 3 doses in 15 minutes.  Study Medication Brilinta 90 mg twice daily  - Change how taking:  Lantus insulin 10 units under the skin nightly  Ozempic 1 mg every 7 days  Unable to complete comprehensive medication review or review blood sugar or blood pressure readings today as reports that she is not home; traveling for work  Patient requests assistance as states that she has not been able to pick up her metformin prescription (prescribed at hospital discharge)  and is almost out of her Ozempic as discharging prescriber advised her to increase the dose. Reports checked hospital discharge document when she was still home and picked up the rest of her new medications from [facility], but that the metformin and Ozempic were not there   Also reports has had ongoing dizziness which she attributes to starting metoprolol ER since hospital discharge. Denies having follow up appointment with Cardiologist  Patient does not have home blood pressure monitoring results to share  States that she plans to follow up with PCP regarding referral to gynecology about removal of Nexplanon  Diabetes:   Current medications:  - Ozempic 1 mg (using 2 x 0.5 mg injections) weekly   Reports increased to this dose since hospital discharge - Jardiance 25 mg daily - Lantus 10 units daily.  - Not yet started metformin 1000 mg twice daily as was prescribed at recent hospital discharge    Reports currently monitoring home blood sugar, but does not have blood sugar readings today   Patient denies hypoglycemic s/sx including dizziness, shakiness, sweating.    Note patient carries glucose tablets with her in case of hypoglycemia     Objective:  Lab Results  Component Value Date   HGBA1C 12.0 (A) 02/23/2023  From review of shared record with [facility], note A1C was 9.9% on 05/26/2023  Lab Results  Component Value Date   CREATININE 0.92 05/26/2023   BUN 11 05/26/2023   NA 133 (L) 05/26/2023   K 3.5 05/26/2023   CL 100 05/26/2023   CO2 19 (L) 05/26/2023  Lab Results  Component Value Date   CHOL 185 02/23/2023   HDL 53 02/23/2023   LDLCALC 93 02/23/2023   TRIG 294 (H) 02/23/2023   CHOLHDL 3.5 02/23/2023     Medications Reviewed Today     Reviewed by Manuela Neptune, RPH-CPP (Pharmacist) on 06/20/23 at 1709  Med List Status: <None>   Medication Order Taking? Sig Documenting Provider Last Dose Status Informant  albuterol (VENTOLIN HFA) 108 (90  Base) MCG/ACT inhaler [id number]  Inhale 2 puffs into the lungs every 6 (six) hours as needed for wheezing or shortness of breath. Alexa Salines, FNP  Active   aspirin 81 MG chewable tablet [id number] Yes Chew 81 mg by mouth daily. [provider]  Active   atorvastatin (LIPITOR) 80 MG tablet [id number] Yes Take 1 tablet by mouth daily. [provider]  Active   benzonatate (TESSALON) 100 MG capsule [id number]  Take 1 capsule (100 mg total) by mouth 3 (three) times daily as needed for cough. Waldon Merl, PA-C  Active   Blood Glucose Monitoring Suppl DEVI [id number]  1 each by Does not apply route in the morning, at noon, and at bedtime. May substitute to any manufacturer covered by patient's insurance. Danelle Berry, PA-C  Active   Continuous Glucose Sensor (FREESTYLE LIBRE 3 SENSOR) [place] [id number]  1 each by Does not apply route every 14 (fourteen) days. Place 1 sensor on the skin every 14 days. Use to check glucose continuously Alexa Salines, FNP  Active   empagliflozin (JARDIANCE) 25 MG TABS tablet [id number] Yes Take 1 tablet by mouth daily. [provider]  Active   Glucose Blood (BLOOD GLUCOSE TEST STRIPS) STRP [id number]  1 each by In Vitro route 3 (three) times daily as needed (blood sugar monitoring). May substitute to any manufacturer covered by patient's insurance. Danelle Berry, PA-C  Active   insulin glargine (LANTUS) 100 UNIT/ML Solostar Pen [id number]  Inject 10-40 Units into the skin daily.  Patient taking differently: Inject 10 Units into the skin daily.   Danelle Berry, PA-C  Active   Lancets Misc. MISC [id number]  1 each by Does not apply route 3 (three) times daily as needed (blood sugar monitoring). May substitute to any manufacturer covered by patient's insurance. Danelle Berry, PA-C  Active   lisinopril (ZESTRIL) 10 MG tablet [id number]  Take 1 tablet (10 mg total) by mouth daily. Alexa Salines, FNP  Active   metFORMIN (GLUCOPHAGE) 1000 MG tablet [id number]  Yes Take 1 tablet (1,000 mg total) by mouth in the morning and 1 tablet (1,000 mg total) in the evening. Take with meals. [provider]  Active   metoprolol succinate (TOPROL-XL) 50 MG 24 hr tablet [id number] Yes Take 1 tablet by mouth daily. [provider]  Active   nitroGLYCERIN (NITROSTAT) 0.4 MG SL tablet [id number] Yes Place 1 tablet (0.4 mg total) under the tongue every five (5) minutes as needed for chest pain. Maximum of 3 doses in 15 minutes. [provider]  Active   Semaglutide,0.25 or 0.5MG /DOS, (OZEMPIC, 0.25 OR 0.5 MG/DOSE,) 2 MG/3ML SOPN [id number]  Inject 0.5 mg into the skin once a week.  Patient taking differently: Inject 1 mg into the skin once a week.   Alexa Salines, FNP  Active   ticagrelor [facility]) 90 MG TABS tablet [id number] Yes Take 1 tablet (90 mg total) by mouth two (2) times a day. [provider]  Active  Assessment/Plan:   Unable to complete comprehensive medication review or review blood sugar or blood pressure readings today as reports that she is not home; traveling for work  Advise patient to contact Cardiology office now to report concern about ongoing dizziness and needing follow up appointment with Cardiology - States that she calls and leaves a message for provider requesting a call back - Encourage patient to use caution and take positional changes slowly  Also outreach to [facility] [place] today on behalf of patient. Speak with Cardiology office regarding patient's report of ongoing dizziness. Office advises that they will contact patient today to discuss concern and schedule follow up with Cardiologist.  Follow up with [facility] today on behalf of patient. Speak with [employer] [facility] who advises metformin 1000 mg and Ozempic 1 mg prescriptions were also sent to pharmacy on 9/18 from [facility], but were not picked up. Confirms will fill both prescriptions for patient today and her copayment will  be $4 for each. - Follow up with patient to provide this update. Patient states that she will plan to pick both up on Friday - As patient reports previous difficulty with tolerating metformin, provide patient with instructions for gradually tapering up metformin dose to target prescribed dose to aid with tolerability. Also counsel on importance of taking doses with meals.  Advise patient to contact PCP office to schedule a follow up appointment with provider  Advise patient to contact provider offices for any further medical questions or for any new or worsening symptoms   Follow Up Plan: Clinical Pharmacist will follow up with patient by telephone on 07/04/2023 at 11:00 AM   Estelle Grumbles, PharmD, [facility] [phone]

## 2023-06-20 NOTE — Progress Notes (Signed)
   Outreach Note  06/20/2023 Name: Alexa Dunn MRN: [id number] DOB: 29-Feb-1986  Referred by: Berniece Salines, FNP  Was unable to reach patient via telephone today and have left HIPAA compliant voicemail asking patient to return my call.    Follow Up Plan: Will collaborate with Care Guide to outreach to schedule follow up with me  Estelle Grumbles, PharmD, [facility] [phone]

## 2023-07-04 ENCOUNTER — Encounter: Payer: Self-pay | Admitting: Pharmacist

## 2023-07-04 ENCOUNTER — Other Ambulatory Visit: Payer: Self-pay | Admitting: Pharmacist

## 2023-07-04 NOTE — Patient Instructions (Signed)
Goals Addressed             This Visit's Progress    Pharmacy Goals       The goal A1c is less than 7%. This is the best way to reduce the risk of the long term complications of diabetes, including heart disease, kidney disease, eye disease, strokes, and nerve damage. An A1c of less than 7% corresponds with fasting sugars less than 130 and 2 hour after meal sugars less than 180. Please use your Freestyle Libre 3 continuous glucose monitor, as well as glucometer when needed, to monitor your blood sugar.  Check your blood pressure twice weekly, and any time you have concerning symptoms like headache, chest pain, dizziness, shortness of breath, or vision changes.   Our goal is less than 130/80.  To appropriately check your blood pressure, make sure you do the following:  1) Avoid caffeine, exercise, or tobacco products for 30 minutes before checking. Empty your bladder. 2) Sit with your back supported in a flat-backed chair. Rest your arm on something flat (arm of the chair, table, etc). 3) Sit still with your feet flat on the floor, resting, for at least 5 minutes.  4) Check your blood pressure. Take 1-2 readings.  5) Write down these readings and bring with you to any provider appointments.  Bring your home blood pressure machine with you to a provider's office for accuracy comparison at least once a year.   Make sure you take your blood pressure medications before you come to any office visit, even if you were asked to fast for labs.   Estelle Grumbles, PharmD, [facility] [phone]

## 2023-07-04 NOTE — Progress Notes (Signed)
07/04/2023 Name: Alexa Dunn MRN: [id number] DOB: 1986-04-02  Chief Complaint  Patient presents with   Medication Management    Alexa Dunn is a 37 y.o. year old female who presented for a telephone visit.   They were referred to the pharmacist by their PCP for assistance in managing diabetes and medication access   Subjective:   Care Team: Primary Care Provider: Berniece Salines, FNP Cardiologist: Jabier Mutton, MD; Next Scheduled Office Visit: 07/10/2023  Medication Access/Adherence  Current Pharmacy:  [facility] [address]), [place] - [address] [place]) [place] [address] Phone: [phone] Fax: [phone]  [facility] REGIONAL - [facility] [address] [place] [address] Phone: [phone] Fax: [phone]   Patient reports affordability concerns with their medications: No Patient reports access/transportation concerns to their pharmacy: No  Patient reports adherence concerns with their medications:  No     Diabetes:   Current medications:  - Ozempic 1 mg weekly   Reports tolerating well and helping with appetite control - Jardiance 25 mg daily - Lantus 10 units daily.  - metformin 1000 mg twice daily  Reports tolerating well  Reports started using Freestyle Libre 3 continuous glucose monitoring, but unable to access readings today as having an error with the App  This morning fasting blood sugar: 145    Patient denies hypoglycemic s/sx including dizziness, shakiness, sweating.    Note patient carries glucose tablets with her in case of hypoglycemia  Current physical activity: stays active throughout the day walking dogs, walking with her kids, etc daily   S/P STEMI/ASCVD Risk Reduction/HTN:  Current Medications: - Lipid lowering medications: atorvastatin 80 mg daily - Antiplatelet regimen: Brilinta 90 mg twice daily + aspirin 81 mg daily - metoprolol ER 50  mg daily  Reports received a call back on 06/21/2023 from Nurse with [facility] regarding her dizziness. Reports Nurse scheduled her for appointment with Cardiologist for 07/10/2023 and advised her okay to hold her lisinopril until this appointment (due to dizziness)  Clinical ASCVD: STEMI (05/2023)  Denies currently monitoring home blood pressure as needs new battery for monitor  Current physical activity: stays active throughout the day walking dogs, walking with her kids, etc daily   Tobacco Abuse:  Reports she quit smoking cigarettes a year ago, but was vaping. However, reports has stopped use of all nicotine products, including vaping since 05/26/2023   Objective:  Lab Results  Component Value Date   HGBA1C 12.0 (A) 02/23/2023  From review of shared record with [facility], note A1C was 9.9% on 05/26/2023    Lab Results  Component Value Date   CREATININE 0.92 05/26/2023   BUN 11 05/26/2023   NA 133 (L) 05/26/2023   K 3.5 05/26/2023   CL 100 05/26/2023   CO2 19 (L) 05/26/2023    Lab Results  Component Value Date   CHOL 185 02/23/2023   HDL 53 02/23/2023   LDLCALC 93 02/23/2023   TRIG 294 (H) 02/23/2023   CHOLHDL 3.5 02/23/2023    Medications Reviewed Today     Reviewed by Manuela Neptune, RPH-CPP (Pharmacist) on 07/04/23 at 1114  Med List Status: <None>   Medication Order Taking? Sig Documenting Provider Last Dose Status Informant  albuterol (VENTOLIN HFA) 108 (90 Base) MCG/ACT inhaler [id number] Yes Inhale 2 puffs into the lungs every 6 (six) hours as needed for wheezing or shortness of breath. Berniece Salines, FNP Taking Active  aspirin 81 MG chewable tablet [id number] Yes Chew 81 mg by mouth daily. [provider] Taking Active   atorvastatin (LIPITOR) 80 MG tablet [id number] Yes Take 1 tablet by mouth daily. [provider] Taking Active   Blood Glucose Monitoring Suppl DEVI [id number]  1 each by Does not apply route in the morning, at  noon, and at bedtime. May substitute to any manufacturer covered by patient's insurance. Danelle Berry, PA-C  Active   Continuous Glucose Sensor (FREESTYLE LIBRE 3 SENSOR) [place] [id number]  1 each by Does not apply route every 14 (fourteen) days. Place 1 sensor on the skin every 14 days. Use to check glucose continuously Berniece Salines, FNP  Active   empagliflozin (JARDIANCE) 25 MG TABS tablet [id number] Yes Take 25 mg by mouth daily. [provider] Taking Active   Glucose Blood (BLOOD GLUCOSE TEST STRIPS) STRP [id number]  1 each by In Vitro route 3 (three) times daily as needed (blood sugar monitoring). May substitute to any manufacturer covered by patient's insurance. Danelle Berry, PA-C  Active   insulin glargine (LANTUS) 100 UNIT/ML Solostar Pen [id number] Yes Inject 10-40 Units into the skin daily.  Patient taking differently: Inject 10 Units into the skin daily.   Danelle Berry, PA-C Taking Active   Investigational - Study Medication [id number] Yes [employer] STUDY [id number], milvexian, or placebo tablet twice daily [provider]  Active   Lancets Misc. MISC [id number]  1 each by Does not apply route 3 (three) times daily as needed (blood sugar monitoring). May substitute to any manufacturer covered by patient's insurance. Danelle Berry, PA-C  Active   lisinopril (ZESTRIL) 10 MG tablet [id number] No Take 1 tablet (10 mg total) by mouth daily.  Patient not taking: Reported on 07/04/2023   Berniece Salines, FNP Not Taking Active   metFORMIN (GLUCOPHAGE) 1000 MG tablet [id number] Yes Take 1,000 mg by mouth 2 (two) times daily. [provider] Taking Active   metoprolol succinate (TOPROL-XL) 50 MG 24 hr tablet [id number] Yes Take 1 tablet by mouth daily. [provider] Taking Active   nitroGLYCERIN (NITROSTAT) 0.4 MG SL tablet [id number]  Place 1 tablet (0.4 mg total) under the tongue every five (5) minutes as needed for chest pain. Maximum of 3 doses in 15  minutes. [provider]  Active   Semaglutide,0.25 or 0.5MG /DOS, (OZEMPIC, 0.25 OR 0.5 MG/DOSE,) 2 MG/3ML SOPN [id number] Yes Inject 0.5 mg into the skin once a week.  Patient taking differently: Inject 1 mg into the skin once a week.   Berniece Salines, FNP Taking Active   ticagrelor (BRILINTA) 90 MG TABS tablet [id number] Yes Take 1 tablet (90 mg total) by mouth two (2) times a day. [provider] Taking Active              Assessment/Plan:   Congratulate patient on cessation of nicotine/vaping and encourage continued cessation  Advise patient to contact office to schedule follow up appointment with PCP  Diabetes: - Reviewed goal A1c, goal fasting, and goal 2 hour post prandial glucose - Reviewed dietary modifications including importance of having regular well-balanced meals and snacks throughout the day, while controlling carbohydrate portion sizes  Advise patient against skipping meals - Recommend to follow up with [employer] with help to restart using Freestyle Libre 3 CGM to monitor blood sugar/as feedback on dietary choices - Recommend to check glucose with fingerstick check when needed for symptoms and as back up to CGM. Patient to contact office  if needed for readings outside of established parameters or symptoms  S/P STEMI/ASCVD Risk Reduction/HTN: - Reviewed appropriate blood pressure monitoring technique and reviewed goal blood pressure.  - Recommend to obtain new battery for monitor and start monitoring home blood pressure, keep log of results and have this record to review at upcoming medical appointments. Patient to contact provider office sooner if needed for readings outside of established parameters or symptoms  Follow Up Plan: Clinical Pharmacist will follow up with patient by telephone on 08/08/2023 at 11:00 AM   Estelle Grumbles, PharmD, [facility] [phone]

## 2023-08-08 ENCOUNTER — Telehealth: Payer: Self-pay | Admitting: Pharmacist

## 2023-08-08 ENCOUNTER — Other Ambulatory Visit: Payer: Self-pay | Admitting: Pharmacist

## 2023-08-08 NOTE — Progress Notes (Signed)
   Outreach Note  08/08/2023 Name: Alexa Dunn MRN: [id number] DOB: 27-Apr-1986  Referred by: Berniece Salines, FNP  Was unable to reach patient via telephone today and have left HIPAA compliant voicemail asking patient to return my call.    Follow Up Plan: Will collaborate with Care Guide to outreach to schedule follow up with me  Estelle Grumbles, PharmD, [facility] [phone]

## 2023-08-16 ENCOUNTER — Telehealth: Payer: Self-pay

## 2023-08-16 NOTE — Progress Notes (Signed)
  Care Coordination Note  08/16/2023 Name: Alexa Dunn MRN: [id number] DOB: 07/17/1986  Alexa Dunn is a 37 y.o. year old female who is a primary care patient of Berniece Salines, FNP and is actively engaged with the Chronic Care Management team. I reached out to Alexa Dunn by phone today to assist with re-scheduling a follow up visit with the Pharmacist  Follow up plan: Unsuccessful telephone outreach attempt made. A HIPAA compliant phone message was left for the patient providing contact information and requesting a return call.   Penne Lash , RMA     [facility]  [facility], [facility] Guide  Direct Dial: [phone]  Website: [email or web address]

## 2023-08-22 NOTE — Progress Notes (Signed)
  Care Coordination Note  08/22/2023 Name: Alexa Dunn MRN: [id number] DOB: Oct 03, 1985  Alexa Dunn is a 37 y.o. year old female who is a primary care patient of Berniece Salines, FNP and is actively engaged with the Chronic Care Management team. I reached out to Alexa Dunn by phone today to assist with re-scheduling a follow up visit with the Pharmacist  Follow up plan: Unsuccessful telephone outreach attempt made. A HIPAA compliant phone message was left for the patient providing contact information and requesting a return call.   Penne Lash , RMA     [facility]  [facility], [facility] Guide  Direct Dial: [phone]  Website: [email or web address]

## 2023-08-30 DIAGNOSIS — I2510 Atherosclerotic heart disease of native coronary artery without angina pectoris: Secondary | ICD-10-CM | POA: Diagnosis not present

## 2023-08-30 DIAGNOSIS — Z006 Encounter for examination for normal comparison and control in clinical research program: Secondary | ICD-10-CM | POA: Diagnosis not present

## 2023-08-30 DIAGNOSIS — E113553 Type 2 diabetes mellitus with stable proliferative diabetic retinopathy, bilateral: Secondary | ICD-10-CM | POA: Diagnosis not present

## 2023-09-19 ENCOUNTER — Telehealth: Payer: Self-pay

## 2023-09-19 NOTE — Progress Notes (Signed)
 Complex Care Management Care Guide Note  09/19/2023 Name: Alexa Dunn MRN: [id number] DOB: Feb 22, 1986  Alexa Dunn is a 38 y.o. year old female who is a primary care patient of Gareth Mliss FALCON, FNP and is actively engaged with the care management team. I reached out to Alexa Dunn by phone today to assist with re-scheduling  with the Pharmacist.  Follow up plan: Unsuccessful telephone outreach attempt made. A HIPAA compliant phone message was left for the patient providing contact information and requesting a return call.  Jeoffrey Buffalo , RMA     [facility]  [facility], [facility] Guide  Direct Dial: [phone]  Website: [email or web address]

## 2023-09-20 IMAGING — MR MR HEAD W/O CM
5 series · 36 of 48 positions shown · non-contrast
Comparison: Prior head CT from 10/21/2021.

CLINICAL DATA: Initial evaluation for acute neuro deficit, stroke
suspected.

EXAM:
MRI HEAD WITHOUT CONTRAST
TECHNIQUE: Multiplanar, multiecho pulse sequences of the brain and surrounding
structures were obtained without intravenous contrast.

[Series 9: ax dwi_tracew · axial · 3.0mm · 0.65mm/px · z∈[-122,+16]mm · 9 of 44 slices shown]
[im 1/44]
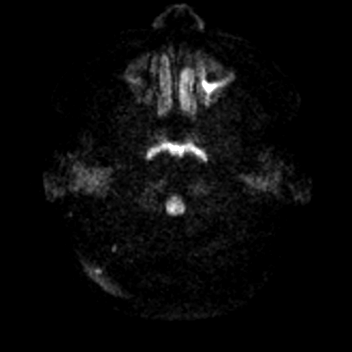
[im 8/44]
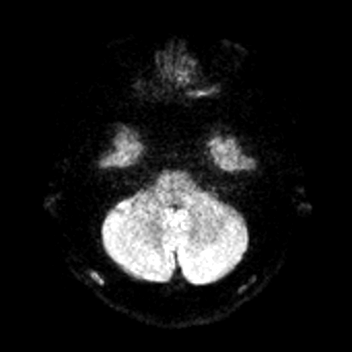
[im 12/44]
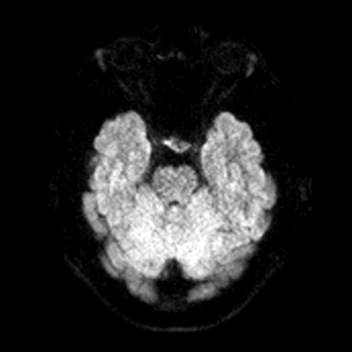
[im 20/44]
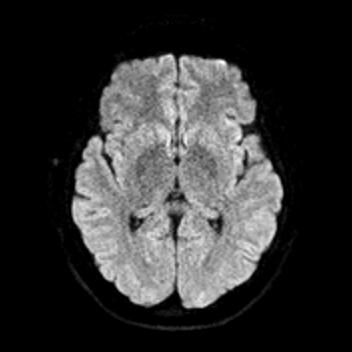
[im 24/44]
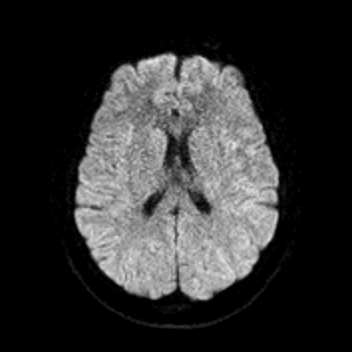
[im 32/44]
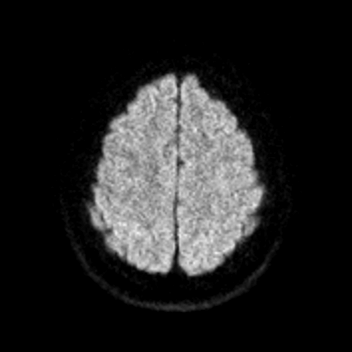
[im 36/44]
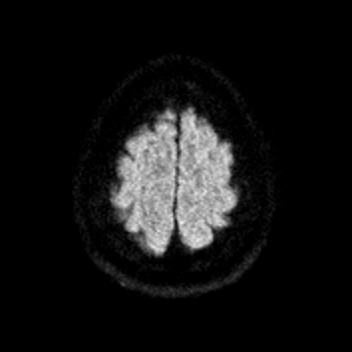
[im 40/44]
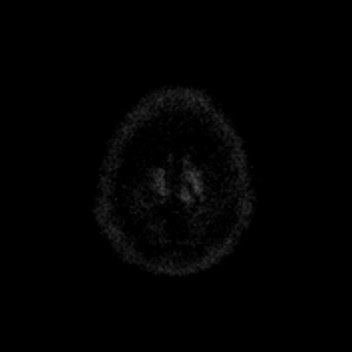
[im 44/44]
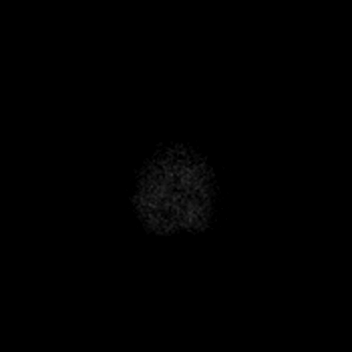

[Series 10: ax dwi_adc · axial · 3.0mm · 0.65mm/px · z∈[-122,+16]mm · 9 of 44 slices shown]
[im 1/44]
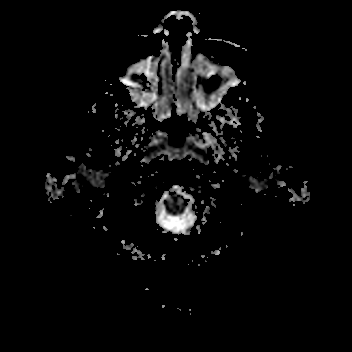
[im 8/44]
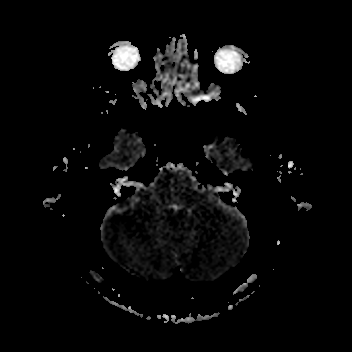
[im 12/44]
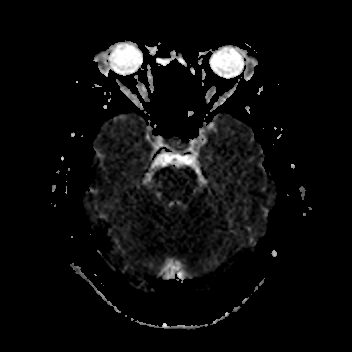
[im 20/44]
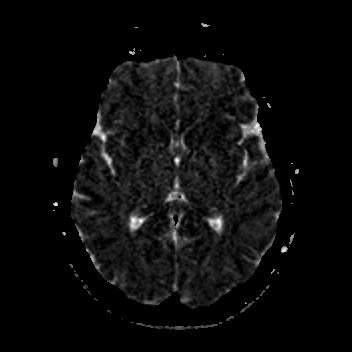
[im 24/44]
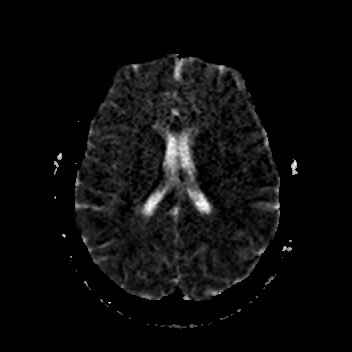
[im 32/44]
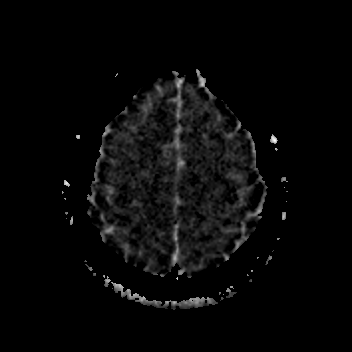
[im 36/44]
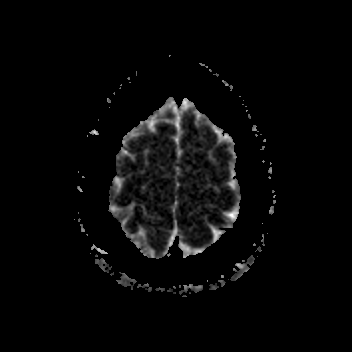
[im 40/44]
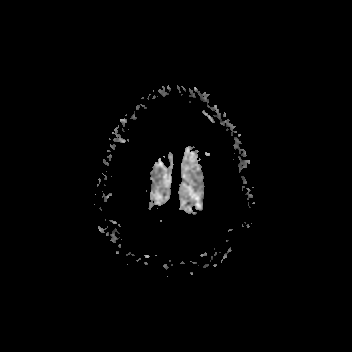
[im 44/44]
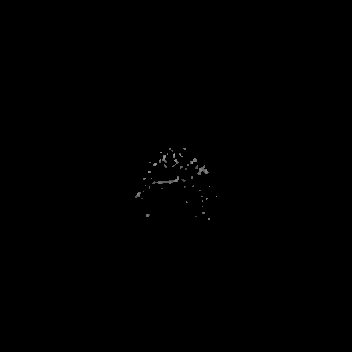

[Series 11: cor dwi_tracew · coronal · 5.0mm · 0.60mm/px · 9 of 36 slices shown]
[im 1/36]
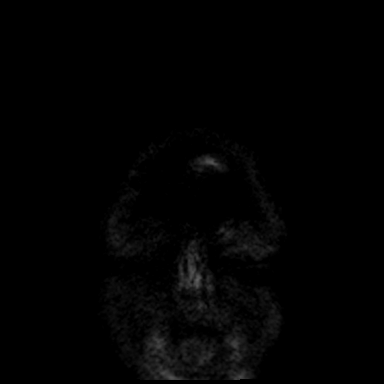
[im 5/36]
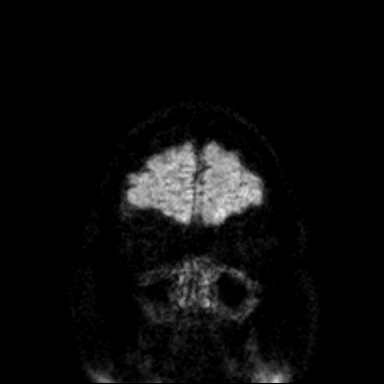
[im 9/36]
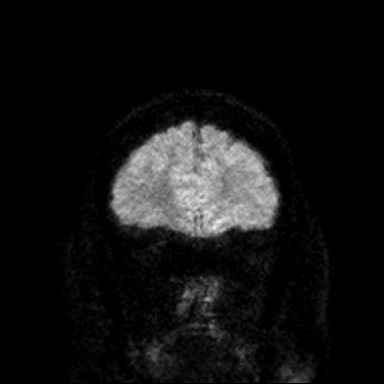
[im 14/36]
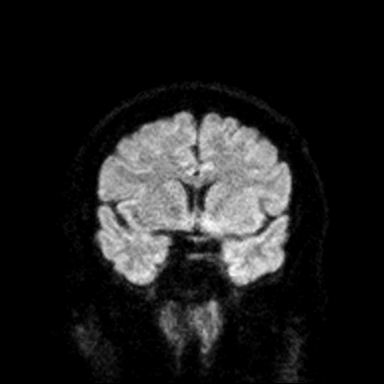
[im 18/36]
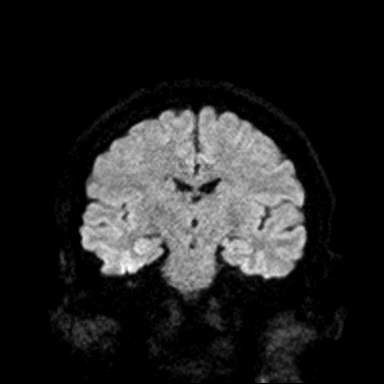
[im 22/36]
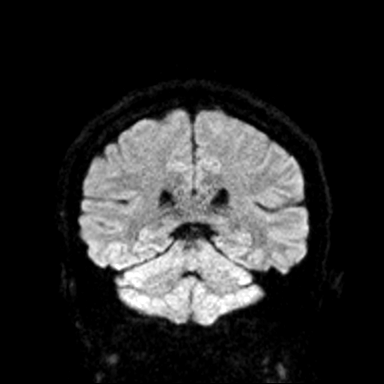
[im 27/36]
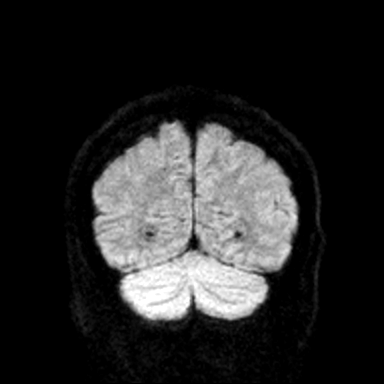
[im 31/36]
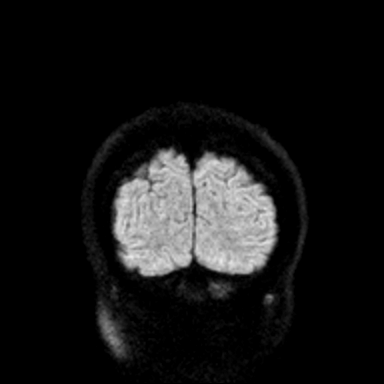
[im 36/36]
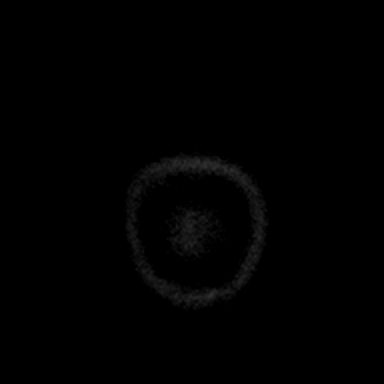

[Series 12: cor dwi_adc · coronal · 5.0mm · 0.60mm/px · 3 of 36 slices shown]
[im 1/36]
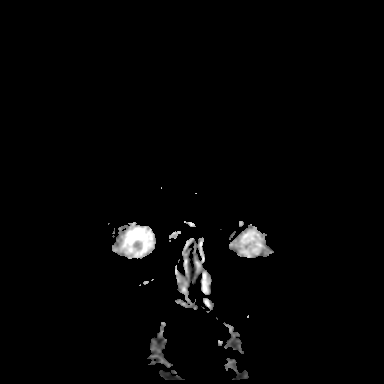
[im 5/36]
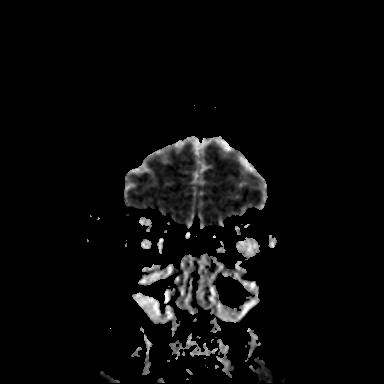
[im 9/36]
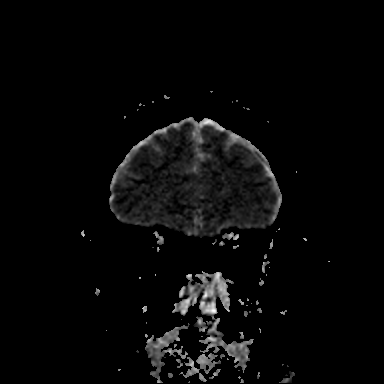

[Series 13: T1 · sagittal · 5.0mm · 0.62mm/px · 6 of 22 slices shown]
[im 1/22]
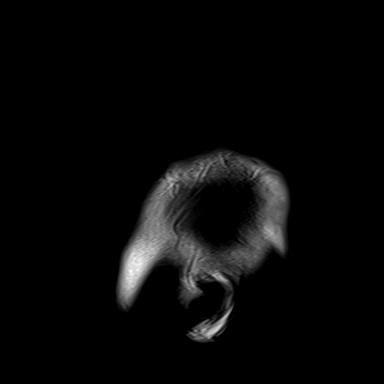
[im 5/22]
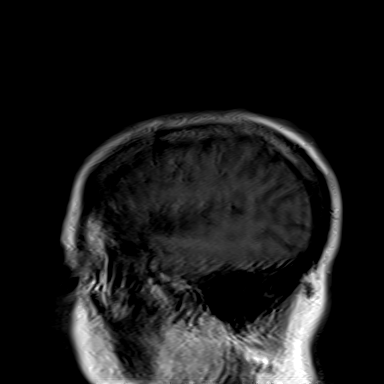
[im 9/22]
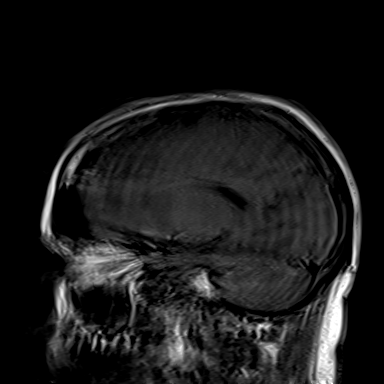
[im 13/22]
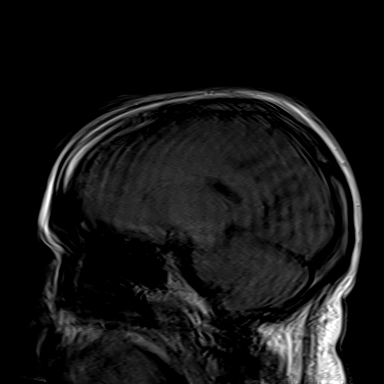
[im 17/22]
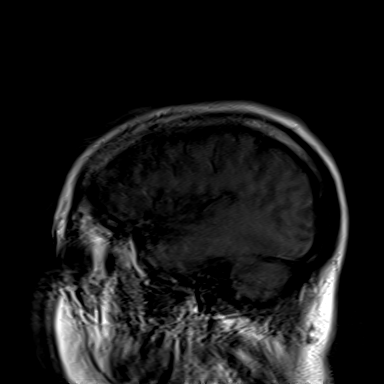
[im 22/22]
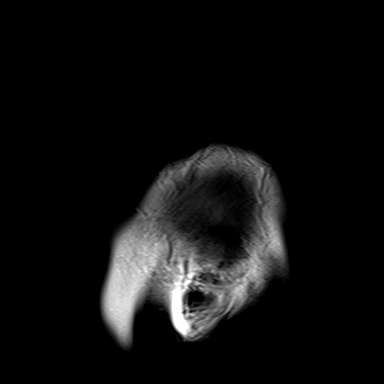

[36 of 48 positions shown; findings below may reference images not displayed]

FINDINGS: Brain: Examination technically limited due to the patient's
inability to tolerate the full length of the exam. Axial coronal DWI
sequences and sagittal T1 weighted sequence only were performed.
Additionally, the provided images are moderately degraded by motion
artifact.

No evidence for acute or subacute infarct. Gray-white matter
differentiation grossly maintained. No visible areas of
encephalomalacia to suggest chronic cortical infarction or other
insult. Cerebral volume within normal limits. No visible mass
lesion, mass effect or midline shift. Ventricles normal size without
hydrocephalus. No extra-axial fluid collection.

Vascular: Not well assessed on this limited exam.

Skull and upper cervical spine: Grossly within normal limits on this
limited exam.

Sinuses/Orbits: Not well assessed on this limited exam.

Other: None.
IMPRESSION: 1. Technically limited exam due to the patient's inability to
tolerate the full length of the study and motion artifact.
2. Grossly negative brain MRI. No acute intracranial infarct or
other abnormality.

## 2023-10-03 ENCOUNTER — Ambulatory Visit: Payer: Medicaid Other | Admitting: Nurse Practitioner

## 2023-10-08 ENCOUNTER — Ambulatory Visit: Payer: Medicaid Other | Admitting: Nurse Practitioner

## 2023-10-08 DIAGNOSIS — I252 Old myocardial infarction: Secondary | ICD-10-CM | POA: Insufficient documentation

## 2023-10-08 DIAGNOSIS — E113392 Type 2 diabetes mellitus with moderate nonproliferative diabetic retinopathy without macular edema, left eye: Secondary | ICD-10-CM | POA: Insufficient documentation

## 2023-10-08 DIAGNOSIS — I25119 Atherosclerotic heart disease of native coronary artery with unspecified angina pectoris: Secondary | ICD-10-CM | POA: Insufficient documentation

## 2023-10-08 NOTE — Progress Notes (Deleted)
   There were no vitals taken for this visit.   Subjective:    Patient ID: Alexa Dunn, female    DOB: 1986/09/02, 38 y.o.   MRN: [id number]  HPI: Alexa Dunn is a 38 y.o. female  No chief complaint on file.   Discussed the use of AI scribe software for clinical note transcription with the patient, who gave verbal consent to proceed.  History of Present Illness           03/14/2023   12:38 PM 02/28/2023   12:47 PM 02/23/2023   10:09 AM  Depression screen PHQ 2/9  Decreased Interest 0 0 0  Down, Depressed, Hopeless 0 0 0  PHQ - 2 Score 0 0 0  Altered sleeping 0 0   Tired, decreased energy 0 0   Change in appetite 0 0   Feeling bad or failure about yourself  0 0   Trouble concentrating 0 0   Moving slowly or fidgety/restless 0 0   Suicidal thoughts 0 0   PHQ-9 Score 0 0   Difficult doing work/chores Not difficult at all Not difficult at all     Relevant past medical, surgical, family and social history reviewed and updated as indicated. Interim medical history since our last visit reviewed. Allergies and medications reviewed and updated.  Review of Systems  Per HPI unless specifically indicated above     Objective:    There were no vitals taken for this visit.  {Vitals History (Optional):23777} Wt Readings from Last 3 Encounters:  04/01/23 205 lb (93 kg)  02/28/23 202 lb 9.6 oz (91.9 kg)  02/23/23 207 lb (93.9 kg)    Physical Exam  Results for orders placed or performed during the hospital encounter of 05/26/23  Basic metabolic panel   Collection Time: 05/26/23  6:31 PM  Result Value Ref Range   Sodium 133 (L) 135 - 145 mmol/L   Potassium 3.5 3.5 - 5.1 mmol/L   Chloride 100 98 - 111 mmol/L   CO2 19 (L) 22 - 32 mmol/L   Glucose, Bld 343 (H) 70 - 99 mg/dL   BUN 11 6 - 20 mg/dL   Creatinine, Ser 4.09 0.44 - 1.00 mg/dL   Calcium 9.4 8.9 - 81.1 mg/dL   GFR, Estimated >91 >47 mL/min   Anion gap 14 5 - 15  CBC   Collection Time: 05/26/23  6:31 PM  Result  Value Ref Range   WBC 11.8 (H) 4.0 - 10.5 K/uL   RBC 5.31 (H) 3.87 - 5.11 MIL/uL   Hemoglobin 14.4 12.0 - 15.0 g/dL   HCT 82.9 56.2 - 13.0 %   MCV 79.8 (L) 80.0 - 100.0 fL   MCH 27.1 26.0 - 34.0 pg   MCHC 34.0 30.0 - 36.0 g/dL   RDW 86.5 78.4 - 69.6 %   Platelets 331 150 - 400 K/uL   nRBC 0.0 0.0 - 0.2 %  Troponin I (High Sensitivity)   Collection Time: 05/26/23  6:31 PM  Result Value Ref Range   Troponin I (High Sensitivity) 177 (HH) <18 ng/L   {Labs (Optional):23779}    Assessment & Plan:   Problem List Items Addressed This Visit   None    Assessment and Plan             Follow up plan: No follow-ups on file.

## 2023-10-11 DIAGNOSIS — F17210 Nicotine dependence, cigarettes, uncomplicated: Secondary | ICD-10-CM | POA: Diagnosis not present

## 2023-10-11 DIAGNOSIS — I252 Old myocardial infarction: Secondary | ICD-10-CM | POA: Diagnosis not present

## 2023-10-11 DIAGNOSIS — E11319 Type 2 diabetes mellitus with unspecified diabetic retinopathy without macular edema: Secondary | ICD-10-CM | POA: Diagnosis not present

## 2023-10-11 DIAGNOSIS — I2510 Atherosclerotic heart disease of native coronary artery without angina pectoris: Secondary | ICD-10-CM | POA: Diagnosis not present

## 2023-10-11 DIAGNOSIS — Z794 Long term (current) use of insulin: Secondary | ICD-10-CM | POA: Diagnosis not present

## 2023-10-11 DIAGNOSIS — Z7984 Long term (current) use of oral hypoglycemic drugs: Secondary | ICD-10-CM | POA: Diagnosis not present

## 2023-10-11 DIAGNOSIS — Z7902 Long term (current) use of antithrombotics/antiplatelets: Secondary | ICD-10-CM | POA: Diagnosis not present

## 2023-10-11 DIAGNOSIS — I5022 Chronic systolic (congestive) heart failure: Secondary | ICD-10-CM | POA: Diagnosis not present

## 2023-10-11 DIAGNOSIS — Z7982 Long term (current) use of aspirin: Secondary | ICD-10-CM | POA: Diagnosis not present

## 2023-10-11 DIAGNOSIS — I110 Hypertensive heart disease with heart failure: Secondary | ICD-10-CM | POA: Diagnosis not present

## 2023-10-11 DIAGNOSIS — Z7985 Long-term (current) use of injectable non-insulin antidiabetic drugs: Secondary | ICD-10-CM | POA: Diagnosis not present

## 2023-10-11 DIAGNOSIS — R030 Elevated blood-pressure reading, without diagnosis of hypertension: Secondary | ICD-10-CM | POA: Diagnosis not present

## 2023-10-11 DIAGNOSIS — R079 Chest pain, unspecified: Secondary | ICD-10-CM | POA: Diagnosis not present

## 2023-10-11 DIAGNOSIS — T82855A Stenosis of coronary artery stent, initial encounter: Secondary | ICD-10-CM | POA: Diagnosis not present

## 2023-10-11 DIAGNOSIS — Z955 Presence of coronary angioplasty implant and graft: Secondary | ICD-10-CM | POA: Diagnosis not present

## 2023-10-11 DIAGNOSIS — R0602 Shortness of breath: Secondary | ICD-10-CM | POA: Diagnosis not present

## 2023-10-11 DIAGNOSIS — Z880 Allergy status to penicillin: Secondary | ICD-10-CM | POA: Diagnosis not present

## 2023-10-11 DIAGNOSIS — E785 Hyperlipidemia, unspecified: Secondary | ICD-10-CM | POA: Diagnosis not present

## 2023-10-11 DIAGNOSIS — R0789 Other chest pain: Secondary | ICD-10-CM | POA: Diagnosis not present

## 2023-10-11 DIAGNOSIS — I214 Non-ST elevation (NSTEMI) myocardial infarction: Secondary | ICD-10-CM | POA: Diagnosis not present

## 2023-10-12 DIAGNOSIS — Z79899 Other long term (current) drug therapy: Secondary | ICD-10-CM | POA: Diagnosis not present

## 2023-10-12 DIAGNOSIS — R0789 Other chest pain: Secondary | ICD-10-CM | POA: Diagnosis not present

## 2023-10-12 DIAGNOSIS — I110 Hypertensive heart disease with heart failure: Secondary | ICD-10-CM | POA: Diagnosis not present

## 2023-10-12 DIAGNOSIS — I3139 Other pericardial effusion (noninflammatory): Secondary | ICD-10-CM | POA: Diagnosis not present

## 2023-10-12 DIAGNOSIS — E11319 Type 2 diabetes mellitus with unspecified diabetic retinopathy without macular edema: Secondary | ICD-10-CM | POA: Diagnosis not present

## 2023-10-12 DIAGNOSIS — R079 Chest pain, unspecified: Secondary | ICD-10-CM | POA: Diagnosis not present

## 2023-10-12 DIAGNOSIS — Z7982 Long term (current) use of aspirin: Secondary | ICD-10-CM | POA: Diagnosis not present

## 2023-10-12 DIAGNOSIS — I5020 Unspecified systolic (congestive) heart failure: Secondary | ICD-10-CM | POA: Diagnosis not present

## 2023-10-12 DIAGNOSIS — I252 Old myocardial infarction: Secondary | ICD-10-CM | POA: Diagnosis not present

## 2023-10-12 DIAGNOSIS — Z955 Presence of coronary angioplasty implant and graft: Secondary | ICD-10-CM | POA: Diagnosis not present

## 2023-10-12 DIAGNOSIS — E876 Hypokalemia: Secondary | ICD-10-CM | POA: Diagnosis not present

## 2023-10-12 DIAGNOSIS — I214 Non-ST elevation (NSTEMI) myocardial infarction: Secondary | ICD-10-CM | POA: Diagnosis not present

## 2023-10-12 DIAGNOSIS — I2510 Atherosclerotic heart disease of native coronary artery without angina pectoris: Secondary | ICD-10-CM | POA: Diagnosis not present

## 2023-10-13 DIAGNOSIS — F17200 Nicotine dependence, unspecified, uncomplicated: Secondary | ICD-10-CM | POA: Diagnosis not present

## 2023-10-13 DIAGNOSIS — F32A Depression, unspecified: Secondary | ICD-10-CM | POA: Diagnosis not present

## 2023-10-13 DIAGNOSIS — J45909 Unspecified asthma, uncomplicated: Secondary | ICD-10-CM | POA: Diagnosis not present

## 2023-10-13 DIAGNOSIS — E1165 Type 2 diabetes mellitus with hyperglycemia: Secondary | ICD-10-CM | POA: Diagnosis not present

## 2023-10-13 DIAGNOSIS — I10 Essential (primary) hypertension: Secondary | ICD-10-CM | POA: Diagnosis not present

## 2023-10-13 DIAGNOSIS — R079 Chest pain, unspecified: Secondary | ICD-10-CM | POA: Diagnosis not present

## 2023-10-13 DIAGNOSIS — Z6832 Body mass index (BMI) 32.0-32.9, adult: Secondary | ICD-10-CM | POA: Diagnosis not present

## 2023-10-13 DIAGNOSIS — E119 Type 2 diabetes mellitus without complications: Secondary | ICD-10-CM | POA: Diagnosis not present

## 2023-10-13 DIAGNOSIS — Z794 Long term (current) use of insulin: Secondary | ICD-10-CM | POA: Diagnosis not present

## 2023-10-13 DIAGNOSIS — I214 Non-ST elevation (NSTEMI) myocardial infarction: Secondary | ICD-10-CM | POA: Diagnosis not present

## 2023-10-13 DIAGNOSIS — E669 Obesity, unspecified: Secondary | ICD-10-CM | POA: Diagnosis not present

## 2023-10-15 ENCOUNTER — Telehealth: Payer: Self-pay

## 2023-10-15 NOTE — Transitions of Care (Post Inpatient/ED Visit) (Signed)
   10/15/2023  Name: Alexa Dunn MRN: [id number] DOB: [birth year]  Today's TOC FU Call Status: Today's TOC FU Call Status:: Unsuccessful Call (1st Attempt) Unsuccessful Call (1st Attempt) Date: 10/15/23  Attempted to reach the patient regarding the most recent Inpatient/ED visit. Patient was called in an Outreach attempt to offer VBCI  30-day TOC program. Pt is eligible for program due to potential risk for readmission and/or high utilization. Unfortunately, I was not able to speak with the patient in regards to reported recent hospital discharge from [facility]   Patient's voicemail has  a generic greeting. To maintain HIPAA compliance, left message with VBCI CM contact information only  and a request for a call back .  Follow Up Plan: Additional outreach attempts will be made to reach the patient to complete the Transitions of Care (Post Inpatient/ED visit) call.     Susa Loffler , BSN, RN [facility]   VBCI-Population Health RN Care Manager Direct Dial [phone]  Fax: [phone] Website: [email or web address]

## 2023-10-16 ENCOUNTER — Telehealth: Payer: Self-pay

## 2023-10-16 DIAGNOSIS — R079 Chest pain, unspecified: Secondary | ICD-10-CM | POA: Diagnosis not present

## 2023-10-16 NOTE — Transitions of Care (Post Inpatient/ED Visit) (Signed)
   10/16/2023  Name: Alexa Dunn MRN: [id number] DOB: March 24, 1986  Today's TOC FU Call Status: Today's TOC FU Call Status:: Unsuccessful Call (2nd Attempt) Unsuccessful Call (1st Attempt) Date: 10/15/23 Unsuccessful Call (2nd Attempt) Date: 10/16/23  Attempted to reach the patient regarding the most recent Inpatient/ED visit.  Patient was called in an Outreach attempt to offer VBCI  30-day TOC program. Pt is eligible for program due to potential risk for readmission and/or high utilization. Unfortunately, I was not able to speak with the patient in regards to recent hospital discharge     Patient's voicemail has  a generic greeting. To maintain HIPAA compliance, left message with VBCI CM contact information only  and a request for a call back .   Follow Up Plan: Additional outreach attempts will be made to reach the patient to complete the Transitions of Care (Post Inpatient/ED visit) call.   Bari Mayans , BSN, RN [facility]   VBCI-Population Health RN Care Manager Direct Dial [phone]  Fax: [phone] Website: [email or web address]

## 2023-10-17 ENCOUNTER — Telehealth: Payer: Self-pay

## 2023-10-17 NOTE — Transitions of Care (Post Inpatient/ED Visit) (Signed)
   10/17/2023  Name: Alexa Dunn MRN: [id number] DOB: [birth year]  Today's TOC FU Call Status: Today's TOC FU Call Status:: Unsuccessful Call (3rd Attempt) Unsuccessful Call (3rd Attempt) Date: 10/17/23  Attempted to reach the patient regarding the most recent Inpatient/ED visit.  Patient's voicemail has  a generic greeting. To maintain HIPAA compliance, left message with VBCI CM contact information for today only and a request for a call back .   Follow Up Plan: No further outreach attempts will be made at this time. We have been unable to contact the patient.  HIPAA compliant confidential voice mail left with call back number for today only. Of note: [facility] TOC patient advocate outreach was attempted as well on 10/16/23 unsuccessfully. Patient was contacted by [facility] Guide unsuccessfully on 09/19/2023 noted in encounters.   Bing Edison MSN, RN RN [facility]  VBCI-Population Health Office Hours: Wed/Thur  8:00 am-6:00 pm Direct Dial: [phone] Main Phone [phone]  Fax: [phone] [email or web address]

## 2023-12-04 DIAGNOSIS — E113532 Type 2 diabetes mellitus with proliferative diabetic retinopathy with traction retinal detachment not involving the macula, left eye: Secondary | ICD-10-CM | POA: Diagnosis not present

## 2023-12-04 DIAGNOSIS — H3342 Traction detachment of retina, left eye: Secondary | ICD-10-CM | POA: Diagnosis not present

## 2023-12-04 DIAGNOSIS — I10 Essential (primary) hypertension: Secondary | ICD-10-CM | POA: Diagnosis not present

## 2024-01-28 DIAGNOSIS — Z1159 Encounter for screening for other viral diseases: Secondary | ICD-10-CM | POA: Diagnosis not present

## 2024-01-28 DIAGNOSIS — Z114 Encounter for screening for human immunodeficiency virus [HIV]: Secondary | ICD-10-CM | POA: Diagnosis not present

## 2024-01-28 DIAGNOSIS — E785 Hyperlipidemia, unspecified: Secondary | ICD-10-CM | POA: Diagnosis not present

## 2024-01-28 DIAGNOSIS — Z113 Encounter for screening for infections with a predominantly sexual mode of transmission: Secondary | ICD-10-CM | POA: Diagnosis not present

## 2024-01-28 DIAGNOSIS — E1159 Type 2 diabetes mellitus with other circulatory complications: Secondary | ICD-10-CM | POA: Diagnosis not present

## 2024-01-28 DIAGNOSIS — R739 Hyperglycemia, unspecified: Secondary | ICD-10-CM | POA: Diagnosis not present

## 2024-01-28 DIAGNOSIS — I10 Essential (primary) hypertension: Secondary | ICD-10-CM | POA: Diagnosis not present

## 2024-01-28 DIAGNOSIS — Z720 Tobacco use: Secondary | ICD-10-CM | POA: Diagnosis not present

## 2024-01-28 DIAGNOSIS — E119 Type 2 diabetes mellitus without complications: Secondary | ICD-10-CM | POA: Diagnosis not present

## 2024-01-28 DIAGNOSIS — I2510 Atherosclerotic heart disease of native coronary artery without angina pectoris: Secondary | ICD-10-CM | POA: Diagnosis not present

## 2024-01-28 DIAGNOSIS — R079 Chest pain, unspecified: Secondary | ICD-10-CM | POA: Diagnosis not present

## 2024-01-28 DIAGNOSIS — Z794 Long term (current) use of insulin: Secondary | ICD-10-CM | POA: Diagnosis not present

## 2024-01-29 DIAGNOSIS — I25110 Atherosclerotic heart disease of native coronary artery with unstable angina pectoris: Secondary | ICD-10-CM | POA: Diagnosis not present

## 2024-01-29 DIAGNOSIS — I10 Essential (primary) hypertension: Secondary | ICD-10-CM | POA: Diagnosis not present

## 2024-01-29 DIAGNOSIS — Z794 Long term (current) use of insulin: Secondary | ICD-10-CM | POA: Diagnosis not present

## 2024-01-29 DIAGNOSIS — E119 Type 2 diabetes mellitus without complications: Secondary | ICD-10-CM | POA: Diagnosis not present

## 2024-01-29 DIAGNOSIS — Z955 Presence of coronary angioplasty implant and graft: Secondary | ICD-10-CM | POA: Diagnosis not present

## 2024-01-29 DIAGNOSIS — R079 Chest pain, unspecified: Secondary | ICD-10-CM | POA: Diagnosis not present

## 2024-01-29 DIAGNOSIS — F17290 Nicotine dependence, other tobacco product, uncomplicated: Secondary | ICD-10-CM | POA: Diagnosis not present

## 2024-01-29 DIAGNOSIS — I25119 Atherosclerotic heart disease of native coronary artery with unspecified angina pectoris: Secondary | ICD-10-CM | POA: Diagnosis not present

## 2024-01-30 DIAGNOSIS — I10 Essential (primary) hypertension: Secondary | ICD-10-CM | POA: Diagnosis not present

## 2024-01-30 DIAGNOSIS — E119 Type 2 diabetes mellitus without complications: Secondary | ICD-10-CM | POA: Diagnosis not present

## 2024-01-30 DIAGNOSIS — R079 Chest pain, unspecified: Secondary | ICD-10-CM | POA: Diagnosis not present

## 2024-01-30 DIAGNOSIS — I25118 Atherosclerotic heart disease of native coronary artery with other forms of angina pectoris: Secondary | ICD-10-CM | POA: Diagnosis not present

## 2024-01-30 DIAGNOSIS — Z794 Long term (current) use of insulin: Secondary | ICD-10-CM | POA: Diagnosis not present

## 2024-01-31 ENCOUNTER — Telehealth: Payer: Self-pay

## 2024-01-31 NOTE — Transitions of Care (Post Inpatient/ED Visit) (Unsigned)
   01/31/2024  Name: Alexa Dunn MRN: [id number] DOB: June 02, 1986  Today's TOC FU Call Status: Today's TOC FU Call Status:: Unsuccessful Call (1st Attempt) Unsuccessful Call (1st Attempt) Date: 01/31/24  Attempted to reach the patient regarding the most recent Inpatient/ED visit.  Follow Up Plan: Additional outreach attempts will be made to reach the patient to complete the Transitions of Care (Post Inpatient/ED visit) call.   Signature Darrall Ellison, LPN [facility] Nurse Health Advisor Direct Dial [phone]

## 2024-02-05 ENCOUNTER — Inpatient Hospital Stay: Admitting: Nurse Practitioner

## 2024-02-05 DIAGNOSIS — D509 Iron deficiency anemia, unspecified: Secondary | ICD-10-CM | POA: Insufficient documentation

## 2024-02-05 NOTE — Transitions of Care (Post Inpatient/ED Visit) (Signed)
   02/05/2024  Name: Alexa Dunn MRN: [id number] DOB: 1986/07/23  Today's TOC FU Call Status: Today's TOC FU Call Status:: Unsuccessful Call (1st Attempt) Unsuccessful Call (1st Attempt) Date: 01/31/24  Attempted to reach the patient regarding the most recent Inpatient/ED visit.  Follow Up Plan: No further outreach attempts will be made at this time. We have been unable to contact the patient. Patient already seen in office Signature Darrall Ellison, LPN [facility] Nurse Health Advisor Direct Dial [phone]

## 2024-02-05 NOTE — Progress Notes (Deleted)
 There were no vitals taken for this visit.   Subjective:    Patient ID: Alexa Dunn, female    DOB: 01/11/1986, 38 y.o.   MRN: [id number]  HPI: Alexa Dunn is a 38 y.o. female  No chief complaint on file.   Discussed the use of AI scribe software for clinical note transcription with the patient, who gave verbal consent to proceed.  History of Present Illness     Admit Date: 01/28/2024  Discharge Date: 01/30/2024   Discharge To: Home  Discharge Service: [facility] - Cardiology Floor Team 1 (MEDC1)   Discharge Attending Physician: Wendelin Halsted, MD  Discharge Diagnoses:  Principal Problem: Chest pain (POA: Yes) Active Problems: T2DM (type 2 diabetes mellitus) (POA: Yes) Hypertension (POA: Yes) Tobacco use disorder (POA: Yes) History of ST elevation myocardial infarction (STEMI) (POA: Not Applicable) Coronary artery disease involving native coronary artery of native heart with angina pectoris (POA: Yes) Resolved Problems: * No resolved hospital problems. *  Outpatient Provider Follow Up Issues:  [ ]  Concern for inability to pay for medications, but given 90 days of medication in hand prior to discharge. Ensure compliance. [ ]  Follow up BP - increased dose of lisinopril  to 20mg  prior to discharge [ ]  Labs consistent with iron deficiency, consider outpatient iron supplementation  Hospital Course:  Alexa Dunn is a 38 y.o. female with past medical history of STEMI s/p PCI x 2 (LAD and OM1 in 05/2023) and s/p POBA to 90% ISR LAD (10/2023), HTN, HLD, T2DM who presented to [facility] with 1 week of chest pain requiring increasing doses of prn nitroglycerin.   Chest Pain  Hx of STEMI (s/p PCI to the LAD/OMI) 2024  Hx of ISR of LAD 2025  CAD Patient presented from PCP to [facility] with one week of worsening chest pain, present at rest and with exertion, with increasing use of prn nitroglycerin. She did not take any of her medications over the last month 2/2 access difficulties,  and was taking her ticagrelor every other day over the last 2 weeks. Patient also discontinued metropolol at home 2/2 side effects of fatigue. On her arrival, EKG was NSR, Pro-BNP was normal, and troponins were flat. Ticagrelor was restarted and metropolol 25 mg was added. Due to patient being high risk, she underwent LHC which showed severe disease of LCx, LAD, and RCA, unchanged from prior echo on 09/2023. Echo was also performed this admission which was unchanged from prior, LVEF 60-65%. Imdur 30 mg was added following catheterization, and she was tolerating metoprolol succinate 25mg  nightly. Continue DAPT (ticagrelor and ASA) in addition to atorvastatin 80mg  and zetia 10mg  at discharge. Plan for cardiology follow up within 2-3 months.  T2DM  Patient presented with hyperglycemia to 400s, and A1c this admission elevated to 10.5. A1c has ranged from 11.1 to upper 10s since 2017. Her home regimen includes Ozempic  1mg  weekly, Lantus /Levemir 10U nightly, Metformin  1000mg  BID, and Jardiance  25mg  daily. She was out of the medications for at least 1 month. Lantus  was restarted during admission. Plan to discharge patient with 90 day course of all medications with 1 refill for management as well as follow up with PCP.   HTN Patient presented with blood pressure that has ranged from 120-160s systolic in the setting of not taking medications for the last month. Was previously on lisinopril  10 mg daily which was increased to 20 mg daily. Patient developed nausea and lightheadedness after increased dose was administered; however, blood pressure remained stable.  Discharge on increased dose and monitor symptoms at home with close outpatient follow up.  Tobacco Use Disorder  Vape Use Patient was counseled on smoking cessation while admitted. She stopped smoking October 2023 and now only vapes occasionally. SW provided patient with information about outpatient Treatment Program at [facility] and [place]  Quitline.   Discharge Medications:   Your Medication List    STOP taking these medications   insulin  glargine 100 unit/mL injection Commonly known as: LANTUS      START taking these medications   isosorbide mononitrate 30 MG 24 hr tablet Commonly known as: IMDUR Take 1 tablet (30 mg total) by mouth daily. Start taking on: Jan 31, 2024  metoPROLOL succinate 25 MG 24 hr tablet Commonly known as: Toprol-XL Take 1 tablet (25 mg total) by mouth nightly.     CHANGE how you take these medications   lisinopril  20 MG tablet Commonly known as: PRINIVIL ,ZESTRIL  Take 1 tablet (20 mg total) by mouth daily. Start taking on: Jan 31, 2024 What changed:  medication strength how much to take  ticagrelor 90 mg Tab Commonly known as: BRILINTA Take 1 tablet (90 mg total) by mouth two (2) times a day. What changed: when to take this     CONTINUE taking these medications   albuterol  90 mcg/actuation inhaler Commonly known as: PROVENTIL  HFA;VENTOLIN  HFA Inhale 2 puffs every four (4) hours as needed for wheezing.  aspirin 81 MG chewable tablet Chew 1 tablet (81 mg total) daily.  atorvastatin 80 MG tablet Commonly known as: LIPITOR Take 1 tablet (80 mg total) by mouth daily.  blood-glucose meter kit Use as instructed  empagliflozin  25 mg tablet Commonly known as: JARDIANCE  Take 1 tablet (25 mg total) by mouth daily.  ezetimibe 10 mg tablet Commonly known as: ZETIA Take 1 tablet (10 mg total) by mouth daily.  gabapentin 300 MG capsule Commonly known as: NEURONTIN Take 1 capsule (300 mg total) by mouth as needed.  glucose blood test strip Generic drug: blood sugar diagnostic Use to check blood sugar once daily and as needed if symptoms of hypoglycemia arise  insulin  detemir U-100 100 unit/mL (3 mL) injection pen Commonly known as: LEVEMIR Inject 0.1 mL (10 Units total) under the skin daily.  lancets 30 gauge Misc 1 Units by Miscellaneous route  daily.  metFORMIN  1000 MG tablet Commonly known as: GLUCOPHAGE  Take 1 tablet (1,000 mg total) by mouth in the morning and 1 tablet (1,000 mg total) in the evening. Take with meals.  nitroglycerin 0.4 MG SL tablet Commonly known as: NITROSTAT Place 1 tablet (0.4 mg total) under the tongue every five (5) minutes as needed for chest pain. Maximum of 3 doses in 15 minutes.  OZEMPIC  2 mg/dose (8 mg/3 mL) Pnij Generic drug: semaglutide  Inject 2 mg under the skin every seven (7) days.  tizanidine 2 MG capsule Commonly known as: ZANAFLEX Take 1 capsule (2 mg total) by mouth as needed for muscle spasms.        03/14/2023   12:38 PM 02/28/2023   12:47 PM 02/23/2023   10:09 AM  Depression screen PHQ 2/9  Decreased Interest 0 0 0  Down, Depressed, Hopeless 0 0 0  PHQ - 2 Score 0 0 0  Altered sleeping 0 0   Tired, decreased energy 0 0   Change in appetite 0 0   Feeling bad or failure about yourself  0 0   Trouble concentrating 0 0   Moving slowly or fidgety/restless 0 0  Suicidal thoughts 0 0   PHQ-9 Score 0 0   Difficult doing work/chores Not difficult at all Not difficult at all     Relevant past medical, surgical, family and social history reviewed and updated as indicated. Interim medical history since our last visit reviewed. Allergies and medications reviewed and updated.  Review of Systems  Per HPI unless specifically indicated above     Objective:      There were no vitals taken for this visit.  {Vitals History (Optional):23777} Wt Readings from Last 3 Encounters:  04/01/23 205 lb (93 kg)  02/28/23 202 lb 9.6 oz (91.9 kg)  02/23/23 207 lb (93.9 kg)    Physical Exam Physical Exam    Results for orders placed or performed during the hospital encounter of 05/26/23  Basic metabolic panel   Collection Time: 05/26/23  6:31 PM  Result Value Ref Range   Sodium 133 (L) 135 - 145 mmol/L   Potassium 3.5 3.5 - 5.1 mmol/L   Chloride 100 98 - 111 mmol/L   CO2 19 (L) 22  - 32 mmol/L   Glucose, Bld 343 (H) 70 - 99 mg/dL   BUN 11 6 - 20 mg/dL   Creatinine, Ser 1.61 0.44 - 1.00 mg/dL   Calcium 9.4 8.9 - 09.6 mg/dL   GFR, Estimated >04 >54 mL/min   Anion gap 14 5 - 15  CBC   Collection Time: 05/26/23  6:31 PM  Result Value Ref Range   WBC 11.8 (H) 4.0 - 10.5 K/uL   RBC 5.31 (H) 3.87 - 5.11 MIL/uL   Hemoglobin 14.4 12.0 - 15.0 g/dL   HCT 09.8 11.9 - 14.7 %   MCV 79.8 (L) 80.0 - 100.0 fL   MCH 27.1 26.0 - 34.0 pg   MCHC 34.0 30.0 - 36.0 g/dL   RDW 82.9 56.2 - 13.0 %   Platelets 331 150 - 400 K/uL   nRBC 0.0 0.0 - 0.2 %  Troponin I (High Sensitivity)   Collection Time: 05/26/23  6:31 PM  Result Value Ref Range   Troponin I (High Sensitivity) 177 (HH) <18 ng/L   {Labs (Optional):23779}       Assessment & Plan:   Problem List Items Addressed This Visit   None    Assessment and Plan Assessment & Plan         Follow up plan: No follow-ups on file.

## 2024-02-13 DIAGNOSIS — I2510 Atherosclerotic heart disease of native coronary artery without angina pectoris: Secondary | ICD-10-CM | POA: Diagnosis not present

## 2024-02-14 ENCOUNTER — Other Ambulatory Visit (HOSPITAL_COMMUNITY): Payer: Self-pay

## 2024-02-29 DIAGNOSIS — I214 Non-ST elevation (NSTEMI) myocardial infarction: Secondary | ICD-10-CM | POA: Diagnosis not present

## 2024-02-29 DIAGNOSIS — I25119 Atherosclerotic heart disease of native coronary artery with unspecified angina pectoris: Secondary | ICD-10-CM | POA: Diagnosis not present

## 2024-02-29 DIAGNOSIS — Z794 Long term (current) use of insulin: Secondary | ICD-10-CM | POA: Diagnosis not present

## 2024-02-29 DIAGNOSIS — I10 Essential (primary) hypertension: Secondary | ICD-10-CM | POA: Diagnosis not present

## 2024-02-29 DIAGNOSIS — R252 Cramp and spasm: Secondary | ICD-10-CM | POA: Diagnosis not present

## 2024-02-29 DIAGNOSIS — E1159 Type 2 diabetes mellitus with other circulatory complications: Secondary | ICD-10-CM | POA: Diagnosis not present

## 2024-03-31 DIAGNOSIS — I2510 Atherosclerotic heart disease of native coronary artery without angina pectoris: Secondary | ICD-10-CM | POA: Diagnosis not present

## 2024-03-31 DIAGNOSIS — Z794 Long term (current) use of insulin: Secondary | ICD-10-CM | POA: Diagnosis not present

## 2024-03-31 DIAGNOSIS — E1159 Type 2 diabetes mellitus with other circulatory complications: Secondary | ICD-10-CM | POA: Diagnosis not present

## 2024-03-31 DIAGNOSIS — I10 Essential (primary) hypertension: Secondary | ICD-10-CM | POA: Diagnosis not present

## 2024-03-31 DIAGNOSIS — Z9861 Coronary angioplasty status: Secondary | ICD-10-CM | POA: Diagnosis not present

## 2024-04-03 DIAGNOSIS — I2510 Atherosclerotic heart disease of native coronary artery without angina pectoris: Secondary | ICD-10-CM | POA: Diagnosis not present

## 2024-04-17 DIAGNOSIS — I10 Essential (primary) hypertension: Secondary | ICD-10-CM | POA: Diagnosis not present

## 2024-04-17 DIAGNOSIS — Z794 Long term (current) use of insulin: Secondary | ICD-10-CM | POA: Diagnosis not present

## 2024-04-17 DIAGNOSIS — I252 Old myocardial infarction: Secondary | ICD-10-CM | POA: Diagnosis not present

## 2024-04-17 DIAGNOSIS — Z9861 Coronary angioplasty status: Secondary | ICD-10-CM | POA: Diagnosis not present

## 2024-04-17 DIAGNOSIS — E785 Hyperlipidemia, unspecified: Secondary | ICD-10-CM | POA: Diagnosis not present

## 2024-04-17 DIAGNOSIS — E1159 Type 2 diabetes mellitus with other circulatory complications: Secondary | ICD-10-CM | POA: Diagnosis not present

## 2024-04-17 DIAGNOSIS — I739 Peripheral vascular disease, unspecified: Secondary | ICD-10-CM | POA: Diagnosis not present

## 2024-04-17 DIAGNOSIS — I2510 Atherosclerotic heart disease of native coronary artery without angina pectoris: Secondary | ICD-10-CM | POA: Diagnosis not present

## 2024-05-15 DIAGNOSIS — Z7985 Long-term (current) use of injectable non-insulin antidiabetic drugs: Secondary | ICD-10-CM | POA: Diagnosis not present

## 2024-05-15 DIAGNOSIS — Z98891 History of uterine scar from previous surgery: Secondary | ICD-10-CM | POA: Diagnosis not present

## 2024-05-15 DIAGNOSIS — E785 Hyperlipidemia, unspecified: Secondary | ICD-10-CM | POA: Diagnosis not present

## 2024-05-15 DIAGNOSIS — N179 Acute kidney failure, unspecified: Secondary | ICD-10-CM | POA: Diagnosis not present

## 2024-05-15 DIAGNOSIS — Z79899 Other long term (current) drug therapy: Secondary | ICD-10-CM | POA: Diagnosis not present

## 2024-05-15 DIAGNOSIS — I129 Hypertensive chronic kidney disease with stage 1 through stage 4 chronic kidney disease, or unspecified chronic kidney disease: Secondary | ICD-10-CM | POA: Diagnosis not present

## 2024-05-15 DIAGNOSIS — Z716 Tobacco abuse counseling: Secondary | ICD-10-CM | POA: Diagnosis not present

## 2024-05-15 DIAGNOSIS — Z794 Long term (current) use of insulin: Secondary | ICD-10-CM | POA: Diagnosis not present

## 2024-05-15 DIAGNOSIS — F17210 Nicotine dependence, cigarettes, uncomplicated: Secondary | ICD-10-CM | POA: Diagnosis not present

## 2024-05-15 DIAGNOSIS — J45909 Unspecified asthma, uncomplicated: Secondary | ICD-10-CM | POA: Diagnosis not present

## 2024-05-15 DIAGNOSIS — Z7982 Long term (current) use of aspirin: Secondary | ICD-10-CM | POA: Diagnosis not present

## 2024-05-15 DIAGNOSIS — I2510 Atherosclerotic heart disease of native coronary artery without angina pectoris: Secondary | ICD-10-CM | POA: Diagnosis not present

## 2024-05-15 DIAGNOSIS — I214 Non-ST elevation (NSTEMI) myocardial infarction: Secondary | ICD-10-CM | POA: Diagnosis not present

## 2024-05-15 DIAGNOSIS — I252 Old myocardial infarction: Secondary | ICD-10-CM | POA: Diagnosis not present

## 2024-05-15 DIAGNOSIS — F17290 Nicotine dependence, other tobacco product, uncomplicated: Secondary | ICD-10-CM | POA: Diagnosis not present

## 2024-05-15 DIAGNOSIS — R0789 Other chest pain: Secondary | ICD-10-CM | POA: Diagnosis not present

## 2024-05-15 DIAGNOSIS — Z7902 Long term (current) use of antithrombotics/antiplatelets: Secondary | ICD-10-CM | POA: Diagnosis not present

## 2024-05-15 DIAGNOSIS — R079 Chest pain, unspecified: Secondary | ICD-10-CM | POA: Diagnosis not present

## 2024-05-15 DIAGNOSIS — E1122 Type 2 diabetes mellitus with diabetic chronic kidney disease: Secondary | ICD-10-CM | POA: Diagnosis not present

## 2024-05-15 DIAGNOSIS — D649 Anemia, unspecified: Secondary | ICD-10-CM | POA: Diagnosis not present

## 2024-05-15 DIAGNOSIS — E1165 Type 2 diabetes mellitus with hyperglycemia: Secondary | ICD-10-CM | POA: Diagnosis not present

## 2024-05-15 DIAGNOSIS — N183 Chronic kidney disease, stage 3 unspecified: Secondary | ICD-10-CM | POA: Diagnosis not present

## 2024-05-15 DIAGNOSIS — Z880 Allergy status to penicillin: Secondary | ICD-10-CM | POA: Diagnosis not present

## 2024-05-16 DIAGNOSIS — R079 Chest pain, unspecified: Secondary | ICD-10-CM | POA: Diagnosis not present

## 2024-05-16 DIAGNOSIS — I10 Essential (primary) hypertension: Secondary | ICD-10-CM | POA: Diagnosis not present

## 2024-05-16 DIAGNOSIS — I252 Old myocardial infarction: Secondary | ICD-10-CM | POA: Diagnosis not present

## 2024-05-16 DIAGNOSIS — I214 Non-ST elevation (NSTEMI) myocardial infarction: Secondary | ICD-10-CM | POA: Diagnosis not present

## 2024-05-16 DIAGNOSIS — F17210 Nicotine dependence, cigarettes, uncomplicated: Secondary | ICD-10-CM | POA: Diagnosis not present

## 2024-05-16 DIAGNOSIS — I25110 Atherosclerotic heart disease of native coronary artery with unstable angina pectoris: Secondary | ICD-10-CM | POA: Diagnosis not present

## 2024-05-16 DIAGNOSIS — E785 Hyperlipidemia, unspecified: Secondary | ICD-10-CM | POA: Diagnosis not present

## 2024-05-16 DIAGNOSIS — I25118 Atherosclerotic heart disease of native coronary artery with other forms of angina pectoris: Secondary | ICD-10-CM | POA: Diagnosis not present

## 2024-05-16 DIAGNOSIS — E1165 Type 2 diabetes mellitus with hyperglycemia: Secondary | ICD-10-CM | POA: Diagnosis not present

## 2024-05-16 DIAGNOSIS — Z955 Presence of coronary angioplasty implant and graft: Secondary | ICD-10-CM | POA: Diagnosis not present

## 2024-05-16 DIAGNOSIS — E119 Type 2 diabetes mellitus without complications: Secondary | ICD-10-CM | POA: Diagnosis not present

## 2024-05-17 DIAGNOSIS — N183 Chronic kidney disease, stage 3 unspecified: Secondary | ICD-10-CM | POA: Diagnosis not present

## 2024-05-17 DIAGNOSIS — I214 Non-ST elevation (NSTEMI) myocardial infarction: Secondary | ICD-10-CM | POA: Diagnosis not present

## 2024-05-17 DIAGNOSIS — R079 Chest pain, unspecified: Secondary | ICD-10-CM | POA: Diagnosis not present

## 2024-05-17 DIAGNOSIS — I25110 Atherosclerotic heart disease of native coronary artery with unstable angina pectoris: Secondary | ICD-10-CM | POA: Diagnosis not present

## 2024-05-17 DIAGNOSIS — E1065 Type 1 diabetes mellitus with hyperglycemia: Secondary | ICD-10-CM | POA: Diagnosis not present

## 2024-05-17 DIAGNOSIS — E1022 Type 1 diabetes mellitus with diabetic chronic kidney disease: Secondary | ICD-10-CM | POA: Diagnosis not present

## 2024-05-17 DIAGNOSIS — E1165 Type 2 diabetes mellitus with hyperglycemia: Secondary | ICD-10-CM | POA: Diagnosis not present

## 2024-05-18 DIAGNOSIS — E1022 Type 1 diabetes mellitus with diabetic chronic kidney disease: Secondary | ICD-10-CM | POA: Diagnosis not present

## 2024-05-18 DIAGNOSIS — I25110 Atherosclerotic heart disease of native coronary artery with unstable angina pectoris: Secondary | ICD-10-CM | POA: Diagnosis not present

## 2024-05-18 DIAGNOSIS — E1165 Type 2 diabetes mellitus with hyperglycemia: Secondary | ICD-10-CM | POA: Diagnosis not present

## 2024-05-18 DIAGNOSIS — E1065 Type 1 diabetes mellitus with hyperglycemia: Secondary | ICD-10-CM | POA: Diagnosis not present

## 2024-05-18 DIAGNOSIS — R079 Chest pain, unspecified: Secondary | ICD-10-CM | POA: Diagnosis not present

## 2024-05-18 DIAGNOSIS — N183 Chronic kidney disease, stage 3 unspecified: Secondary | ICD-10-CM | POA: Diagnosis not present

## 2024-05-18 DIAGNOSIS — I214 Non-ST elevation (NSTEMI) myocardial infarction: Secondary | ICD-10-CM | POA: Diagnosis not present

## 2024-05-21 DIAGNOSIS — I739 Peripheral vascular disease, unspecified: Secondary | ICD-10-CM | POA: Diagnosis not present

## 2024-05-27 DIAGNOSIS — I2510 Atherosclerotic heart disease of native coronary artery without angina pectoris: Secondary | ICD-10-CM | POA: Diagnosis not present

## 2024-05-27 DIAGNOSIS — E785 Hyperlipidemia, unspecified: Secondary | ICD-10-CM | POA: Diagnosis not present

## 2024-05-27 DIAGNOSIS — J45909 Unspecified asthma, uncomplicated: Secondary | ICD-10-CM | POA: Diagnosis not present

## 2024-05-27 DIAGNOSIS — Z7902 Long term (current) use of antithrombotics/antiplatelets: Secondary | ICD-10-CM | POA: Diagnosis not present

## 2024-05-27 DIAGNOSIS — Z833 Family history of diabetes mellitus: Secondary | ICD-10-CM | POA: Diagnosis not present

## 2024-05-27 DIAGNOSIS — Z7982 Long term (current) use of aspirin: Secondary | ICD-10-CM | POA: Diagnosis not present

## 2024-05-27 DIAGNOSIS — Z7984 Long term (current) use of oral hypoglycemic drugs: Secondary | ICD-10-CM | POA: Diagnosis not present

## 2024-05-27 DIAGNOSIS — Z7985 Long-term (current) use of injectable non-insulin antidiabetic drugs: Secondary | ICD-10-CM | POA: Diagnosis not present

## 2024-05-27 DIAGNOSIS — E1365 Other specified diabetes mellitus with hyperglycemia: Secondary | ICD-10-CM | POA: Diagnosis not present

## 2024-05-27 DIAGNOSIS — Z880 Allergy status to penicillin: Secondary | ICD-10-CM | POA: Diagnosis not present

## 2024-05-27 DIAGNOSIS — E1065 Type 1 diabetes mellitus with hyperglycemia: Secondary | ICD-10-CM | POA: Diagnosis not present

## 2024-05-27 DIAGNOSIS — Z79899 Other long term (current) drug therapy: Secondary | ICD-10-CM | POA: Diagnosis not present

## 2024-05-27 DIAGNOSIS — Z793 Long term (current) use of hormonal contraceptives: Secondary | ICD-10-CM | POA: Diagnosis not present

## 2024-05-27 DIAGNOSIS — F17290 Nicotine dependence, other tobacco product, uncomplicated: Secondary | ICD-10-CM | POA: Diagnosis not present

## 2024-05-27 DIAGNOSIS — Z794 Long term (current) use of insulin: Secondary | ICD-10-CM | POA: Diagnosis not present

## 2024-05-27 DIAGNOSIS — E13319 Other specified diabetes mellitus with unspecified diabetic retinopathy without macular edema: Secondary | ICD-10-CM | POA: Diagnosis not present

## 2024-05-27 DIAGNOSIS — E1321 Other specified diabetes mellitus with diabetic nephropathy: Secondary | ICD-10-CM | POA: Diagnosis not present

## 2024-05-27 DIAGNOSIS — I252 Old myocardial infarction: Secondary | ICD-10-CM | POA: Diagnosis not present

## 2024-05-27 DIAGNOSIS — E1340 Other specified diabetes mellitus with diabetic neuropathy, unspecified: Secondary | ICD-10-CM | POA: Diagnosis not present

## 2024-06-09 DIAGNOSIS — Z9861 Coronary angioplasty status: Secondary | ICD-10-CM | POA: Diagnosis not present

## 2024-06-09 DIAGNOSIS — I2510 Atherosclerotic heart disease of native coronary artery without angina pectoris: Secondary | ICD-10-CM | POA: Diagnosis not present

## 2024-06-13 DIAGNOSIS — N200 Calculus of kidney: Secondary | ICD-10-CM | POA: Diagnosis not present

## 2024-06-13 DIAGNOSIS — K429 Umbilical hernia without obstruction or gangrene: Secondary | ICD-10-CM | POA: Diagnosis not present

## 2024-06-13 DIAGNOSIS — M47819 Spondylosis without myelopathy or radiculopathy, site unspecified: Secondary | ICD-10-CM | POA: Diagnosis not present

## 2024-06-13 DIAGNOSIS — N838 Other noninflammatory disorders of ovary, fallopian tube and broad ligament: Secondary | ICD-10-CM | POA: Diagnosis not present

## 2024-06-13 DIAGNOSIS — I739 Peripheral vascular disease, unspecified: Secondary | ICD-10-CM | POA: Diagnosis not present

## 2024-06-13 DIAGNOSIS — J9811 Atelectasis: Secondary | ICD-10-CM | POA: Diagnosis not present

## 2024-06-18 DIAGNOSIS — M79604 Pain in right leg: Secondary | ICD-10-CM | POA: Diagnosis not present

## 2024-06-18 DIAGNOSIS — M79605 Pain in left leg: Secondary | ICD-10-CM | POA: Diagnosis not present

## 2024-06-20 ENCOUNTER — Telehealth: Payer: Self-pay | Admitting: *Deleted

## 2024-06-20 DIAGNOSIS — J4520 Mild intermittent asthma, uncomplicated: Secondary | ICD-10-CM

## 2024-06-20 DIAGNOSIS — E113392 Type 2 diabetes mellitus with moderate nonproliferative diabetic retinopathy without macular edema, left eye: Secondary | ICD-10-CM

## 2024-06-20 DIAGNOSIS — I10 Essential (primary) hypertension: Secondary | ICD-10-CM

## 2024-06-20 NOTE — Progress Notes (Signed)
 Complex Care Management Note Care Guide Note  06/20/2024 Name: Alexa Dunn MRN: [id number] DOB: 07/15/1986   Complex Care Management Outreach Attempts: An unsuccessful telephone outreach was attempted today to offer the patient information about available complex care management services.  Follow Up Plan:  Additional outreach attempts will be made to offer the patient complex care management information and services.   Encounter Outcome:  No Answer  Harlene Satterfield  [facility]  [facility], [facility] Guide  Direct Dial: [phone]  Fax [phone]

## 2024-07-01 NOTE — Progress Notes (Unsigned)
 Complex Care Management Note Care Guide Note  07/01/2024 Name: Alexa Dunn MRN: [id number] DOB: 01/10/1986   Complex Care Management Outreach Attempts: A second unsuccessful outreach was attempted today to offer the patient with information about available complex care management services.  Follow Up Plan:  Additional outreach attempts will be made to offer the patient complex care management information and services.   Encounter Outcome:  No Answer  Harlene Satterfield  [facility]  [facility] Guide  Direct Dial: [phone]  Fax [phone]

## 2024-07-02 NOTE — Progress Notes (Signed)
 Complex Care Management Note Care Guide Note  07/02/2024 Name: MORGAINE KIMBALL MRN: [id number] DOB: [birth year]   Complex Care Management Outreach Attempts: A third unsuccessful outreach was attempted today to offer the patient with information about available complex care management services.  Follow Up Plan:  No further outreach attempts will be made at this time. We have been unable to contact the patient to offer or enroll patient in complex care management services.  Encounter Outcome:  No Answer  Harlene Satterfield  [facility]  [facility], [facility] Guide  Direct Dial: [phone]  Fax [phone]

## 2024-07-03 DIAGNOSIS — Z3046 Encounter for surveillance of implantable subdermal contraceptive: Secondary | ICD-10-CM | POA: Diagnosis not present

## 2024-07-03 DIAGNOSIS — Z0000 Encounter for general adult medical examination without abnormal findings: Secondary | ICD-10-CM | POA: Diagnosis not present

## 2024-07-03 DIAGNOSIS — Z01419 Encounter for gynecological examination (general) (routine) without abnormal findings: Secondary | ICD-10-CM | POA: Diagnosis not present

## 2024-07-16 DIAGNOSIS — Z955 Presence of coronary angioplasty implant and graft: Secondary | ICD-10-CM | POA: Diagnosis not present

## 2024-07-16 DIAGNOSIS — H25811 Combined forms of age-related cataract, right eye: Secondary | ICD-10-CM | POA: Diagnosis not present

## 2024-08-31 DIAGNOSIS — M79674 Pain in right toe(s): Secondary | ICD-10-CM | POA: Diagnosis not present

## 2024-09-09 DIAGNOSIS — H4311 Vitreous hemorrhage, right eye: Secondary | ICD-10-CM | POA: Diagnosis not present
